# Patient Record
Sex: Female | Born: 1995 | Race: Black or African American | Hispanic: No | Smoking: Never smoker
Health system: Southern US, Community
[De-identification: ages and names within clinical notes are randomized; demographics above are authoritative.]

## PROBLEM LIST (undated history)

## (undated) DIAGNOSIS — D649 Anemia, unspecified: Secondary | ICD-10-CM

## (undated) DIAGNOSIS — Z113 Encounter for screening for infections with a predominantly sexual mode of transmission: Secondary | ICD-10-CM

## (undated) DIAGNOSIS — R102 Pelvic and perineal pain: Secondary | ICD-10-CM

## (undated) DIAGNOSIS — A749 Chlamydial infection, unspecified: Secondary | ICD-10-CM

## (undated) DIAGNOSIS — J020 Streptococcal pharyngitis: Secondary | ICD-10-CM

## (undated) DIAGNOSIS — R030 Elevated blood-pressure reading, without diagnosis of hypertension: Secondary | ICD-10-CM

## (undated) DIAGNOSIS — M67439 Ganglion, unspecified wrist: Secondary | ICD-10-CM

## (undated) DIAGNOSIS — O98819 Other maternal infectious and parasitic diseases complicating pregnancy, unspecified trimester: Secondary | ICD-10-CM

## (undated) DIAGNOSIS — Z3490 Encounter for supervision of normal pregnancy, unspecified, unspecified trimester: Secondary | ICD-10-CM

## (undated) DIAGNOSIS — N898 Other specified noninflammatory disorders of vagina: Secondary | ICD-10-CM

## (undated) DIAGNOSIS — B379 Candidiasis, unspecified: Secondary | ICD-10-CM

## (undated) DIAGNOSIS — J329 Chronic sinusitis, unspecified: Secondary | ICD-10-CM

## (undated) HISTORY — DX: Chronic sinusitis, unspecified: J32.9

## (undated) HISTORY — PX: NO PAST SURGERIES: SHX2092

## (undated) HISTORY — DX: Pelvic and perineal pain: R10.2

## (undated) HISTORY — DX: Elevated blood-pressure reading, without diagnosis of hypertension: R03.0

## (undated) HISTORY — DX: Ganglion, unspecified wrist: M67.439

## (undated) HISTORY — DX: Other specified noninflammatory disorders of vagina: N89.8

## (undated) HISTORY — DX: Other maternal infectious and parasitic diseases complicating pregnancy, unspecified trimester: O98.819

## (undated) HISTORY — DX: Chlamydial infection, unspecified: A74.9

## (undated) HISTORY — DX: Encounter for supervision of normal pregnancy, unspecified, unspecified trimester: Z34.90

## (undated) HISTORY — DX: Candidiasis, unspecified: B37.9

## (undated) HISTORY — DX: Encounter for screening for infections with a predominantly sexual mode of transmission: Z11.3

---
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
## 2013-01-13 ENCOUNTER — Other Ambulatory Visit: Payer: Self-pay | Admitting: Obstetrics & Gynecology

## 2013-01-13 DIAGNOSIS — O3680X0 Pregnancy with inconclusive fetal viability, not applicable or unspecified: Secondary | ICD-10-CM

## 2013-01-16 ENCOUNTER — Other Ambulatory Visit: Payer: Self-pay | Admitting: Obstetrics & Gynecology

## 2013-01-16 ENCOUNTER — Ambulatory Visit (INDEPENDENT_AMBULATORY_CARE_PROVIDER_SITE_OTHER): Payer: Medicaid Other

## 2013-01-16 DIAGNOSIS — Z1389 Encounter for screening for other disorder: Secondary | ICD-10-CM

## 2013-01-16 DIAGNOSIS — O0932 Supervision of pregnancy with insufficient antenatal care, second trimester: Secondary | ICD-10-CM

## 2013-01-16 DIAGNOSIS — O26849 Uterine size-date discrepancy, unspecified trimester: Secondary | ICD-10-CM

## 2013-01-16 DIAGNOSIS — O3680X0 Pregnancy with inconclusive fetal viability, not applicable or unspecified: Secondary | ICD-10-CM

## 2013-01-16 DIAGNOSIS — O0930 Supervision of pregnancy with insufficient antenatal care, unspecified trimester: Secondary | ICD-10-CM

## 2013-01-16 NOTE — Progress Notes (Signed)
U/S-active fetus, meas c/w 15+6wks EDD 07/04/2013, cx long and closed 4.2cm, bilateral adnexa WNL, post gr 0 plac, fluid WNL, complete anatomy at next visit

## 2013-01-20 ENCOUNTER — Emergency Department (HOSPITAL_COMMUNITY)
Admission: EM | Admit: 2013-01-20 | Discharge: 2013-01-20 | Disposition: A | Discharge status: Home / Self Care | Payer: Medicaid Other | Attending: Emergency Medicine | Admitting: Emergency Medicine

## 2013-01-20 ENCOUNTER — Encounter (HOSPITAL_COMMUNITY): Payer: Self-pay | Admitting: *Deleted

## 2013-01-20 ENCOUNTER — Telehealth: Payer: Self-pay | Admitting: Nurse Practitioner

## 2013-01-20 DIAGNOSIS — Z8739 Personal history of other diseases of the musculoskeletal system and connective tissue: Secondary | ICD-10-CM | POA: Insufficient documentation

## 2013-01-20 DIAGNOSIS — O9989 Other specified diseases and conditions complicating pregnancy, childbirth and the puerperium: Secondary | ICD-10-CM | POA: Insufficient documentation

## 2013-01-20 DIAGNOSIS — R1032 Left lower quadrant pain: Secondary | ICD-10-CM | POA: Insufficient documentation

## 2013-01-20 DIAGNOSIS — R109 Unspecified abdominal pain: Secondary | ICD-10-CM

## 2013-01-20 DIAGNOSIS — Z79899 Other long term (current) drug therapy: Secondary | ICD-10-CM | POA: Insufficient documentation

## 2013-01-20 DIAGNOSIS — Z87440 Personal history of urinary (tract) infections: Secondary | ICD-10-CM | POA: Insufficient documentation

## 2013-01-20 DIAGNOSIS — Z8669 Personal history of other diseases of the nervous system and sense organs: Secondary | ICD-10-CM | POA: Insufficient documentation

## 2013-01-20 LAB — URINALYSIS, ROUTINE W REFLEX MICROSCOPIC
Bilirubin Urine: NEGATIVE
Glucose, UA: NEGATIVE mg/dL
Hgb urine dipstick: NEGATIVE
Specific Gravity, Urine: 1.025 (ref 1.005–1.030)
pH: 6 (ref 5.0–8.0)

## 2013-01-20 LAB — URINE MICROSCOPIC-ADD ON

## 2013-01-20 NOTE — ED Notes (Signed)
Pt with lower abd pain since 0500 this morning, denies N/V/D, pt also [redacted] weeks pregnant, denies burning on urination or vaginal bleeding/ discharge

## 2013-01-20 NOTE — ED Provider Notes (Signed)
CSN: [id number]     Arrival date & time 01/20/13  2024 History  This chart was scribed for Benny Lennert, MD by Ronal Fear, ED Scribe. This patient was seen in room APA12/APA12 and the patient's care was started at 9:52 PM.      Chief Complaint  Patient presents with  . Abdominal Pain    Patient is a 17 y.o. female presenting with abdominal pain. The history is provided by the patient. No language interpreter was used.  Abdominal Pain Pain location:  LLQ Pain radiates to:  Does not radiate Pain severity:  Mild Onset quality:  Sudden Duration:  16 hours Timing:  Constant Progression:  Unchanged Chronicity:  New Context: awakening from sleep   Relieved by:  None tried Worsened by:  Nothing tried Ineffective treatments:  None tried Associated symptoms: no chest pain, no cough, no diarrhea, no dysuria, no fatigue, no hematuria, no nausea, no vaginal bleeding, no vaginal discharge and no vomiting   Risk factors: pregnancy    HPI Comments: Gayna Braddy is a 17 y.o. female who presents to the Emergency Department complaining of gradually worsening, constant, moderate LLQ abdominal pain onset at 5 am, about 17 hours ago. Pt reports that she is [redacted] weeks pregnant, and she states that she came to the ED to ensure that he pregnancy is normal. She expresses that she has a history of UTIs. She denies nausea, vomiting, diarrhea, dysuria, vaginal bleeding and vaginal discharge.  PCP: Rudi Heap   Past Medical History  Diagnosis Date  . Neuromuscular disorder   . Ganglion cyst of wrist     left   History reviewed. No pertinent past surgical history. History reviewed. No pertinent family history. History  Substance Use Topics  . Smoking status: Never Smoker   . Smokeless tobacco: Not on file  . Alcohol Use: No   OB History   Grav Para Term Preterm Abortions TAB SAB Ect Mult Living   1              Review of Systems  Constitutional: Negative for appetite change and fatigue.   HENT: Negative for congestion, sinus pressure and ear discharge.   Eyes: Negative for discharge.  Respiratory: Negative for cough.   Cardiovascular: Negative for chest pain.  Gastrointestinal: Positive for abdominal pain. Negative for nausea, vomiting and diarrhea.  Genitourinary: Negative for dysuria, frequency, hematuria, vaginal bleeding and vaginal discharge.  Musculoskeletal: Negative for back pain.  Skin: Negative for rash.  Neurological: Negative for seizures and headaches.  Psychiatric/Behavioral: Negative for hallucinations.    Allergies  Review of patient's allergies indicates no known allergies.  Home Medications   Current Outpatient Rx  Name  Route  Sig  Dispense  Refill  . Prenatal Vit-Fe Fumarate-FA (PRENATAL VITAMINS PLUS) 27-1 MG TABS   Oral   Take 1 tablet by mouth daily.   30 tablet   11    Triage Vitals: BP 115/58  Pulse 92  Temp(Src) 98.6 F (37 C) (Oral)  Resp 16  Ht 5\' 3"  (1.6 m)  Wt 145 lb (65.772 kg)  BMI 25.69 kg/m2  SpO2 100%  LMP 10/08/2012  Physical Exam  Nursing note and vitals reviewed. Constitutional: She is oriented to person, place, and time. She appears well-developed and well-nourished. No distress.  HENT:  Head: Normocephalic and atraumatic.  Eyes: EOM are normal.  Neck: Neck supple. No tracheal deviation present.  Cardiovascular: Normal rate.   Pulmonary/Chest: Effort normal. No respiratory distress.  Abdominal: There is  tenderness (minimal LLQ tenderness).  Musculoskeletal: Normal range of motion.  Neurological: She is alert and oriented to person, place, and time.  Skin: Skin is warm and dry.  Psychiatric: She has a normal mood and affect. Her behavior is normal.    ED Course  Procedures (including critical care time)  DIAGNOSTIC STUDIES: Oxygen Saturation is 100% on RA, normal by my interpretation.    COORDINATION OF CARE: 9:56 PM- Pt advised of plan for treatment including UA and pt agrees.   Labs Review Labs  Reviewed  URINALYSIS, ROUTINE W REFLEX MICROSCOPIC - Abnormal; Notable for the following:    Leukocytes, UA TRACE (*)    All other components within normal limits  URINE MICROSCOPIC-ADD ON - Abnormal; Notable for the following:    Squamous Epithelial / LPF FEW (*)    Bacteria, UA FEW (*)    All other components within normal limits   Imaging Review No results found. Pt with nl [place] by md in office.   MDM  No diagnosis found. abd pain with preg.  Nl [place] in office.  Pt to follow up with ob this week.   Pain improved by discharge  The chart was scribed for me under my direct supervision.  I personally performed the history, physical, and medical decision making and all procedures in the evaluation of this patient.Benny Lennert, MD 01/20/13 [phone]

## 2013-01-20 NOTE — ED Notes (Signed)
Pt states history of UTI. Complaining of lower abdominal pain. FHR 140

## 2013-01-20 NOTE — ED Notes (Signed)
Pt alert & oriented x4, stable gait. Patient given discharge instructions, paperwork & prescription(s). Patient  instructed to stop at the registration desk to finish any additional paperwork. Patient verbalized understanding. Pt left department w/ no further questions. 

## 2013-01-22 LAB — URINE CULTURE

## 2013-01-28 ENCOUNTER — Encounter: Payer: Medicaid Other | Admitting: Advanced Practice Midwife

## 2013-01-28 ENCOUNTER — Other Ambulatory Visit: Payer: Medicaid Other

## 2013-01-28 NOTE — Telephone Encounter (Signed)
No call back from 8-26. Patient will contact office if appt still needed

## 2013-03-10 ENCOUNTER — Encounter: Payer: Self-pay | Admitting: Women's Health

## 2013-03-10 ENCOUNTER — Ambulatory Visit (INDEPENDENT_AMBULATORY_CARE_PROVIDER_SITE_OTHER): Payer: Medicaid Other | Admitting: Women's Health

## 2013-03-10 VITALS — BP 124/50 | Wt 160.0 lb

## 2013-03-10 DIAGNOSIS — Z1389 Encounter for screening for other disorder: Secondary | ICD-10-CM

## 2013-03-10 DIAGNOSIS — O99891 Other specified diseases and conditions complicating pregnancy: Secondary | ICD-10-CM

## 2013-03-10 DIAGNOSIS — Z3400 Encounter for supervision of normal first pregnancy, unspecified trimester: Secondary | ICD-10-CM | POA: Insufficient documentation

## 2013-03-10 DIAGNOSIS — O0930 Supervision of pregnancy with insufficient antenatal care, unspecified trimester: Secondary | ICD-10-CM

## 2013-03-10 DIAGNOSIS — Z3402 Encounter for supervision of normal first pregnancy, second trimester: Secondary | ICD-10-CM

## 2013-03-10 DIAGNOSIS — O0932 Supervision of pregnancy with insufficient antenatal care, second trimester: Secondary | ICD-10-CM

## 2013-03-10 DIAGNOSIS — R350 Frequency of micturition: Secondary | ICD-10-CM

## 2013-03-10 DIAGNOSIS — Z331 Pregnant state, incidental: Secondary | ICD-10-CM

## 2013-03-10 DIAGNOSIS — O2390 Unspecified genitourinary tract infection in pregnancy, unspecified trimester: Secondary | ICD-10-CM

## 2013-03-10 NOTE — Patient Instructions (Addendum)
You will have your sugar test next visit.  Please do not eat or drink anything after midnight the night before you come, not even water.  You will be here for at least two hours.    Pregnancy - Second Trimester The second trimester of pregnancy (3 to 6 months) is a period of rapid growth for you and your baby. At the end of the sixth month, your baby is about 9 inches long and weighs 1 1/2 pounds. You will begin to feel the baby move between 18 and 20 weeks of the pregnancy. This is called quickening. Weight gain is faster. A clear fluid (colostrum) may leak out of your breasts. You may feel small contractions of the womb (uterus). This is known as false labor or Braxton-Hicks contractions. This is like a practice for labor when the baby is ready to be born. Usually, the problems with morning sickness have usually passed by the end of your first trimester. Some women develop small dark blotches (called cholasma, mask of pregnancy) on their face that usually goes away after the baby is born. Exposure to the sun makes the blotches worse. Acne may also develop in some pregnant women and pregnant women who have acne, may find that it goes away. PRENATAL EXAMS  Blood work may continue to be done during prenatal exams. These tests are done to check on your health and the probable health of your baby. Blood work is used to follow your blood levels (hemoglobin). Anemia (low hemoglobin) is common during pregnancy. Iron and vitamins are given to help prevent this. You will also be checked for diabetes between 24 and 28 weeks of the pregnancy. Some of the previous blood tests may be repeated.  The size of the uterus is measured during each visit. This is to make sure that the baby is continuing to grow properly according to the dates of the pregnancy.  Your blood pressure is checked every prenatal visit. This is to make sure you are not getting toxemia.  Your urine is checked to make sure you do not have an  infection, diabetes or protein in the urine.  Your weight is checked often to make sure gains are happening at the suggested rate. This is to ensure that both you and your baby are growing normally.  Sometimes, an ultrasound is performed to confirm the proper growth and development of the baby. This is a test which bounces harmless sound waves off the baby so your caregiver can more accurately determine due dates. Sometimes, a test is done on the amniotic fluid surrounding the baby. This test is called an amniocentesis. The amniotic fluid is obtained by sticking a needle into the belly (abdomen). This is done to check the chromosomes in instances where there is a concern about possible genetic problems with the baby. It is also sometimes done near the end of pregnancy if an early delivery is required. In this case, it is done to help make sure the baby's lungs are mature enough for the baby to live outside of the womb. CHANGES OCCURING IN THE SECOND TRIMESTER OF PREGNANCY Your body goes through many changes during pregnancy. They vary from person to person. Talk to your caregiver about changes you notice that you are concerned about.  During the second trimester, you will likely have an increase in your appetite. It is normal to have cravings for certain foods. This varies from person to person and pregnancy to pregnancy.  Your lower abdomen will begin to   bulge.  You may have to urinate more often because the uterus and baby are pressing on your bladder. It is also common to get more bladder infections during pregnancy. You can help this by drinking lots of fluids and emptying your bladder before and after intercourse.  You may begin to get stretch marks on your hips, abdomen, and breasts. These are normal changes in the body during pregnancy. There are no exercises or medicines to take that prevent this change.  You may begin to develop swollen and bulging veins (varicose veins) in your legs.  Wearing support hose, elevating your feet for 15 minutes, 3 to 4 times a day and limiting salt in your diet helps lessen the problem.  Heartburn may develop as the uterus grows and pushes up against the stomach. Antacids recommended by your caregiver helps with this problem. Also, eating smaller meals 4 to 5 times a day helps.  Constipation can be treated with a stool softener or adding bulk to your diet. Drinking lots of fluids, and eating vegetables, fruits, and whole grains are helpful.  Exercising is also helpful. If you have been very active up until your pregnancy, most of these activities can be continued during your pregnancy. If you have been less active, it is helpful to start an exercise program such as walking.  Hemorrhoids may develop at the end of the second trimester. Warm sitz baths and hemorrhoid cream recommended by your caregiver helps hemorrhoid problems.  Backaches may develop during this time of your pregnancy. Avoid heavy lifting, wear low heal shoes, and practice good posture to help with backache problems.  Some pregnant women develop tingling and numbness of their hand and fingers because of swelling and tightening of ligaments in the wrist (carpel tunnel syndrome). This goes away after the baby is born.  As your breasts enlarge, you may have to get a bigger bra. Get a comfortable, cotton, support bra. Do not get a nursing bra until the last month of the pregnancy if you will be nursing the baby.  You may get a dark line from your belly button to the pubic area called the linea nigra.  You may develop rosy cheeks because of increase blood flow to the face.  You may develop spider looking lines of the face, neck, arms, and chest. These go away after the baby is born. HOME CARE INSTRUCTIONS   It is extremely important to avoid all smoking, herbs, alcohol, and unprescribed drugs during your pregnancy. These chemicals affect the formation and growth of the baby. Avoid  these chemicals throughout the pregnancy to ensure the delivery of a healthy infant.  Most of your home care instructions are the same as suggested for the first trimester of your pregnancy. Keep your caregiver's appointments. Follow your caregiver's instructions regarding medicine use, exercise, and diet.  During pregnancy, you are providing food for you and your baby. Continue to eat regular, well-balanced meals. Choose foods such as meat, fish, milk and other low fat dairy products, vegetables, fruits, and whole-grain breads and cereals. Your caregiver will tell you of the ideal weight gain.  A physical sexual relationship may be continued up until near the end of pregnancy if there are no other problems. Problems could include early (premature) leaking of amniotic fluid from the membranes, vaginal bleeding, abdominal pain, or other medical or pregnancy problems.  Exercise regularly if there are no restrictions. Check with your caregiver if you are unsure of the safety of some of your exercises. The   greatest weight gain will occur in the last 2 trimesters of pregnancy. Exercise will help you:  Control your weight.  Get you in shape for labor and delivery.  Lose weight after you have the baby.  Wear a good support or jogging bra for breast tenderness during pregnancy. This may help if worn during sleep. Pads or tissues may be used in the bra if you are leaking colostrum.  Do not use hot tubs, steam rooms or saunas throughout the pregnancy.  Wear your seat belt at all times when driving. This protects you and your baby if you are in an accident.  Avoid raw meat, uncooked cheese, cat litter boxes, and soil used by cats. These carry germs that can cause birth defects in the baby.  The second trimester is also a good time to visit your dentist for your dental health if this has not been done yet. Getting your teeth cleaned is okay. Use a soft toothbrush. Brush gently during pregnancy.  It is  easier to leak urine during pregnancy. Tightening up and strengthening the pelvic muscles will help with this problem. Practice stopping your urination while you are going to the bathroom. These are the same muscles you need to strengthen. It is also the muscles you would use as if you were trying to stop from passing gas. You can practice tightening these muscles up 10 times a set and repeating this about 3 times per day. Once you know what muscles to tighten up, do not perform these exercises during urination. It is more likely to contribute to an infection by backing up the urine.  Ask for help if you have financial, counseling, or nutritional needs during pregnancy. Your caregiver will be able to offer counseling for these needs as well as refer you for other special needs.  Your skin may become oily. If so, wash your face with mild soap, use non-greasy moisturizer and oil or cream based makeup. MEDICINES AND DRUG USE IN PREGNANCY  Take prenatal vitamins as directed. The vitamin should contain 1 milligram of folic acid. Keep all vitamins out of reach of children. Only a couple vitamins or tablets containing iron may be fatal to a baby or young child when ingested.  Avoid use of all medicines, including herbs, over-the-counter medicines, not prescribed or suggested by your caregiver. Only take over-the-counter or prescription medicines for pain, discomfort, or fever as directed by your caregiver. Do not use aspirin.  Let your caregiver also know about herbs you may be using.  Alcohol is related to a number of birth defects. This includes fetal alcohol syndrome. All alcohol, in any form, should be avoided completely. Smoking will cause low birth rate and premature babies.  Street or illegal drugs are very harmful to the baby. They are absolutely forbidden. A baby born to an addicted mother will be addicted at birth. The baby will go through the same withdrawal an adult does. SEEK MEDICAL CARE IF:    You have any concerns or worries during your pregnancy. It is better to call with your questions if you feel they cannot wait, rather than worry about them. SEEK IMMEDIATE MEDICAL CARE IF:   An unexplained oral temperature above 102 F (38.9 C) develops, or as your caregiver suggests.  You have leaking of fluid from the vagina (birth canal). If leaking membranes are suspected, take your temperature and tell your caregiver of this when you call.  There is vaginal spotting, bleeding, or passing clots. Tell your caregiver of   the amount and how many pads are used. Light spotting in pregnancy is common, especially following intercourse.  You develop a bad smelling vaginal discharge with a change in the color from clear to white.  You continue to feel sick to your stomach (nauseated) and have no relief from remedies suggested. You vomit blood or coffee ground-like materials.  You lose more than 2 pounds of weight or gain more than 2 pounds of weight over 1 week, or as suggested by your caregiver.  You notice swelling of your face, hands, feet, or legs.  You get exposed to German measles and have never had them.  You are exposed to fifth disease or chickenpox.  You develop belly (abdominal) pain. Round ligament discomfort is a common non-cancerous (benign) cause of abdominal pain in pregnancy. Your caregiver still must evaluate you.  You develop a bad headache that does not go away.  You develop fever, diarrhea, pain with urination, or shortness of breath.  You develop visual problems, blurry, or double vision.  You fall or are in a car accident or any kind of trauma.  There is mental or physical violence at home. Document Released: 05/08/2001 Document Revised: 02/06/2012 Document Reviewed: 11/10/2008 Exit[employer] Patient Information 2014 [employer].  

## 2013-03-10 NOTE — Progress Notes (Signed)
Pt states thinks she has UTI, urgency.

## 2013-03-10 NOTE — Addendum Note (Signed)
Addended by: Cheral Marker on: 03/10/2013 05:35 PM   Modules accepted: Orders

## 2013-03-10 NOTE — Progress Notes (Signed)
  Subjective:    Erica [name] is a 17 y.o. G1P0 African American female at [redacted]w[redacted]d by 15.6wk u/s, being seen today for her first obstetrical visit.  She initiated pnc in [place] @ 8wks, then came here at 15.6wks for u/s, and has not had care since. Her obstetrical history is significant for primigravida.  Pregnancy history fully reviewed. Has not had anatomy u/s.   Patient reports feeling like she may have a UTI, some urgency, frequency. Denies dysuria, fever/chills, n/v.   Filed Vitals:   03/10/13 1548  BP: 124/50  Weight: 160 lb (72.576 kg)    HISTORY: OB History  Gravida Para Term Preterm AB SAB TAB Ectopic Multiple Living  1             # Outcome Date GA Lbr Len/2nd Weight Sex Delivery Anes PTL Lv  1 CUR              Past Medical History  Diagnosis Date  . Neuromuscular disorder   . Ganglion cyst of wrist     left   Past Surgical History  Procedure Laterality Date  . No past surgeries     Family History  Problem Relation Age of Onset  . Diabetes Maternal Grandmother      Exam   System:     Skin: normal coloration and turgor, no rashes    Neurologic: oriented, normal mood   Extremities: normal strength, tone, and muscle mass   HEENT PERRLA   Mouth/Teeth mucous membranes moist   Cardiovascular: regular rate and rhythm   Respiratory:  appears well, vitals normal, no respiratory distress, acyanotic, normal RR   Abdomen: soft, non-tender    Thin prep pap smear not obtained d/t age    Assessment:    Pregnancy: G1P0 Patient Active Problem List   Diagnosis Date Noted  . Supervision of normal first pregnancy 03/10/2013    Priority: High      [redacted]w[redacted]d G1P0 New OB visit GBS+urine early pregnancy Insufficient pnc    Plan:     Initial labs drawn Continue prenatal vitamins Problem list reviewed and updated Reviewed n/v relief measures and warning s/s to report Reviewed recommended weight gain based on pre-gravid BMI Encouraged well-balanced  diet Genetic Screening discussed Quad Screen: too late Cystic fibrosis screening discussed will look through [facility] prenatals when they arrive to see if she has had Ultrasound discussed; fetal survey: ordered Follow up asap for anatomy u/s, then 4wks for pn2 and visit Will send urine for culture  Marge Duncans 03/10/2013 5:20 PM

## 2013-03-11 LAB — POCT URINALYSIS DIPSTICK
Ketones, UA: NEGATIVE
Leukocytes, UA: NEGATIVE
Protein, UA: NEGATIVE

## 2013-03-12 ENCOUNTER — Ambulatory Visit (INDEPENDENT_AMBULATORY_CARE_PROVIDER_SITE_OTHER): Payer: Medicaid Other

## 2013-03-12 DIAGNOSIS — O0932 Supervision of pregnancy with insufficient antenatal care, second trimester: Secondary | ICD-10-CM

## 2013-03-12 DIAGNOSIS — Z3402 Encounter for supervision of normal first pregnancy, second trimester: Secondary | ICD-10-CM

## 2013-03-12 DIAGNOSIS — O0930 Supervision of pregnancy with insufficient antenatal care, unspecified trimester: Secondary | ICD-10-CM

## 2013-03-12 DIAGNOSIS — Z1389 Encounter for screening for other disorder: Secondary | ICD-10-CM

## 2013-03-12 LAB — URINE CULTURE: Colony Count: NO GROWTH

## 2013-03-12 NOTE — Progress Notes (Signed)
U/S(23+5wks)-active fetus, meas c/w dates, fluid wnl, cx long and closed, bilateral adnexa wnl, no major abnl noted, female fetus, posterior Gr 0 placenta, [id number]

## 2013-03-16 ENCOUNTER — Telehealth: Payer: Self-pay | Admitting: Obstetrics and Gynecology

## 2013-03-16 NOTE — Telephone Encounter (Signed)
Spoke with pt. Has a white discharge, urinary frequency, urinary urgency, and vaginal dryness. No burning with urination. Appt scheduled tomorrow for eval. JSY

## 2013-03-17 ENCOUNTER — Encounter: Payer: Self-pay | Admitting: Women's Health

## 2013-03-17 ENCOUNTER — Ambulatory Visit (INDEPENDENT_AMBULATORY_CARE_PROVIDER_SITE_OTHER): Payer: Medicaid Other | Admitting: Women's Health

## 2013-03-17 VITALS — BP 110/52 | Wt 162.0 lb

## 2013-03-17 DIAGNOSIS — N898 Other specified noninflammatory disorders of vagina: Secondary | ICD-10-CM

## 2013-03-17 DIAGNOSIS — D573 Sickle-cell trait: Secondary | ICD-10-CM | POA: Insufficient documentation

## 2013-03-17 DIAGNOSIS — O9989 Other specified diseases and conditions complicating pregnancy, childbirth and the puerperium: Secondary | ICD-10-CM

## 2013-03-17 DIAGNOSIS — B373 Candidiasis of vulva and vagina: Secondary | ICD-10-CM

## 2013-03-17 DIAGNOSIS — O99891 Other specified diseases and conditions complicating pregnancy: Secondary | ICD-10-CM

## 2013-03-17 DIAGNOSIS — O0930 Supervision of pregnancy with insufficient antenatal care, unspecified trimester: Secondary | ICD-10-CM

## 2013-03-17 DIAGNOSIS — Z331 Pregnant state, incidental: Secondary | ICD-10-CM

## 2013-03-17 DIAGNOSIS — Z3402 Encounter for supervision of normal first pregnancy, second trimester: Secondary | ICD-10-CM

## 2013-03-17 DIAGNOSIS — O2390 Unspecified genitourinary tract infection in pregnancy, unspecified trimester: Secondary | ICD-10-CM

## 2013-03-17 DIAGNOSIS — O26892 Other specified pregnancy related conditions, second trimester: Secondary | ICD-10-CM

## 2013-03-17 DIAGNOSIS — B3731 Acute candidiasis of vulva and vagina: Secondary | ICD-10-CM

## 2013-03-17 DIAGNOSIS — Z1389 Encounter for screening for other disorder: Secondary | ICD-10-CM

## 2013-03-17 LAB — POCT URINALYSIS DIPSTICK
Blood, UA: NEGATIVE
Glucose, UA: NEGATIVE
Ketones, UA: NEGATIVE
Protein, UA: NEGATIVE

## 2013-03-17 LAB — POCT WET PREP (WET MOUNT): Clue Cells Wet Prep Whiff POC: NEGATIVE

## 2013-03-17 MED ORDER — FLUCONAZOLE 150 MG PO TABS: 150.0000 mg | ORAL_TABLET | Freq: Once | ORAL | Status: DC

## 2013-03-17 NOTE — Patient Instructions (Signed)
Monilial Vaginitis  Vaginitis in a soreness, swelling and redness (inflammation) of the vagina and vulva. Monilial vaginitis is not a sexually transmitted infection.  CAUSES   Yeast vaginitis is caused by yeast (candida) that is normally found in your vagina. With a yeast infection, the candida has overgrown in number to a point that upsets the chemical balance.  SYMPTOMS   · White, thick vaginal discharge.  · Swelling, itching, redness and irritation of the vagina and possibly the lips of the vagina (vulva).  · Burning or painful urination.  · Painful intercourse.  DIAGNOSIS   Things that may contribute to monilial vaginitis are:  · Postmenopausal and virginal states.  · Pregnancy.  · Infections.  · Being tired, sick or stressed, especially if you had monilial vaginitis in the past.  · Diabetes. Good control will help lower the chance.  · Birth control pills.  · Tight fitting garments.  · Using bubble bath, feminine sprays, douches or deodorant tampons.  · Taking certain medications that kill germs (antibiotics).  · Sporadic recurrence can occur if you become ill.  TREATMENT   Your caregiver will give you medication.  · There are several kinds of anti monilial vaginal creams and suppositories specific for monilial vaginitis. For recurrent yeast infections, use a suppository or cream in the vagina 2 times a week, or as directed.  · Anti-monilial or steroid cream for the itching or irritation of the vulva may also be used. Get your caregiver's permission.  · Painting the vagina with methylene blue solution may help if the monilial cream does not work.  · Eating yogurt may help prevent monilial vaginitis.  HOME CARE INSTRUCTIONS   · Finish all medication as prescribed.  · Do not have sex until treatment is completed or after your caregiver tells you it is okay.  · Take warm sitz baths.  · Do not douche.  · Do not use tampons, especially scented ones.  · Wear cotton underwear.  · Avoid tight pants and panty  hose.  · Tell your sexual partner that you have a yeast infection. They should go to their caregiver if they have symptoms such as mild rash or itching.  · Your sexual partner should be treated as well if your infection is difficult to eliminate.  · Practice safer sex. Use condoms.  · Some vaginal medications cause latex condoms to fail. Vaginal medications that harm condoms are:  · Cleocin cream.  · Butoconazole (Femstat®).  · Terconazole (Terazol®) vaginal suppository.  · Miconazole (Monistat®) (may be purchased over the counter).  SEEK MEDICAL CARE IF:   · You have a temperature by mouth above 102° F (38.9° C).  · The infection is getting worse after 2 days of treatment.  · The infection is not getting better after 3 days of treatment.  · You develop blisters in or around your vagina.  · You develop vaginal bleeding, and it is not your menstrual period.  · You have pain when you urinate.  · You develop intestinal problems.  · You have pain with sexual intercourse.  Document Released: 02/21/2005 Document Revised: 08/06/2011 Document Reviewed: 11/05/2008  Exit[employer]® Patient Information ©2014 [employer].

## 2013-03-17 NOTE — Progress Notes (Signed)
Patient ID: Erica [name], female   DOB: May 01, 1996, 17 y.o.   MRN: [id number] [name] wanted me to call [facility] to get some labs on pt they had sent some records but no labs/ LMOM with Medical Records to send any if possible/AMP

## 2013-03-17 NOTE — Progress Notes (Signed)
Work-in: Reports good fm. Denies uc's, lof, vb, urinary frequency, urgency, hesitancy, or dysuria.  Vaginal dryness and itching, as well as malodorous white clumpy d/c. Spec exam: large amount nonodorous white clumpy d/c. Wet prep+yeast. Rx diflucan. Reviewed u/s results from the other day, ptl s/s, fm.  [facility] and [facility] referral completed. All questions answered. F/U as scheduled.

## 2013-03-26 ENCOUNTER — Encounter: Payer: Self-pay | Admitting: *Deleted

## 2013-04-07 ENCOUNTER — Ambulatory Visit (INDEPENDENT_AMBULATORY_CARE_PROVIDER_SITE_OTHER): Payer: Medicaid Other | Admitting: Adult Health

## 2013-04-07 ENCOUNTER — Ambulatory Visit: Payer: Medicaid Other | Admitting: Adult Health

## 2013-04-07 ENCOUNTER — Encounter: Payer: Self-pay | Admitting: Adult Health

## 2013-04-07 VITALS — BP 122/72 | Wt 162.2 lb

## 2013-04-07 DIAGNOSIS — B373 Candidiasis of vulva and vagina: Secondary | ICD-10-CM

## 2013-04-07 DIAGNOSIS — Z1389 Encounter for screening for other disorder: Secondary | ICD-10-CM

## 2013-04-07 DIAGNOSIS — Z331 Pregnant state, incidental: Secondary | ICD-10-CM

## 2013-04-07 DIAGNOSIS — O2390 Unspecified genitourinary tract infection in pregnancy, unspecified trimester: Secondary | ICD-10-CM

## 2013-04-07 DIAGNOSIS — O99891 Other specified diseases and conditions complicating pregnancy: Secondary | ICD-10-CM

## 2013-04-07 DIAGNOSIS — Z3400 Encounter for supervision of normal first pregnancy, unspecified trimester: Secondary | ICD-10-CM

## 2013-04-07 DIAGNOSIS — B3731 Acute candidiasis of vulva and vagina: Secondary | ICD-10-CM

## 2013-04-07 DIAGNOSIS — Z3490 Encounter for supervision of normal pregnancy, unspecified, unspecified trimester: Secondary | ICD-10-CM

## 2013-04-07 DIAGNOSIS — O99019 Anemia complicating pregnancy, unspecified trimester: Secondary | ICD-10-CM

## 2013-04-07 DIAGNOSIS — B379 Candidiasis, unspecified: Secondary | ICD-10-CM

## 2013-04-07 HISTORY — DX: Candidiasis, unspecified: B37.9

## 2013-04-07 LAB — POCT WET PREP (WET MOUNT)

## 2013-04-07 LAB — CBC
Platelets: 270 10*3/uL (ref 150–400)
RBC: 3.88 MIL/uL (ref 3.87–5.11)
WBC: 6.4 10*3/uL (ref 4.0–10.5)

## 2013-04-07 LAB — POCT URINALYSIS DIPSTICK
Blood, UA: NEGATIVE
Ketones, UA: NEGATIVE

## 2013-04-07 MED ORDER — FLUCONAZOLE 150 MG PO TABS: 150.0000 mg | ORAL_TABLET | Freq: Once | ORAL | Status: DC

## 2013-04-07 NOTE — Patient Instructions (Signed)
Monilial Vaginitis Vaginitis in a soreness, swelling and redness (inflammation) of the vagina and vulva. Monilial vaginitis is not a sexually transmitted infection. CAUSES  Yeast vaginitis is caused by yeast (candida) that is normally found in your vagina. With a yeast infection, the candida has overgrown in number to a point that upsets the chemical balance. SYMPTOMS   White, thick vaginal discharge.  Swelling, itching, redness and irritation of the vagina and possibly the lips of the vagina (vulva).  Burning or painful urination.  Painful intercourse. DIAGNOSIS  Things that may contribute to monilial vaginitis are:  Postmenopausal and virginal states.  Pregnancy.  Infections.  Being tired, sick or stressed, especially if you had monilial vaginitis in the past.  Diabetes. Good control will help lower the chance.  Birth control pills.  Tight fitting garments.  Using bubble bath, feminine sprays, douches or deodorant tampons.  Taking certain medications that kill germs (antibiotics).  Sporadic recurrence can occur if you become ill. TREATMENT  Your caregiver will give you medication.  There are several kinds of anti monilial vaginal creams and suppositories specific for monilial vaginitis. For recurrent yeast infections, use a suppository or cream in the vagina 2 times a week, or as directed.  Anti-monilial or steroid cream for the itching or irritation of the vulva may also be used. Get your caregiver's permission.  Painting the vagina with methylene blue solution may help if the monilial cream does not work.  Eating yogurt may help prevent monilial vaginitis. HOME CARE INSTRUCTIONS   Finish all medication as prescribed.  Do not have sex until treatment is completed or after your caregiver tells you it is okay.  Take warm sitz baths.  Do not douche.  Do not use tampons, especially scented ones.  Wear cotton underwear.  Avoid tight pants and panty  hose.  Tell your sexual partner that you have a yeast infection. They should go to their caregiver if they have symptoms such as mild rash or itching.  Your sexual partner should be treated as well if your infection is difficult to eliminate.  Practice safer sex. Use condoms.  Some vaginal medications cause latex condoms to fail. Vaginal medications that harm condoms are:  Cleocin cream.  Butoconazole (Femstat).  Terconazole (Terazol) vaginal suppository.  Miconazole (Monistat) (may be purchased over the counter). SEEK MEDICAL CARE IF:   You have a temperature by mouth above 102 F (38.9 C).  The infection is getting worse after 2 days of treatment.  The infection is not getting better after 3 days of treatment.  You develop blisters in or around your vagina.  You develop vaginal bleeding, and it is not your menstrual period.  You have pain when you urinate.  You develop intestinal problems.  You have pain with sexual intercourse. Document Released: 02/21/2005 Document Revised: 08/06/2011 Document Reviewed: 11/05/2008 [facility] Patient Information 2014 [place], [place]. Third Trimester of Pregnancy The third trimester is from week 29 through week 42, months 7 through 9. The third trimester is a time when the fetus is growing rapidly. At the end of the ninth month, the fetus is about 20 inches in length and weighs 6 10 pounds.  BODY CHANGES Your body goes through many changes during pregnancy. The changes vary from woman to woman.   Your weight will continue to increase. You can expect to gain 25 35 pounds (11 16 kg) by the end of the pregnancy.  You may begin to get stretch marks on your hips, abdomen, and breasts.  You may urinate more often because the fetus is moving lower into your pelvis and pressing on your bladder.  You may develop or continue to have heartburn as a result of your pregnancy.  You may develop constipation because certain hormones are causing  the muscles that push waste through your intestines to slow down.  You may develop hemorrhoids or swollen, bulging veins (varicose veins).  You may have pelvic pain because of the weight gain and pregnancy hormones relaxing your joints between the bones in your pelvis. Back aches may result from over exertion of the muscles supporting your posture.  Your breasts will continue to grow and be tender. A yellow discharge may leak from your breasts called colostrum.  Your belly button may stick out.  You may feel short of breath because of your expanding uterus.  You may notice the fetus "dropping," or moving lower in your abdomen.  You may have a bloody mucus discharge. This usually occurs a few days to a week before labor begins.  Your cervix becomes thin and soft (effaced) near your due date. WHAT TO EXPECT AT YOUR PRENATAL EXAMS  You will have prenatal exams every 2 weeks until week 36. Then, you will have weekly prenatal exams. During a routine prenatal visit:  You will be weighed to make sure you and the fetus are growing normally.  Your blood pressure is taken.  Your abdomen will be measured to track your baby's growth.  The fetal heartbeat will be listened to.  Any test results from the previous visit will be discussed.  You may have a cervical check near your due date to see if you have effaced. At around 36 weeks, your caregiver will check your cervix. At the same time, your caregiver will also perform a test on the secretions of the vaginal tissue. This test is to determine if a type of bacteria, Group B streptococcus, is present. Your caregiver will explain this further. Your caregiver may ask you:  What your birth plan is.  How you are feeling.  If you are feeling the baby move.  If you have had any abnormal symptoms, such as leaking fluid, bleeding, severe headaches, or abdominal cramping.  If you have any questions. Other tests or screenings that may be performed  during your third trimester include:  Blood tests that check for low iron levels (anemia).  Fetal testing to check the health, activity level, and growth of the fetus. Testing is done if you have certain medical conditions or if there are problems during the pregnancy. FALSE LABOR You may feel small, irregular contractions that eventually go away. These are called Braxton Hicks contractions, or false labor. Contractions may last for hours, days, or even weeks before true labor sets in. If contractions come at regular intervals, intensify, or become painful, it is best to be seen by your caregiver.  SIGNS OF LABOR   Menstrual-like cramps.  Contractions that are 5 minutes apart or less.  Contractions that start on the top of the uterus and spread down to the lower abdomen and back.  A sense of increased pelvic pressure or back pain.  A watery or bloody mucus discharge that comes from the vagina. If you have any of these signs before the 37th week of pregnancy, call your caregiver right away. You need to go to the hospital to get checked immediately. HOME CARE INSTRUCTIONS   Avoid all smoking, herbs, alcohol, and unprescribed drugs. These chemicals affect the formation and growth of  the baby.  Follow your caregiver's instructions regarding medicine use. There are medicines that are either safe or unsafe to take during pregnancy.  Exercise only as directed by your caregiver. Experiencing uterine cramps is a good sign to stop exercising.  Continue to eat regular, healthy meals.  Wear a good support bra for breast tenderness.  Do not use hot tubs, steam rooms, or saunas.  Wear your seat belt at all times when driving.  Avoid raw meat, uncooked cheese, cat litter boxes, and soil used by cats. These carry germs that can cause birth defects in the baby.  Take your prenatal vitamins.  Try taking a stool softener (if your caregiver approves) if you develop constipation. Eat more  high-fiber foods, such as fresh vegetables or fruit and whole grains. Drink plenty of fluids to keep your urine clear or pale yellow.  Take warm sitz baths to soothe any pain or discomfort caused by hemorrhoids. Use hemorrhoid cream if your caregiver approves.  If you develop varicose veins, wear support hose. Elevate your feet for 15 minutes, 3 4 times a day. Limit salt in your diet.  Avoid heavy lifting, wear low heal shoes, and practice good posture.  Rest a lot with your legs elevated if you have leg cramps or low back pain.  Visit your dentist if you have not gone during your pregnancy. Use a soft toothbrush to brush your teeth and be gentle when you floss.  A sexual relationship may be continued unless your caregiver directs you otherwise.  Do not travel far distances unless it is absolutely necessary and only with the approval of your caregiver.  Take prenatal classes to understand, practice, and ask questions about the labor and delivery.  Make a trial run to the hospital.  Pack your hospital bag.  Prepare the baby's nursery.  Continue to go to all your prenatal visits as directed by your caregiver. SEEK MEDICAL CARE IF:  You are unsure if you are in labor or if your water has broken.  You have dizziness.  You have mild pelvic cramps, pelvic pressure, or nagging pain in your abdominal area.  You have persistent nausea, vomiting, or diarrhea.  You have a bad smelling vaginal discharge.  You have pain with urination. SEEK IMMEDIATE MEDICAL CARE IF:   You have a fever.  You are leaking fluid from your vagina.  You have spotting or bleeding from your vagina.  You have severe abdominal cramping or pain.  You have rapid weight loss or gain.  You have shortness of breath with chest pain.  You notice sudden or extreme swelling of your face, hands, ankles, feet, or legs.  You have not felt your baby move in over an hour.  You have severe headaches that do not go  away with medicine.  You have vision changes. Document Released: 05/08/2001 Document Revised: 01/14/2013 Document Reviewed: 07/15/2012 [facility] Patient Information 2014 [place], [place]. Follow up in 4 weeks Fetal Movement Counts Patient Name: __________________________________________________ Patient Due Date: ____________________ Performing a fetal movement count is highly recommended in high-risk pregnancies, but it is good for every pregnant woman to do. Your caregiver may ask you to start counting fetal movements at 28 weeks of the pregnancy. Fetal movements often increase:  After eating a full meal.  After physical activity.  After eating or drinking something sweet or cold.  At rest. Pay attention to when you feel the baby is most active. This will help you notice a pattern of your baby's sleep  and wake cycles and what factors contribute to an increase in fetal movement. It is important to perform a fetal movement count at the same time each day when your baby is normally most active.  HOW TO COUNT FETAL MOVEMENTS 1. Find a quiet and comfortable area to sit or lie down on your left side. Lying on your left side provides the best blood and oxygen circulation to your baby. 2. Write down the day and time on a sheet of paper or in a journal. 3. Start counting kicks, flutters, swishes, rolls, or jabs in a 2 hour period. You should feel at least 10 movements within 2 hours. 4. If you do not feel 10 movements in 2 hours, wait 2 3 hours and count again. Look for a change in the pattern or not enough counts in 2 hours. SEEK MEDICAL CARE IF:  You feel less than 10 counts in 2 hours, tried twice.  There is no movement in over an hour.  The pattern is changing or taking longer each day to reach 10 counts in 2 hours.  You feel the baby is not moving as he or she usually does. Date: ____________ Movements: ____________ Start time: ____________ Erica [name] time: ____________  Date: ____________  Movements: ____________ Start time: ____________ Erica [name] time: ____________ Date: ____________ Movements: ____________ Start time: ____________ Erica [name] time: ____________ Date: ____________ Movements: ____________ Start time: ____________ Erica [name] time: ____________ Date: ____________ Movements: ____________ Start time: ____________ Erica [name] time: ____________ Date: ____________ Movements: ____________ Start time: ____________ Erica [name] time: ____________ Date: ____________ Movements: ____________ Start time: ____________ Erica [name] time: ____________ Date: ____________ Movements: ____________ Start time: ____________ Erica [name] time: ____________  Date: ____________ Movements: ____________ Start time: ____________ Erica [name] time: ____________ Date: ____________ Movements: ____________ Start time: ____________ Erica [name] time: ____________ Date: ____________ Movements: ____________ Start time: ____________ Erica [name] time: ____________ Date: ____________ Movements: ____________ Start time: ____________ Erica [name] time: ____________ Date: ____________ Movements: ____________ Start time: ____________ Erica [name] time: ____________ Date: ____________ Movements: ____________ Start time: ____________ Erica [name] time: ____________ Date: ____________ Movements: ____________ Start time: ____________ Erica [name] time: ____________  Date: ____________ Movements: ____________ Start time: ____________ Erica [name] time: ____________ Date: ____________ Movements: ____________ Start time: ____________ Erica [name] time: ____________ Date: ____________ Movements: ____________ Start time: ____________ Erica [name] time: ____________ Date: ____________ Movements: ____________ Start time: ____________ Erica [name] time: ____________ Date: ____________ Movements: ____________ Start time: ____________ Erica [name] time: ____________ Date: ____________ Movements: ____________ Start time: ____________ Erica [name] time: ____________ Date: ____________ Movements: ____________ Start time:  ____________ Erica [name] time: ____________  Date: ____________ Movements: ____________ Start time: ____________ Erica [name] time: ____________ Date: ____________ Movements: ____________ Start time: ____________ Erica [name] time: ____________ Date: ____________ Movements: ____________ Start time: ____________ Erica [name] time: ____________ Date: ____________ Movements: ____________ Start time: ____________ Erica [name] time: ____________ Date: ____________ Movements: ____________ Start time: ____________ Erica [name] time: ____________ Date: ____________ Movements: ____________ Start time: ____________ Erica [name] time: ____________ Date: ____________ Movements: ____________ Start time: ____________ Erica [name] time: ____________  Date: ____________ Movements: ____________ Start time: ____________ Erica [name] time: ____________ Date: ____________ Movements: ____________ Start time: ____________ Erica [name] time: ____________ Date: ____________ Movements: ____________ Start time: ____________ Erica [name] time: ____________ Date: ____________ Movements: ____________ Start time: ____________ Erica [name] time: ____________ Date: ____________ Movements: ____________ Start time: ____________ Erica [name] time: ____________ Date: ____________ Movements: ____________ Start time: ____________ Erica [name] time: ____________ Date: ____________ Movements: ____________ Start time: ____________ Erica [name] time: ____________  Date: ____________ Movements: ____________ Start time: ____________ Erica [name] time: ____________ Date: ____________ Movements: ____________ Start time: ____________ Erica [name] time: ____________ Date: ____________ Movements: ____________ Start time: ____________ Erica [name] time: ____________ Date: ____________  Movements: ____________ Start time: ____________ Erica [name] time: ____________ Date: ____________ Movements: ____________ Start time: ____________ Erica [name] time: ____________ Date: ____________ Movements: ____________ Start time: ____________ Erica [name] time: ____________ Date:  ____________ Movements: ____________ Start time: ____________ Erica [name] time: ____________  Date: ____________ Movements: ____________ Start time: ____________ Erica [name] time: ____________ Date: ____________ Movements: ____________ Start time: ____________ Erica [name] time: ____________ Date: ____________ Movements: ____________ Start time: ____________ Erica [name] time: ____________ Date: ____________ Movements: ____________ Start time: ____________ Erica [name] time: ____________ Date: ____________ Movements: ____________ Start time: ____________ Erica [name] time: ____________ Date: ____________ Movements: ____________ Start time: ____________ Erica [name] time: ____________ Date: ____________ Movements: ____________ Start time: ____________ Erica [name] time: ____________  Date: ____________ Movements: ____________ Start time: ____________ Erica [name] time: ____________ Date: ____________ Movements: ____________ Start time: ____________ Erica [name] time: ____________ Date: ____________ Movements: ____________ Start time: ____________ Erica [name] time: ____________ Date: ____________ Movements: ____________ Start time: ____________ Erica [name] time: ____________ Date: ____________ Movements: ____________ Start time: ____________ Erica [name] time: ____________ Date: ____________ Movements: ____________ Start time: ____________ Erica [name] time: ____________ Document Released: 06/13/2006 Document Revised: 04/30/2012 Document Reviewed: 03/10/2012 [employer] Patient Information 2014 [employer].

## 2013-04-07 NOTE — Progress Notes (Signed)
No bleeding, good fetal movement, complains of white discharge and dry and back hurts at work when standing,on vaginal exam +white discharge without odor, cervix high and closed, wet prep +yeast will rx diflucan, increase fluids and sit some at work, will follow up in 4 weeks for OB visit

## 2013-04-08 LAB — HIV ANTIBODY (ROUTINE TESTING W REFLEX): HIV: NONREACTIVE

## 2013-04-08 LAB — POCT URINALYSIS DIPSTICK
Glucose, UA: NEGATIVE
Protein, UA: NEGATIVE

## 2013-04-08 LAB — RPR

## 2013-04-08 LAB — GLUCOSE TOLERANCE, 2 HOURS W/ 1HR
Glucose, 1 hour: 161 mg/dL (ref 70–170)
Glucose, Fasting: 82 mg/dL (ref 70–99)

## 2013-04-08 NOTE — Addendum Note (Signed)
Addended by: Richardson Chiquito on: 04/08/2013 10:58 AM   Modules accepted: Orders

## 2013-05-04 ENCOUNTER — Telehealth: Payer: Self-pay | Admitting: *Deleted

## 2013-05-04 DIAGNOSIS — B3731 Acute candidiasis of vulva and vagina: Secondary | ICD-10-CM

## 2013-05-04 DIAGNOSIS — O0932 Supervision of pregnancy with insufficient antenatal care, second trimester: Secondary | ICD-10-CM

## 2013-05-04 DIAGNOSIS — Z3403 Encounter for supervision of normal first pregnancy, third trimester: Secondary | ICD-10-CM

## 2013-05-04 DIAGNOSIS — B373 Candidiasis of vulva and vagina: Secondary | ICD-10-CM

## 2013-05-04 MED ORDER — FLUCONAZOLE 150 MG PO TABS: 150.0000 mg | ORAL_TABLET | Freq: Once | ORAL | Status: DC

## 2013-05-04 NOTE — Telephone Encounter (Signed)
Complains of yeast, wants refill on diflucan, will do

## 2013-05-04 NOTE — Telephone Encounter (Signed)
Pt states that she has a very bad yeast infection. Pt states that she can't come in because of a problem with her insurance. Can you please give her a refill on diflucan?

## 2013-05-05 ENCOUNTER — Encounter: Payer: Self-pay | Admitting: Women's Health

## 2013-06-09 ENCOUNTER — Ambulatory Visit (INDEPENDENT_AMBULATORY_CARE_PROVIDER_SITE_OTHER): Payer: Medicaid Other | Admitting: Advanced Practice Midwife

## 2013-06-09 VITALS — BP 120/78 | Wt 172.5 lb

## 2013-06-09 DIAGNOSIS — O99891 Other specified diseases and conditions complicating pregnancy: Secondary | ICD-10-CM

## 2013-06-09 DIAGNOSIS — Z331 Pregnant state, incidental: Secondary | ICD-10-CM

## 2013-06-09 DIAGNOSIS — O2390 Unspecified genitourinary tract infection in pregnancy, unspecified trimester: Secondary | ICD-10-CM

## 2013-06-09 DIAGNOSIS — O99019 Anemia complicating pregnancy, unspecified trimester: Secondary | ICD-10-CM

## 2013-06-09 DIAGNOSIS — Z1389 Encounter for screening for other disorder: Secondary | ICD-10-CM

## 2013-06-09 DIAGNOSIS — Z3400 Encounter for supervision of normal first pregnancy, unspecified trimester: Secondary | ICD-10-CM

## 2013-06-09 DIAGNOSIS — O9989 Other specified diseases and conditions complicating pregnancy, childbirth and the puerperium: Secondary | ICD-10-CM

## 2013-06-09 LAB — POCT URINALYSIS DIPSTICK
GLUCOSE UA: NEGATIVE
Ketones, UA: NEGATIVE
Leukocytes, UA: NEGATIVE
NITRITE UA: NEGATIVE
RBC UA: NEGATIVE

## 2013-06-09 MED ORDER — AZITHROMYCIN 250 MG PO TABS: ORAL_TABLET | ORAL | Status: DC

## 2013-06-09 MED ORDER — FLUCONAZOLE 150 MG PO TABS: ORAL_TABLET | ORAL | Status: DC

## 2013-06-09 NOTE — Progress Notes (Signed)
Has not beenseen in 2 months.  Medicaid issues per pt.    C/O thick white discharge.  SSE:  Discharge c/w yeast. Wet prep +; no clue or WBC  Rx diflucan, repeat in 3 days. Has had URI sx for 3-4 weeks.  Feels hot, has not taken temperature. zpack given.   [facility].  F/U 1 weeks LROB

## 2013-06-10 LAB — DRUG SCREEN, URINE, NO CONFIRMATION
Amphetamine Screen, Ur: NEGATIVE
BARBITURATE QUANT UR: NEGATIVE
Benzodiazepines.: NEGATIVE
CREATININE, U: 109.1 mg/dL
Cocaine Metabolites: NEGATIVE
MARIJUANA METABOLITE: NEGATIVE
METHADONE: NEGATIVE
OPIATE SCREEN, URINE: NEGATIVE
PROPOXYPHENE: NEGATIVE
Phencyclidine (PCP): NEGATIVE

## 2013-06-10 LAB — OXYCODONE SCREEN, UA, RFLX CONFIRM: Oxycodone Screen, Ur: NEGATIVE ng/mL

## 2013-06-10 LAB — GC/CHLAMYDIA PROBE AMP
CT Probe RNA: NEGATIVE
GC PROBE AMP APTIMA: NEGATIVE

## 2013-06-16 ENCOUNTER — Encounter: Payer: Medicaid Other | Admitting: Obstetrics & Gynecology

## 2013-06-17 ENCOUNTER — Encounter: Payer: Medicaid Other | Admitting: Women's Health

## 2013-06-19 ENCOUNTER — Ambulatory Visit (INDEPENDENT_AMBULATORY_CARE_PROVIDER_SITE_OTHER): Payer: Medicaid Other | Admitting: Obstetrics & Gynecology

## 2013-06-19 ENCOUNTER — Encounter: Payer: Self-pay | Admitting: Obstetrics & Gynecology

## 2013-06-19 VITALS — BP 120/80 | Wt 174.0 lb

## 2013-06-19 DIAGNOSIS — Z331 Pregnant state, incidental: Secondary | ICD-10-CM

## 2013-06-19 DIAGNOSIS — Z1389 Encounter for screening for other disorder: Secondary | ICD-10-CM

## 2013-06-19 DIAGNOSIS — O99019 Anemia complicating pregnancy, unspecified trimester: Secondary | ICD-10-CM

## 2013-06-19 DIAGNOSIS — O99891 Other specified diseases and conditions complicating pregnancy: Secondary | ICD-10-CM

## 2013-06-19 DIAGNOSIS — O9989 Other specified diseases and conditions complicating pregnancy, childbirth and the puerperium: Secondary | ICD-10-CM

## 2013-06-19 LAB — POCT URINALYSIS DIPSTICK
GLUCOSE UA: NEGATIVE
Leukocytes, UA: NEGATIVE
NITRITE UA: NEGATIVE
Protein, UA: NEGATIVE
RBC UA: NEGATIVE

## 2013-06-19 NOTE — Progress Notes (Signed)
BP weight and urine results all reviewed and noted. Patient reports good fetal movement, denies any bleeding and no rupture of membranes symptoms or regular contractions. Patient is without complaints. All questions were answered.  No need to do GBS, + in urine

## 2013-06-25 ENCOUNTER — Ambulatory Visit (INDEPENDENT_AMBULATORY_CARE_PROVIDER_SITE_OTHER): Payer: Medicaid Other | Admitting: Advanced Practice Midwife

## 2013-06-25 ENCOUNTER — Encounter (INDEPENDENT_AMBULATORY_CARE_PROVIDER_SITE_OTHER): Payer: Self-pay

## 2013-06-25 VITALS — BP 120/70 | Wt 182.0 lb

## 2013-06-25 DIAGNOSIS — O99891 Other specified diseases and conditions complicating pregnancy: Secondary | ICD-10-CM

## 2013-06-25 DIAGNOSIS — Z331 Pregnant state, incidental: Secondary | ICD-10-CM

## 2013-06-25 DIAGNOSIS — Z1389 Encounter for screening for other disorder: Secondary | ICD-10-CM

## 2013-06-25 DIAGNOSIS — O9989 Other specified diseases and conditions complicating pregnancy, childbirth and the puerperium: Secondary | ICD-10-CM

## 2013-06-25 DIAGNOSIS — O99019 Anemia complicating pregnancy, unspecified trimester: Secondary | ICD-10-CM

## 2013-06-25 LAB — POCT URINALYSIS DIPSTICK
Blood, UA: NEGATIVE
GLUCOSE UA: NEGATIVE
Ketones, UA: NEGATIVE
Leukocytes, UA: NEGATIVE
Nitrite, UA: NEGATIVE
Protein, UA: NEGATIVE

## 2013-06-25 NOTE — Progress Notes (Signed)
Started having irregular ctx about 12 hours ago, has had several 8 minutes apart.  Labor precautions discussed.  F/U 1 week if still pregnant.

## 2013-06-27 ENCOUNTER — Encounter (HOSPITAL_COMMUNITY): Payer: Self-pay | Admitting: *Deleted

## 2013-06-27 ENCOUNTER — Encounter: Payer: Self-pay | Admitting: Women's Health

## 2013-06-27 ENCOUNTER — Emergency Department (HOSPITAL_COMMUNITY)
Admission: EM | Admit: 2013-06-27 | Discharge: 2013-06-27 | Disposition: A | Discharge status: Still In Hospital | Payer: Medicaid Other | Source: Home / Self Care | Attending: Emergency Medicine | Admitting: Emergency Medicine

## 2013-06-27 ENCOUNTER — Inpatient Hospital Stay (HOSPITAL_COMMUNITY)
Admission: AD | Admit: 2013-06-27 | Discharge: 2013-06-28 | DRG: 775 | Disposition: A | Discharge status: Home / Self Care | Payer: Medicaid Other | Source: Ambulatory Visit | Attending: Obstetrics and Gynecology | Admitting: Obstetrics and Gynecology

## 2013-06-27 ENCOUNTER — Encounter (HOSPITAL_COMMUNITY): Payer: Self-pay | Admitting: Emergency Medicine

## 2013-06-27 DIAGNOSIS — O99892 Other specified diseases and conditions complicating childbirth: Secondary | ICD-10-CM | POA: Diagnosis present

## 2013-06-27 DIAGNOSIS — O9989 Other specified diseases and conditions complicating pregnancy, childbirth and the puerperium: Secondary | ICD-10-CM

## 2013-06-27 DIAGNOSIS — O9902 Anemia complicating childbirth: Secondary | ICD-10-CM

## 2013-06-27 DIAGNOSIS — Z22330 Carrier of Group B streptococcus: Secondary | ICD-10-CM

## 2013-06-27 DIAGNOSIS — D573 Sickle-cell trait: Secondary | ICD-10-CM | POA: Diagnosis present

## 2013-06-27 DIAGNOSIS — IMO0001 Reserved for inherently not codable concepts without codable children: Secondary | ICD-10-CM

## 2013-06-27 DIAGNOSIS — O0930 Supervision of pregnancy with insufficient antenatal care, unspecified trimester: Secondary | ICD-10-CM

## 2013-06-27 LAB — CBC
HEMATOCRIT: 30.1 % — AB (ref 36.0–46.0)
Hemoglobin: 10 g/dL — ABNORMAL LOW (ref 12.0–15.0)
MCH: 23.7 pg — ABNORMAL LOW (ref 26.0–34.0)
MCHC: 33.2 g/dL (ref 30.0–36.0)
MCV: 71.3 fL — ABNORMAL LOW (ref 78.0–100.0)
PLATELETS: 226 10*3/uL (ref 150–400)
RBC: 4.22 MIL/uL (ref 3.87–5.11)
RDW: 16 % — ABNORMAL HIGH (ref 11.5–15.5)
WBC: 17.1 10*3/uL — AB (ref 4.0–10.5)

## 2013-06-27 LAB — ABO/RH: ABO/RH(D): O POS

## 2013-06-27 LAB — RPR: RPR Ser Ql: NONREACTIVE

## 2013-06-27 MED ORDER — DIBUCAINE 1 % RE OINT
1.0000 | TOPICAL_OINTMENT | RECTAL | Status: DC | PRN
Start: 2013-06-27 — End: 2013-06-28

## 2013-06-27 MED ORDER — SODIUM CHLORIDE 0.9 % IV SOLN: 250.0000 mL | INTRAVENOUS | Status: DC | PRN

## 2013-06-27 MED ORDER — SODIUM CHLORIDE 0.9 % IV BOLUS (SEPSIS)
1000.0000 mL | Freq: Once | INTRAVENOUS | Status: DC
Start: 2013-06-27 — End: 2013-06-27

## 2013-06-27 MED ORDER — SODIUM CHLORIDE 0.9 % IJ SOLN: 3.0000 mL | Freq: Two times a day (BID) | INTRAMUSCULAR | Status: DC

## 2013-06-27 MED ORDER — LANOLIN HYDROUS EX OINT: TOPICAL_OINTMENT | CUTANEOUS | Status: DC | PRN

## 2013-06-27 MED ORDER — OXYTOCIN 40 UNITS IN LACTATED RINGERS INFUSION - SIMPLE MED
62.5000 mL/h | INTRAVENOUS | Status: DC | PRN
Start: 2013-06-27 — End: 2013-06-28

## 2013-06-27 MED ORDER — SENNOSIDES-DOCUSATE SODIUM 8.6-50 MG PO TABS
2.0000 | ORAL_TABLET | ORAL | Status: DC
  Administered 2013-06-27: 2 via ORAL
  Filled 2013-06-27: qty 2

## 2013-06-27 MED ORDER — DIPHENHYDRAMINE HCL 25 MG PO CAPS: 25.0000 mg | ORAL_CAPSULE | Freq: Four times a day (QID) | ORAL | Status: DC | PRN

## 2013-06-27 MED ORDER — MEASLES, MUMPS & RUBELLA VAC ~~LOC~~ INJ
0.5000 mL | INJECTION | Freq: Once | SUBCUTANEOUS | Status: DC
  Filled 2013-06-27: qty 0.5

## 2013-06-27 MED ORDER — IBUPROFEN 600 MG PO TABS
600.0000 mg | ORAL_TABLET | Freq: Four times a day (QID) | ORAL | Status: DC
  Administered 2013-06-27 – 2013-06-28 (×4): 600 mg via ORAL
  Filled 2013-06-27 (×4): qty 1

## 2013-06-27 MED ORDER — TETANUS-DIPHTH-ACELL PERTUSSIS 5-2.5-18.5 LF-MCG/0.5 IM SUSP: 0.5000 mL | Freq: Once | INTRAMUSCULAR | Status: DC

## 2013-06-27 MED ORDER — SIMETHICONE 80 MG PO CHEW: 80.0000 mg | CHEWABLE_TABLET | ORAL | Status: DC | PRN

## 2013-06-27 MED ORDER — ZOLPIDEM TARTRATE 5 MG PO TABS: 5.0000 mg | ORAL_TABLET | Freq: Every evening | ORAL | Status: DC | PRN

## 2013-06-27 MED ORDER — ONDANSETRON HCL 4 MG PO TABS: 4.0000 mg | ORAL_TABLET | ORAL | Status: DC | PRN

## 2013-06-27 MED ORDER — OXYCODONE-ACETAMINOPHEN 5-325 MG PO TABS: 1.0000 | ORAL_TABLET | ORAL | Status: DC | PRN

## 2013-06-27 MED ORDER — BISACODYL 10 MG RE SUPP: 10.0000 mg | Freq: Every day | RECTAL | Status: DC | PRN

## 2013-06-27 MED ORDER — SODIUM CHLORIDE 0.9 % IJ SOLN: 3.0000 mL | INTRAMUSCULAR | Status: DC | PRN

## 2013-06-27 MED ORDER — PRENATAL MULTIVITAMIN CH
1.0000 | ORAL_TABLET | Freq: Every day | ORAL | Status: DC
  Administered 2013-06-28: 1 via ORAL
  Filled 2013-06-27: qty 1

## 2013-06-27 MED ORDER — ONDANSETRON HCL 4 MG/2ML IJ SOLN: 4.0000 mg | INTRAMUSCULAR | Status: DC | PRN

## 2013-06-27 MED ORDER — BENZOCAINE-MENTHOL 20-0.5 % EX AERO
1.0000 "application " | INHALATION_SPRAY | CUTANEOUS | Status: DC | PRN
  Administered 2013-06-27: 1 via TOPICAL
  Filled 2013-06-27: qty 56

## 2013-06-27 MED ORDER — WITCH HAZEL-GLYCERIN EX PADS: 1.0000 "application " | MEDICATED_PAD | CUTANEOUS | Status: DC | PRN

## 2013-06-27 MED ORDER — FLEET ENEMA 7-19 GM/118ML RE ENEM: 1.0000 | ENEMA | Freq: Every day | RECTAL | Status: DC | PRN

## 2013-06-27 NOTE — ED Notes (Signed)
Pt left prior to starting NS bolus.

## 2013-06-27 NOTE — ED Notes (Signed)
Dr. Jeraldine LootsLockwood at bedside upon pt arrival to er,

## 2013-06-27 NOTE — ED Notes (Signed)
[facility] alerted by charge nurse that pt is on monitor. EDP at bedside assessing pt's condition.

## 2013-06-27 NOTE — MAU Note (Signed)
Patient arrived via EMS from [facility]. Artelia LarocheM. Williams, CNM at bedside. Patient 10 cm.  Theone MurdochM. Early, OBRR at bedside within minutes.

## 2013-06-27 NOTE — ED Provider Notes (Signed)
CSN: [id number]     Arrival date & time 06/27/13  [id number] History   This chart was scribed for Erica Munchobert Woods Gangemi, MD by Blanchard KelchNicole Curnes, ED Scribe. The patient was seen in room APA01/APA01. Patient's care was started at 8:37 AM.    Chief Complaint  Patient presents with  . Contractions    The history is provided by the patient. No language interpreter was used.    HPI Comments: Erica [name] is a 18 y.o. female that is nine months pregnant who presents to the Emergency Department due to intermittent episodes of abdominal pain she attributes to contractions that began six and a half hours ago (2:00 AM). She is unable to state how often the contractions are coming. She reports one episode of emesis with the contractions. She denies noticing any leakage of fluids but also states she may have urinated on herself. This is her first pregnancy with expected due date of 07/04/13. She [facility]. She states that she tried to call the after hours number when the contractions began but there was no answer.     Past Medical History  Diagnosis Date  . Ganglion cyst of wrist     left  . Yeast infection 04/07/2013   Past Surgical History  Procedure Laterality Date  . No past surgeries     Family History  Problem Relation Age of Onset  . Diabetes Maternal Grandmother   . Diabetes Paternal Grandmother   . Multiple sclerosis Cousin    History  Substance Use Topics  . Smoking status: Never Smoker   . Smokeless tobacco: Never Used  . Alcohol Use: No   OB History   Grav Para Term Preterm Abortions TAB SAB Ect Mult Living   1              Review of Systems  Constitutional:       Per HPI, otherwise negative  HENT:       Per HPI, otherwise negative  Respiratory:       Per HPI, otherwise negative  Cardiovascular:       Per HPI, otherwise negative  Gastrointestinal: Positive for vomiting and abdominal pain.  Endocrine:       Negative aside from HPI   Genitourinary:       Neg aside from HPI   Musculoskeletal:       Per HPI, otherwise negative  Skin: Negative.   Neurological: Negative for syncope.    Allergies  Review of patient's allergies indicates no known allergies.  Home Medications   Current Outpatient Rx  Name  Route  Sig  Dispense  Refill  . Prenatal Vit-Fe Fumarate-FA (PRENATAL VITAMINS PLUS) 27-1 MG TABS   Oral   Take 1 tablet by mouth daily.   30 tablet   11    Triage Vitals: BP 149/89  Pulse 78  Temp(Src) 98.1 F (36.7 C) (Oral)  Resp 36  SpO2 100%  LMP 10/08/2012  Physical Exam  Nursing note and vitals reviewed. Constitutional: She is oriented to person, place, and time. She appears well-developed and well-nourished. She appears distressed.  HENT:  Head: Normocephalic and atraumatic.  Eyes: Conjunctivae and EOM are normal.  Cardiovascular: Normal rate and regular rhythm.   Pulmonary/Chest: Effort normal and breath sounds normal. No stridor. No respiratory distress.  Abdominal: She exhibits no distension.  Genitourinary:  Cervix dilated to unknown length. Extremity appreciated upon internal exam. Chaperone present.  No appreciable bleeding or clear fluid.  Musculoskeletal: She exhibits no edema.  Neurological: She is alert and oriented to person, place, and time. No cranial nerve deficit.  Skin: Skin is warm and dry.  Psychiatric: She has a normal mood and affect.    ED Course  Pelvic exam Date/Time: 06/27/2013 8:45 AM Performed by: Erica Munch Authorized by: Erica Munch Consent: Verbal consent obtained. The procedure was performed in an emergent situation. Risks and benefits: risks, benefits and alternatives were discussed Consent given by: patient Required items: required blood products, implants, devices, and special equipment available Patient identity confirmed: verbally with patient Time out: Immediately prior to procedure a "time out" was called to verify the correct patient,  procedure, equipment, support staff and site/side marked as required. Preparation: Patient was prepped and draped in the usual sterile fashion. Local anesthesia used: no Patient sedated: no Patient tolerance: Patient tolerated the procedure well with no immediate complications.   (including critical care time)   COORDINATION OF CARE: 8:46 AM -Will transfer patient to [facility]. [facility] accepted patient. Contractions occurring every five to seven minutes with fetal heart rate in the 140s. Patient verbalizes understanding and agrees with treatment plan.    8:58 AM -EMS arrived and patient is ready for transfer. Patient verbalizes understanding and agrees with treatment plan.  BS [place] note: Indication: active labor w breech presentation. Consent: verbal Findings: IUP w FHT ~140-150 Procedure well tolerated.    Labs Review Labs Reviewed - No data to display Imaging Review No results found.   MDM   1. Active labor at term     I personally performed the services described in this documentation, which was scribed in my presence. The recorded information has been reviewed and is accurate.    This patient presents with contractions during her first pregnancy.  Notably, the patient's full-term.  Patient herself is uncomfortable appearing, though hemodynamically stable. Immediately after the patient's arrival she was placed on obstetrical monitoring devices.  Fetal heart tone was 140, with contractions occurring every 5-7 minutes with appropriate deceleration, with recovery to fetal heart tones 140/150. Immediately after the arrival we discussed the patient's case with our obstetrics team, including nursing, physician.  On exam the patient had a palpable extremity appreciated in the cervical opening. Bedside ultrasound was treated appropriate fetal heart tones, and with concern for breech presentation in his full-term female expeditious transfer to our obstetrics team was indicated. At  this facility, there is no obstetrics team in-house, no surgical team here. The patient is in active labor, the benefits of transfer outweigh risks. Patient and her partner both provide consent for transfer, understanding of risks.  Erica Munch, MD 06/27/13 1537

## 2013-06-27 NOTE — Progress Notes (Signed)
Delivery Note Arrived Via EMS with urge to push. Reported to be full term and breech per ER physician.  Upon exam, cervix noted to be 10/c/0/vertex presentation. At delivery, a nuchal hand was noted, however.  At  a viable and healthy unspecified sex was delivered via  (Presentation:OA  ).  APGAR:9/9 , ; weight .   Placenta status: delivered spontaneously and grossly intact with 3VC.  Cord:  with the following complications: none except nuchal hand.  No difficulty with shoulders.   Anesthesia:  none Episiotomy: none Lacerations: bilateral anterior labial lacerations, not bleeding, not repaired. Suture Repair: n/a Est. Blood Loss (mL): 200ml  Mom to postpartum.  Baby to Couplet care / Skin to Skin  Joellyn HaffKim Booker CNM notified. She will do H&P.  [facility] CenterWILLIAMS,Erica Lucy 06/27/2013, 9:47 AM

## 2013-06-27 NOTE — ED Notes (Signed)
Pt left the facility emergency traffic by EMS. [facility] by physician and charge nurse about pt's condition and status. FHR 140's upon departure.

## 2013-06-27 NOTE — H&P (Signed)
Attestation of Attending Supervision of Advanced Practitioner (CNM/NP): Evaluation and management procedures were performed by the Advanced Practitioner under my supervision and collaboration.  I have reviewed the Advanced Practitioner's note and chart, and I agree with the management and plan.  Terryl Molinelli 06/27/2013 6:59 PM

## 2013-06-27 NOTE — ED Notes (Signed)
Pt c/o "abd pain, contractions" that started around 2am, pt states that she may have "peed" on her self, she is not sure, does admit to throwing up X1 this am, pt reports that this is first pregnancy, due date 07/04/2013,

## 2013-06-27 NOTE — Progress Notes (Signed)
Spoke with [facility] RN and asked her to adjust the fhr monitor. The fhr looks like it is tracing in the 80's to 90's. She says that the fm is being dc'd. The EMS is there to transfer the pt I  Asked her to get a fhr to make sure that it is not in the 80's or 90's. She says that it is 140bpm. [facility]

## 2013-06-27 NOTE — Progress Notes (Signed)
Received call from [facility] RN saying that pt presents with c/o uc's since 2:00 this morning. Pt can be heard screaming in the back ground. I asked them to have someone to check the pt's cervix. The MD states that he does not know how far dilated the pt is and she is breech. When asked if the pt is ruptured, the ED RN says that the pt has been feeling wet. I told the ED RN to [facility] the [facility], Dr. Ladean Rayaonstance to determine if the pt is going to be a safe transfer.

## 2013-06-27 NOTE — H&P (Signed)
Erica PersonDanielle [name] is a 18 y.o. G1P0, now 491P1001, female at 2660w0d by 15.6wk u/s, who presented to [facility] this am w/ report of uc's that began at 0200. She was evaluated there by EDP and thought to be 2cm w/ ? fetal presentation, and after EDP consulted w/ Dr. Jolayne Pantheronstant, [facility], where she had a NSVD of viable female infant, vtx presentation w/ compound hand by Wynelle BourgeoisMarie Williams, CNM, 7mins after she arrived.  Pt reports SROM clear fluid [facility].   [place] care in [place] at 8 wks, had 15.6wk dating u/s at [facility], and [facility] 23.3[facility]. Had another lapse in care from 27.3wks-36.3wks, she reported was d/t problems w/ Medicaid. Pregnancy complicated by gbs uti early pregnancy, +sickle cell trait w/ FOB carrier status unknown, and insufficient pnc.   Prenatal History/Complications:  Past Medical History: Past Medical History  Diagnosis Date  . Ganglion cyst of wrist     left  . Yeast infection 04/07/2013    Past Surgical History: Past Surgical History  Procedure Laterality Date  . No past surgeries      Obstetrical History: OB History   Grav Para Term Preterm Abortions TAB SAB Ect Mult Living   1               Social History: History   Social History  . Marital Status: Single    Spouse Name: N/A    Number of Children: N/A  . Years of Education: N/A   Social History Main Topics  . Smoking status: Never Smoker   . Smokeless tobacco: Never Used  . Alcohol Use: No  . Drug Use: No  . Sexual Activity: Yes    Birth Control/ Protection: None   Other Topics Concern  . Not on file   Social History Narrative  . No narrative on file    Family History: Family History  Problem Relation Age of Onset  . Diabetes Maternal Grandmother   . Diabetes Paternal Grandmother   . Multiple sclerosis Cousin     Allergies: No Known Allergies  Prescriptions prior to admission  Medication Sig Dispense Refill  . Prenatal  Vit-Fe Fumarate-FA (PRENATAL VITAMINS PLUS) 27-1 MG TABS Take 1 tablet by mouth daily.  30 tablet  11     Review of Systems  Pertinent pos/neg as indicated in HPI  Last menstrual period 10/08/2012. General appearance: alert and mild distress, anxious from rapid unmedicated delivey Lungs: clear to auscultation bilaterally Heart: regular rate and rhythm Abdomen: gravid, soft, non-tender Extremities: No edema DTR's 2+  Prenatal labs: ABO, Rh: O/Positive/-- (07/15 0000) Antibody: NEG (11/11 1000) Rubella: Immune RPR: NON REAC (11/11 1000)  HBsAg: Negative (07/15 0000)  HIV: NON REACTIVE (11/11 1000)  GBS: Positive (07/15 0000)   2 hr GTT: normal 82/161/99 Genetic screening:  Too late Anatomy US: normal female  Assessment:  Primigravida w/ NSVD of viable female in [facility] at 7660w0d after being transferred from [facility] in labor, hemostatic bilateral labial lacs not requiring repair  GBS Positive (07/15 0000) and not treated d/t rapid labor  Insufficient pnc  +Sickle cell trait, FOB status unknown  Plan:  Admit to [facility]  Routine pp orders  Plans to breastfeed  Undecided about contraception  Nursery notified of +GBS, untreated  Marge DuncansBooker, Erica [name] CNM, WHNP-BC 06/27/2013, 9:58 AM

## 2013-06-27 NOTE — Progress Notes (Signed)
Spoke with Dr. Jolayne Pantheronstant. She says that she did speak with the [facility] MD. She asked him how many fingers he could get into the pt's cervix, he replied 2. She feels that the pt is Erica [name] and can be transferred here.

## 2013-06-27 NOTE — Progress Notes (Signed)
Called [facility] RN to ask if the ER MD has [facility]. She says that he has and that [facility].

## 2013-06-28 ENCOUNTER — Ambulatory Visit: Payer: Self-pay

## 2013-06-28 ENCOUNTER — Encounter (HOSPITAL_COMMUNITY): Payer: Self-pay | Admitting: *Deleted

## 2013-06-28 MED ORDER — INFLUENZA VAC SPLIT QUAD 0.5 ML IM SUSP
0.5000 mL | INTRAMUSCULAR | Status: AC
  Administered 2013-06-28: 0.5 mL via INTRAMUSCULAR

## 2013-06-28 MED ORDER — IBUPROFEN 600 MG PO TABS: 600.0000 mg | ORAL_TABLET | Freq: Four times a day (QID) | ORAL | Status: DC | PRN

## 2013-06-28 NOTE — Discharge Summary (Signed)
Attestation of Attending Supervision of Advanced Practitioner (CNM/NP): Evaluation and management procedures were performed by the Advanced Practitioner under my supervision and collaboration.  I have reviewed the Advanced Practitioner's note and chart, and I agree with the management and plan.  Analina Filla 06/28/2013 8:49 AM

## 2013-06-28 NOTE — Discharge Instructions (Signed)
Postpartum Care After Vaginal Delivery °After you deliver your newborn (postpartum period), the usual stay in the hospital is 24 72 hours. If there were problems with your labor or delivery, or if you have other medical problems, you might be in the hospital longer.  °While you are in the hospital, you will receive help and instructions on how to care for yourself and your newborn during the postpartum period.  °While you are in the hospital: °· Be sure to tell your nurses if you have pain or discomfort, as well as where you feel the pain and what makes the pain worse. °· If you had an incision made near your vagina (episiotomy) or if you had some tearing during delivery, the nurses may put ice packs on your episiotomy or tear. The ice packs may help to reduce the pain and swelling. °· If you are breastfeeding, you may feel uncomfortable contractions of your uterus for a couple of weeks. This is normal. The contractions help your uterus get back to normal size. °· It is normal to have some bleeding after delivery. °· For the first 1 3 days after delivery, the flow is red and the amount may be similar to a period. °· It is common for the flow to start and stop. °· In the first few days, you may pass some small clots. Let your nurses know if you begin to pass large clots or your flow increases. °· Do not  flush blood clots down the toilet before having the nurse look at them. °· During the next 3 10 days after delivery, your flow should become more watery and pink or brown-tinged in color. °· Ten to fourteen days after delivery, your flow should be a small amount of yellowish-white discharge. °· The amount of your flow will decrease over the first few weeks after delivery. Your flow may stop in 6 8 weeks. Most women have had their flow stop by 12 weeks after delivery. °· You should change your sanitary pads frequently. °· Wash your hands thoroughly with soap and water for at least 20 seconds after changing pads, using  the toilet, or before holding or feeding your newborn. °· You should feel like you need to empty your bladder within the first 6 8 hours after delivery. °· In case you become weak, lightheaded, or faint, call your nurse before you get out of bed for the first time and before you take a shower for the first time. °· Within the first few days after delivery, your breasts may begin to feel tender and full. This is called engorgement. Breast tenderness usually goes away within 48 72 hours after engorgement occurs. You may also notice milk leaking from your breasts. If you are not breastfeeding, do not stimulate your breasts. Breast stimulation can make your breasts produce more milk. °· Spending as much time as possible with your newborn is very important. During this time, you and your newborn can feel close and get to know each other. Having your newborn stay in your room (rooming in) will help to strengthen the bond with your newborn.  It will give you time to get to know your newborn and become comfortable caring for your newborn. °· Your hormones change after delivery. Sometimes the hormone changes can temporarily cause you to feel sad or tearful. These feelings should not last more than a few days. If these feelings last longer than that, you should talk to your caregiver. °· If desired, talk to your caregiver about   methods of family planning or contraception. °· Talk to your caregiver about immunizations. Your caregiver may want you to have the following immunizations before leaving the hospital: °· Tetanus, diphtheria, and pertussis (Tdap) or tetanus and diphtheria (Td) immunization. It is very important that you and your family (including grandparents) or others caring for your newborn are up-to-date with the Tdap or Td immunizations. The Tdap or Td immunization can help protect your newborn from getting ill. °· Rubella immunization. °· Varicella (chickenpox) immunization. °· Influenza immunization. You should  receive this annual immunization if you did not receive the immunization during your pregnancy. °Document Released: 03/11/2007 Document Revised: 02/06/2012 Document Reviewed: 01/09/2012 °ExitCare® Patient Information ©2014 [employer]. ° °Breastfeeding °Deciding to breastfeed is one of the best choices you can make for you and your baby. A change in hormones during pregnancy causes your breast tissue to grow and increases the number and size of your milk ducts. These hormones also allow proteins, sugars, and fats from your blood supply to make breast milk in your milk-producing glands. Hormones prevent breast milk from being released before your baby is born as well as prompt milk flow after birth. Once breastfeeding has begun, thoughts of your baby, as well as his or her sucking or crying, can stimulate the release of milk from your milk-producing glands.  °BENEFITS OF BREASTFEEDING °For Your Baby °· Your first milk (colostrum) helps your baby's digestive system function better.   °· There are antibodies in your milk that help your baby fight off infections.   °· Your baby has a lower incidence of asthma, allergies, and sudden infant death syndrome.   °· The nutrients in breast milk are better for your baby than infant formulas and are designed uniquely for your baby's needs.   °· Breast milk improves your baby's brain development.   °· Your baby is less likely to develop other conditions, such as childhood obesity, asthma, or type 2 diabetes mellitus.   °For You  °· Breastfeeding helps to create a very special bond between you and your baby.   °· Breastfeeding is convenient. Breast milk is always available at the correct temperature and costs nothing.   °· Breastfeeding helps to burn calories and helps you lose the weight gained during pregnancy.   °· Breastfeeding makes your uterus contract to its prepregnancy size faster and slows bleeding (lochia) after you give birth.   °· Breastfeeding helps to lower your  risk of developing type 2 diabetes mellitus, osteoporosis, and breast or ovarian cancer later in life. °SIGNS THAT YOUR BABY IS HUNGRY °Early Signs of Hunger  °· Increased alertness or activity. °· Stretching. °· Movement of the head from side to side. °· Movement of the head and opening of the mouth when the corner of the mouth or cheek is stroked (rooting). °· Increased sucking sounds, smacking lips, cooing, sighing, or squeaking. °· Hand-to-mouth movements. °· Increased sucking of fingers or hands. °Late Signs of Hunger °· Fussing. °· Intermittent crying. °Extreme Signs of Hunger °Signs of extreme hunger will require calming and consoling before your baby will be able to breastfeed successfully. Do not wait for the following signs of extreme hunger to occur before you initiate breastfeeding:   °· Restlessness. °· A loud, strong cry. °·  Screaming. °BREASTFEEDING BASICS °Breastfeeding Initiation °· Find a comfortable place to sit or lie down, with your neck and back well supported. °· Place a pillow or rolled up blanket under your baby to bring him or her to the level of your breast (if you are seated). Nursing pillows are   specially designed to help support your arms and your baby while you breastfeed. °· Make sure that your baby's abdomen is facing your abdomen.   °· Gently massage your breast. With your fingertips, massage from your chest wall toward your nipple in a circular motion. This encourages milk flow. You may need to continue this action during the feeding if your milk flows slowly. °· Support your breast with 4 fingers underneath and your thumb above your nipple. Make sure your fingers are well away from your nipple and your baby's mouth.   °· Stroke your baby's lips gently with your finger or nipple.   °· When your baby's mouth is open wide enough, quickly bring your baby to your breast, placing your entire nipple and as much of the colored area around your nipple (areola) as possible into your baby's  mouth.   °· More areola should be visible above your baby's upper lip than below the lower lip.   °· Your baby's tongue should be between his or her lower gum and your breast.   °· Ensure that your baby's mouth is correctly positioned around your nipple (latched). Your baby's lips should create a seal on your breast and be turned out (everted). °· It is common for your baby to suck about 2 3 minutes in order to start the flow of breast milk. °Latching °Teaching your baby how to latch on to your breast properly is very important. An improper latch can cause nipple pain and decreased milk supply for you and poor weight gain in your baby. Also, if your baby is not latched onto your nipple properly, he or she may swallow some air during feeding. This can make your baby fussy. Burping your baby when you switch breasts during the feeding can help to get rid of the air. However, teaching your baby to latch on properly is still the best way to prevent fussiness from swallowing air while breastfeeding. °Signs that your baby has successfully latched on to your nipple:    °· Silent tugging or silent sucking, without causing you pain.   °· Swallowing heard between every 3 4 sucks.   °·  Muscle movement above and in front of his or her ears while sucking.   °Signs that your baby has not successfully latched on to nipple:  °· Sucking sounds or smacking sounds from your baby while breastfeeding. °· Nipple pain. °If you think your baby has not latched on correctly, slip your finger into the corner of your baby's mouth to break the suction and place it between your baby's gums. Attempt breastfeeding initiation again. °Signs of Successful Breastfeeding °Signs from your baby:   °· A gradual decrease in the number of sucks or complete cessation of sucking.   °· Falling asleep.   °· Relaxation of his or her body.   °· Retention of a small amount of milk in his or her mouth.   °· Letting go of your breast by himself or herself. °Signs  from you: °· Breasts that have increased in firmness, weight, and size 1 3 hours after feeding.   °· Breasts that are softer immediately after breastfeeding. °· Increased milk volume, as well as a change in milk consistency and color by the 5th day of breastfeeding.   °· Nipples that are not sore, cracked, or bleeding. °Signs That Your Baby is Getting Enough Milk °· Wetting at least 3 diapers in a 24-hour period. The urine should be clear and pale yellow by age 5 days. °· At least 3 stools in a 24-hour period by age 5 days. The stool   should be soft and yellow. °· At least 3 stools in a 24-hour period by age 7 days. The stool should be seedy and yellow. °· No loss of weight greater than 10% of birth weight during the first 3 days of age. °· Average weight gain of 4 7 ounces (120 210 mL) per week after age 4 days. °· Consistent daily weight gain by age 5 days, without weight loss after the age of 2 weeks. °After a feeding, your baby may spit up a small amount. This is common. °BREASTFEEDING FREQUENCY AND DURATION °Frequent feeding will help you make more milk and can prevent sore nipples and breast engorgement. Breastfeed when you feel the need to reduce the fullness of your breasts or when your baby shows signs of hunger. This is called "breastfeeding on demand." Avoid introducing a pacifier to your baby while you are working to establish breastfeeding (the first 4 6 weeks after your baby is born). After this time you may choose to use a pacifier. Research has shown that pacifier use during the first year of a baby's life decreases the risk of sudden infant death syndrome (SIDS). °Allow your baby to feed on each breast as long as he or she wants. Breastfeed until your baby is finished feeding. When your baby unlatches or falls asleep while feeding from the first breast, offer the second breast. Because newborns are often sleepy in the first few weeks of life, you may need to awaken your baby to get him or her to  feed. °Breastfeeding times will vary from baby to baby. However, the following rules can serve as a guide to help you ensure that your baby is properly fed: °· Newborns (babies 4 weeks of age or younger) may breastfeed every 1 3 hours. °· Newborns should not go longer than 3 hours during the day or 5 hours during the night without breastfeeding. °· You should breastfeed your baby a minimum of 8 times in a 24-hour period until you begin to introduce solid foods to your baby at around 6 months of age. °BREAST MILK PUMPING °Pumping and storing breast milk allows you to ensure that your baby is exclusively fed your breast milk, even at times when you are unable to breastfeed. This is especially important if you are going back to work while you are still breastfeeding or when you are not able to be present during feedings. Your lactation consultant can give you guidelines on how long it is safe to store breast milk.  °A breast pump is a machine that allows you to pump milk from your breast into a sterile bottle. The pumped breast milk can then be stored in a refrigerator or freezer. Some breast pumps are operated by hand, while others use electricity. Ask your lactation consultant which type will work best for you. Breast pumps can be purchased, but some hospitals and breastfeeding support groups lease breast pumps on a monthly basis. A lactation consultant can teach you how to hand express breast milk, if you prefer not to use a pump.  °CARING FOR YOUR BREASTS WHILE YOU BREASTFEED °Nipples can become dry, cracked, and sore while breastfeeding. The following recommendations can help keep your breasts moisturized and healthy: °· Avoid using soap on your nipples.   °· Wear a supportive bra. Although not required, special nursing bras and tank tops are designed to allow access to your breasts for breastfeeding without taking off your entire bra or top. Avoid wearing underwire style bras or extremely tight bras. °·   Air dry  your nipples for 3 4 minutes after each feeding.   °· Use only cotton bra pads to absorb leaked breast milk. Leaking of breast milk between feedings is normal.   °· Use lanolin on your nipples after breastfeeding. Lanolin helps to maintain your skin's normal moisture barrier. If you use pure lanolin you do not need to wash it off before feeding your baby again. Pure lanolin is not toxic to your baby. You may also hand express a few drops of breast milk and gently massage that milk into your nipples and allow the milk to air dry. °In the first few weeks after giving birth, some women experience extremely full breasts (engorgement). Engorgement can make your breasts feel heavy, warm, and tender to the touch. Engorgement peaks within 3 5 days after you give birth. The following recommendations can help ease engorgement: °· Completely empty your breasts while breastfeeding or pumping. You may want to start by applying warm, moist heat (in the shower or with warm water-soaked hand towels) just before feeding or pumping. This increases circulation and helps the milk flow. If your baby does not completely empty your breasts while breastfeeding, pump any extra milk after he or she is finished. °· Wear a snug bra (nursing or regular) or tank top for 1 2 days to signal your body to slightly decrease milk production. °· Apply ice packs to your breasts, unless this is too uncomfortable for you. °· Make sure that your baby is latched on and positioned properly while breastfeeding. °If engorgement persists after 48 hours of following these recommendations, contact your health care provider or a lactation consultant. °OVERALL HEALTH CARE RECOMMENDATIONS WHILE BREASTFEEDING °· Eat healthy foods. Alternate between meals and snacks, eating 3 of each per day. Because what you eat affects your breast milk, some of the foods may make your baby more irritable than usual. Avoid eating these foods if you are sure that they are negatively  affecting your baby. °· Drink milk, fruit juice, and water to satisfy your thirst (about 10 glasses a day).   °· Rest often, relax, and continue to take your prenatal vitamins to prevent fatigue, stress, and anemia. °· Continue breast self-awareness checks. °· Avoid chewing and smoking tobacco. °· Avoid alcohol and drug use. °Some medicines that may be harmful to your baby can pass through breast milk. It is important to ask your health care provider before taking any medicine, including all over-the-counter and prescription medicine as well as vitamin and herbal supplements. °It is possible to become pregnant while breastfeeding. If birth control is desired, ask your health care provider about options that will be safe for your baby. °SEEK MEDICAL CARE IF:  °· You feel like you want to stop breastfeeding or have become frustrated with breastfeeding. °· You have painful breasts or nipples. °· Your nipples are cracked or bleeding. °· Your breasts are red, tender, or warm. °· You have a swollen area on either breast. °· You have a fever or chills. °· You have nausea or vomiting. °· You have drainage other than breast milk from your nipples. °· Your breasts do not become full before feedings by the 5th day after you give birth. °· You feel sad and depressed. °· Your baby is too sleepy to eat well. °· Your baby is having trouble sleeping.   °· Your baby is wetting less than 3 diapers in a 24-hour period. °· Your baby has less than 3 stools in a 24-hour period. °· Your baby's skin or the   white part of his or her eyes becomes yellow.   °· Your baby is not gaining weight by 5 days of age. °SEEK IMMEDIATE MEDICAL CARE IF:  °· Your baby is overly tired (lethargic) and does not want to wake up and feed. °· Your baby develops an unexplained fever. °Document Released: 05/14/2005 Document Revised: 01/14/2013 Document Reviewed: 11/05/2012 °ExitCare® Patient Information ©2014 [employer]. ° ° °

## 2013-06-28 NOTE — Discharge Summary (Signed)
Obstetric Discharge Summary Reason for Admission: onset of labor Prenatal Procedures: ultrasound Intrapartum Procedures: spontaneous vaginal delivery in MAU, GBS+ but no tx Postpartum Procedures: none Complications-Operative and Postpartum: bilateral labial 1st degree hemostatic lacs w/o repair Eating, drinking, voiding, ambulating well.  +flatus.  Lochia and pain wnl.  Denies dizziness, lightheadedness, or sob. No complaints.   Hemoglobin  Date Value Range Status  06/27/2013 10.0* 12.0 - 15.0 g/dL Final  4/69/62957/15/2014 [id number]   Final     HCT  Date Value Range Status  06/27/2013 30.1* 36.0 - 46.0 % Final  12/09/2012 35   Final   Hospital Course: [id number] by 15.6wk u/s, who presented to [facility] w/ report of uc's that began at 0200. She was evaluated there by EDP and thought to be 2cm w/ ? fetal presentation, and after EDP consulted w/ Dr. Jolayne Pantheronstant, [facility] a NSVD of viable female infant, vtx presentation w/ compound hand by Wynelle BourgeoisMarie Williams, CNM, 7mins after she arrived. Pt reports [facility] clear [facility]. Postpartum course has been uncomplicated. She was gbs pos and did not receive tx d/t rapid labor.  Physical Exam:  General: alert, cooperative and no distress Lochia: appropriate Uterine Fundus: firm Incision: n/a DVT Evaluation: No evidence of DVT seen on physical exam. Negative Homan's sign. No cords or calf tenderness. No significant calf/ankle edema.  Discharge Diagnoses: Term Pregnancy-delivered  Discharge Information: Date: 06/28/2013 Activity: pelvic rest Diet: routine Medications: PNV and Ibuprofen Condition: stable Instructions: refer to practice specific booklet Discharge to: home Follow-up Information   Schedule an appointment as soon as possible [facility] (in 4-6 weeks for your postpartum visit)    Specialty:  Obstetrics and Gynecology   Contact information:   [address]  [place] [id number] [phone]      Newborn Data: Live born female  Birth Weight: 6 lb 13.4 oz (3100 g) APGAR: 9, 9  Breastfeeding Undecided about contraception, states probably condoms, reviewed all of her options Pt to be d/c'd, will room w/ baby until baby able to be d/c'd 48hrs after birth   Marge DuncansBooker, Ilene Witcher Randall 06/28/2013, 6:41 AM

## 2013-06-28 NOTE — Lactation Note (Signed)
This note was copied from the chart of Erica Bernadene PersonDanielle Huron. Lactation Consultation Note  Patient Name: Erica Bernadene PersonDanielle Gagliardo WUJWJ'XToday's Date: 06/28/2013  Baby 35 hours old. Gave mom comfort gels with instruction. Enc mom to express colostrum and rub onto nipples and allow to air dry before placing comfort gels in bra.    Maternal Data    Feeding    [facility]                      Lactation Tools Discussed/Used     Consult Status      [name], Zaedyn Covin 06/28/2013, 9:16 PM

## 2013-06-29 ENCOUNTER — Ambulatory Visit: Payer: Self-pay

## 2013-06-29 NOTE — Lactation Note (Signed)
This note was copied from the chart of Erica Bernadene PersonDanielle Fenoglio. Lactation Consultation Note  Patient Name: Erica [name] NWGNF'AToday's Date: 06/29/2013 Reason for consult: Follow-up assessment;Breast/nipple pain Mom reports nipple tenderness on left breast. Baby suckling on pacifier when I arrived. Discussed with Mom how pacifiers can increase discomfort and interfere with BF. Assisted Mom with positioning and obtaining more depth with latching baby. Mom reports less discomfort. BF basics reviewed with Mom. Care for sore nipples reviewed. Mom has comfort gels. Engorgement care reviewed if needed. Mom has been supplementing. Encouraged to BF with each feeding to encourage milk production, prevent engorgement and protect milk supply. Mom has guidelines for supplementing handout. Advised of OP services and support group.   Maternal Data    Feeding Feeding Type: Breast Fed Length of feed: 15 min  LATCH Score/Interventions Latch: Grasps breast easily, tongue down, lips flanged, rhythmical sucking. Intervention(s): Adjust position;Assist with latch;Breast massage;Breast compression  Audible Swallowing: A few with stimulation  Type of Nipple: Everted at rest and after stimulation  Comfort (Breast/Nipple): Filling, red/small blisters or bruises, mild/mod discomfort  Problem noted: Mild/Moderate discomfort Interventions (Mild/moderate discomfort): Comfort gels (EBM to sore nipples)  Hold (Positioning): Assistance needed to correctly position infant at breast and maintain latch. Intervention(s): Breastfeeding basics reviewed;Support Pillows;Position options;Skin to skin  LATCH Score: 7  Lactation Tools Discussed/Used Tools: Pump;Comfort gels Breast pump type: Manual   Consult Status Consult Status: Complete Date: 06/29/13 Follow-up type: In-patient    Alfred LevinsGranger, Alan Drummer Ann 06/29/2013, 11:02 AM

## 2013-07-02 ENCOUNTER — Encounter: Payer: Medicaid Other | Admitting: Obstetrics & Gynecology

## 2013-07-21 ENCOUNTER — Ambulatory Visit: Payer: Medicaid Other | Admitting: Women's Health

## 2013-07-27 ENCOUNTER — Encounter: Payer: Self-pay | Admitting: Women's Health

## 2013-07-27 ENCOUNTER — Ambulatory Visit (INDEPENDENT_AMBULATORY_CARE_PROVIDER_SITE_OTHER): Payer: Medicaid Other | Admitting: Women's Health

## 2013-07-27 VITALS — BP 120/80 | Ht 63.0 in | Wt 154.0 lb

## 2013-07-27 DIAGNOSIS — Z3400 Encounter for supervision of normal first pregnancy, unspecified trimester: Secondary | ICD-10-CM

## 2013-07-27 DIAGNOSIS — D573 Sickle-cell trait: Secondary | ICD-10-CM

## 2013-07-27 DIAGNOSIS — Z3009 Encounter for other general counseling and advice on contraception: Secondary | ICD-10-CM

## 2013-07-27 DIAGNOSIS — Z3202 Encounter for pregnancy test, result negative: Secondary | ICD-10-CM

## 2013-07-27 LAB — POCT URINE PREGNANCY: PREG TEST UR: NEGATIVE

## 2013-07-27 NOTE — Progress Notes (Signed)
Patient ID: Erica [name], female   DOB: Mar 14, 1996, 18 y.o.   MRN: [id number] Subjective:    Erica [name] is a 18 y.o. 301P1001 African American female who presents for a postpartum visit. She is 4 weeks postpartum following a spontaneous vaginal delivery at 39.0 gestational weeks. Anesthesia: none. She presented to [facility] w/ report of uc's, she was evaluated there and thought to be 2cm w/ ? fetal presentation, [facility] where she had a NSVD of viable female infant, vtx presentation w/ compound hand 7mins after she arrived. She reports her water broke in ambulance. I have fully reviewed the prenatal and intrapartum course. Postpartum course has been uncomplicated. Baby's course has been uncomplicated. Baby is feeding by breast x 1 week, now bottle. Bleeding no bleeding. Bowel function is normal. Bladder function is normal. Patient is sexually active. Last sexual activity: 2/28. Contraception method is none and desires nexplanon. Has had unprotected sex multiple times since delivery. Postpartum depression screening: negative. Score 6.  Last pap n/a d/t age.  The following portions of the patient's history were reviewed and updated as appropriate: allergies, current medications, past medical history, past surgical history and problem list.  Review of Systems Pertinent items are noted in HPI.   Filed Vitals:   07/27/13 1225  BP: 120/80  Height: 5\' 3"  (1.6 m)  Weight: 154 lb (69.854 kg)    Objective:     General:  alert, cooperative and no distress   Breasts:  deferred, no complaints  Lungs: clear to auscultation bilaterally  Heart:  regular rate and rhythm  Abdomen: soft, nontender   Vulva: normal  Vagina: normal vagina  Cervix:  closed  Corpus: Well-involuted  Adnexa:  Non-palpable  Rectal Exam: No hemorrhoids        Results for orders placed in visit on 07/27/13 (from the past 24 hour(s))  POCT URINE PREGNANCY     Status: None   Collection Time   07/27/13 12:27 PM      Result Value Ref Range   Preg Test, Ur Negative      Assessment:   Postpartum exam 4 wks s/p SVD Depression screening Contraception counseling   Plan:   Contraception: none and and desires nexplanon.Understands NO SEX until after nexplanon placed.  Nexplanon pamphlet given and reviewed r/b Follow up in: 3/11 or after for bhcg and nexplanon pm, or earlier as needed.   Marge DuncansBooker, Jaterrius Ricketson Randall CNM, [facility]NP-BC 07/27/2013 1:00 PM

## 2013-07-27 NOTE — Patient Instructions (Signed)
NO SEX UNTIL AFTER NEXPLANON PLACED  Etonogestrel implant-Nexplanon What is this medicine? ETONOGESTREL (et oh noe JES trel) is a contraceptive (birth control) device. It is used to prevent pregnancy. It can be used for up to 3 years. This medicine may be used for other purposes; ask your health care provider or pharmacist if you have questions. COMMON BRAND NAME(S): Implanon, Nexplanon  What should I tell my health care provider before I take this medicine? They need to know if you have any of these conditions: -abnormal vaginal bleeding -blood vessel disease or blood clots -cancer of the breast, cervix, or liver -depression -diabetes -gallbladder disease -headaches -heart disease or recent heart attack -high blood pressure -high cholesterol -kidney disease -liver disease -renal disease -seizures -tobacco smoker -an unusual or allergic reaction to etonogestrel, other hormones, anesthetics or antiseptics, medicines, foods, dyes, or preservatives -pregnant or trying to get pregnant -breast-feeding How should I use this medicine? This device is inserted just under the skin on the inner side of your upper arm by a health care professional. Talk to your pediatrician regarding the use of this medicine in children. Special care may be needed. Overdosage: If you think you've taken too much of this medicine contact a poison control center or emergency room at once. Overdosage: If you think you have taken too much of this medicine contact a poison control center or emergency room at once. NOTE: This medicine is only for you. Do not share this medicine with others. What if I miss a dose? This does not apply. What may interact with this medicine? Do not take this medicine with any of the following medications: -amprenavir -bosentan -fosamprenavir This medicine may also interact with the following medications: -barbiturate medicines for inducing sleep or treating seizures -certain  medicines for fungal infections like ketoconazole and itraconazole -griseofulvin -medicines to treat seizures like carbamazepine, felbamate, oxcarbazepine, phenytoin, topiramate -modafinil -phenylbutazone -rifampin -some medicines to treat HIV infection like atazanavir, indinavir, lopinavir, nelfinavir, tipranavir, ritonavir -St. John's wort This list may not describe all possible interactions. Give your health care provider a list of all the medicines, herbs, non-prescription drugs, or dietary supplements you use. Also tell them if you smoke, drink alcohol, or use illegal drugs. Some items may interact with your medicine. What should I watch for while using this medicine? This product does not protect you against HIV infection (AIDS) or other sexually transmitted diseases. You should be able to feel the implant by pressing your fingertips over the skin where it was inserted. Tell your doctor if you cannot feel the implant. What side effects may I notice from receiving this medicine? Side effects that you should report to your doctor or health care professional as soon as possible: -allergic reactions like skin rash, itching or hives, swelling of the face, lips, or tongue -breast lumps -changes in vision -confusion, trouble speaking or understanding -dark urine -depressed mood -general ill feeling or flu-like symptoms -light-colored stools -loss of appetite, nausea -right upper belly pain -severe headaches -severe pain, swelling, or tenderness in the abdomen -shortness of breath, chest pain, swelling in a leg -signs of pregnancy -sudden numbness or weakness of the face, arm or leg -trouble walking, dizziness, loss of balance or coordination -unusual vaginal bleeding, discharge -unusually weak or tired -yellowing of the eyes or skin Side effects that usually do not require medical attention (Report these to your doctor or health care professional if they continue or are  bothersome.): -acne -breast pain -changes in weight -cough -fever or  chills -headache -irregular menstrual bleeding -itching, burning, and vaginal discharge -pain or difficulty passing urine -sore throat This list may not describe all possible side effects. Call your doctor for medical advice about side effects. You may report side effects to FDA at 1-800-FDA-1088. Where should I keep my medicine? This drug is given in a hospital or clinic and will not be stored at home. NOTE: This sheet is a summary. It may not cover all possible information. If you have questions about this medicine, talk to your doctor, pharmacist, or health care provider.  2014, Elsevier/Gold Standard. (2011-11-19 15:37:45)

## 2013-08-17 ENCOUNTER — Encounter: Payer: Self-pay | Admitting: *Deleted

## 2013-08-17 ENCOUNTER — Encounter: Payer: Medicaid Other | Admitting: Women's Health

## 2013-08-17 ENCOUNTER — Other Ambulatory Visit: Payer: Medicaid Other

## 2013-08-31 ENCOUNTER — Ambulatory Visit (INDEPENDENT_AMBULATORY_CARE_PROVIDER_SITE_OTHER): Payer: Medicaid Other | Admitting: Family Medicine

## 2013-08-31 VITALS — BP 147/78 | HR 90 | Temp 97.5°F | Ht 63.0 in | Wt 150.0 lb

## 2013-08-31 DIAGNOSIS — S42302A Unspecified fracture of shaft of humerus, left arm, initial encounter for closed fracture: Secondary | ICD-10-CM

## 2013-08-31 DIAGNOSIS — S42309A Unspecified fracture of shaft of humerus, unspecified arm, initial encounter for closed fracture: Secondary | ICD-10-CM

## 2013-08-31 NOTE — Progress Notes (Signed)
   Subjective:    Patient ID: Bernadene Personanielle Umble, female    DOB: 1995/11/02, 18 y.o.   MRN: [id number]  HPI This 18 y.o. female presents for evaluation of left displaced ulna fracture on 08/27/13 and she Had a splint placed and has not seen orthopedics.   Review of Systems No chest pain, SOB, HA, dizziness, vision change, N/V, diarrhea, constipation, dysuria, urinary urgency or frequency, myalgias, arthralgias or rash.     Objective:   Physical Exam  Vital signs noted  Well developed well nourished female.  HEENT - Head atraumatic Normocephalic                Eyes - PERRLA, Conjuctiva - clear Sclera- Clear EOMI                Ears - EAC's Wnl TM's Wnl Gross Hearing WNL Respiratory - Lungs CTA bilateral Cardiac - RRR S1 and S2 without murmur GI - Abdomen soft Nontender and bowel sounds active x 4 Extremities - No edema. Neuro - Grossly intact. MS - Left arm with cast     Assessment & Plan:  Arm fracture, left - Plan: Ambulatory referral to Orthopedic Surgery  Deatra CanterWilliam J Oxford FNP

## 2013-09-02 ENCOUNTER — Telehealth: Payer: Self-pay | Admitting: Family Medicine

## 2013-10-09 ENCOUNTER — Ambulatory Visit: Payer: Medicaid Other | Admitting: Family Medicine

## 2013-10-09 ENCOUNTER — Telehealth: Payer: Self-pay | Admitting: Nurse Practitioner

## 2013-10-09 NOTE — Telephone Encounter (Signed)
appt made

## 2013-10-14 ENCOUNTER — Encounter (HOSPITAL_COMMUNITY): Payer: Self-pay | Admitting: Emergency Medicine

## 2013-10-14 ENCOUNTER — Emergency Department (HOSPITAL_COMMUNITY)
Admission: EM | Admit: 2013-10-14 | Discharge: 2013-10-14 | Disposition: A | Discharge status: Home / Self Care | Payer: Medicaid Other | Attending: Emergency Medicine | Admitting: Emergency Medicine

## 2013-10-14 ENCOUNTER — Emergency Department (HOSPITAL_COMMUNITY): Payer: Medicaid Other

## 2013-10-14 ENCOUNTER — Ambulatory Visit: Payer: Medicaid Other | Admitting: Physician Assistant

## 2013-10-14 DIAGNOSIS — Z3202 Encounter for pregnancy test, result negative: Secondary | ICD-10-CM | POA: Insufficient documentation

## 2013-10-14 DIAGNOSIS — Z8739 Personal history of other diseases of the musculoskeletal system and connective tissue: Secondary | ICD-10-CM | POA: Insufficient documentation

## 2013-10-14 DIAGNOSIS — R011 Cardiac murmur, unspecified: Secondary | ICD-10-CM | POA: Insufficient documentation

## 2013-10-14 DIAGNOSIS — Z8619 Personal history of other infectious and parasitic diseases: Secondary | ICD-10-CM | POA: Insufficient documentation

## 2013-10-14 DIAGNOSIS — N12 Tubulo-interstitial nephritis, not specified as acute or chronic: Secondary | ICD-10-CM | POA: Insufficient documentation

## 2013-10-14 LAB — URINE MICROSCOPIC-ADD ON

## 2013-10-14 LAB — URINALYSIS, ROUTINE W REFLEX MICROSCOPIC
Bilirubin Urine: NEGATIVE
Glucose, UA: NEGATIVE mg/dL
Ketones, ur: NEGATIVE mg/dL
NITRITE: NEGATIVE
PROTEIN: 100 mg/dL — AB
SPECIFIC GRAVITY, URINE: 1.013 (ref 1.005–1.030)
UROBILINOGEN UA: 0.2 mg/dL (ref 0.0–1.0)
pH: 7 (ref 5.0–8.0)

## 2013-10-14 LAB — WET PREP, GENITAL
Clue Cells Wet Prep HPF POC: NONE SEEN
Trich, Wet Prep: NONE SEEN
WBC WET PREP: NONE SEEN
Yeast Wet Prep HPF POC: NONE SEEN

## 2013-10-14 LAB — POC URINE PREG, ED: Preg Test, Ur: NEGATIVE

## 2013-10-14 MED ORDER — CIPROFLOXACIN HCL 750 MG PO TABS: 750.0000 mg | ORAL_TABLET | Freq: Every day | ORAL | Status: DC

## 2013-10-14 MED ORDER — ONDANSETRON HCL 4 MG/2ML IJ SOLN
4.0000 mg | Freq: Once | INTRAMUSCULAR | Status: DC
  Filled 2013-10-14: qty 2

## 2013-10-14 MED ORDER — DEXTROSE 5 % IV SOLN
1.0000 g | Freq: Once | INTRAVENOUS | Status: AC
  Administered 2013-10-14: 1 g via INTRAVENOUS
  Filled 2013-10-14: qty 10

## 2013-10-14 MED ORDER — ACETAMINOPHEN 500 MG PO TABS
500.0000 mg | ORAL_TABLET | Freq: Once | ORAL | Status: AC
  Administered 2013-10-14: 500 mg via ORAL
  Filled 2013-10-14: qty 1

## 2013-10-14 MED ORDER — SODIUM CHLORIDE 0.9 % IV BOLUS (SEPSIS)
1000.0000 mL | Freq: Once | INTRAVENOUS | Status: AC
  Administered 2013-10-14: 1000 mL via INTRAVENOUS

## 2013-10-14 NOTE — ED Notes (Signed)
Pt denies nausea. Pt refused nausea medication.

## 2013-10-14 NOTE — ED Notes (Signed)
Erica [name] arrrived to begin procedure in room

## 2013-10-14 NOTE — ED Notes (Signed)
She states she feels much better; and capably ambulates to b.r. At this time.

## 2013-10-14 NOTE — ED Notes (Signed)
Pt reports LLQ pain starting last night with n/v/d. Pt denies blood in vomit/stool. Pt reports vaginal dryness and states usually has drying with yeast infection. Pt denies other symptoms.

## 2013-10-14 NOTE — ED Notes (Signed)
Pt in nearby restroom to attempt urine sample.

## 2013-10-14 NOTE — Discharge Instructions (Signed)
You have a urinary tract infection with fever. You will be treated with antibiotics for 5 days. Seek immediate care if you develop worsening pain, nausea/vomiting, or high fevers.  Take tylenol as needed every 4-6 hours as needed for pain and fever.   Pyelonephritis, Adult Pyelonephritis is a kidney infection. A kidney infection can happen quickly, or it can last for a long time. HOME CARE   Take your medicine (antibiotics) as told. Finish it even if you start to feel better.  Keep all doctor visits as told.  Drink enough fluids to keep your pee (urine) clear or pale yellow.  Only take medicine as told by your doctor. GET HELP RIGHT AWAY IF:   You have a fever or lasting symptoms for more than 2-3 days.  You have a fever and your symptoms suddenly get worse.  You cannot take your medicine or drink fluids as told.  You have chills and shaking.  You feel very weak or pass out (faint).  You do not feel better after 2 days. MAKE SURE YOU:  Understand these instructions.  Will watch your condition.  Will get help right away if you are not doing well or get worse. Document Released: 06/21/2004 Document Revised: 11/13/2011 Document Reviewed: 11/01/2010 [facility] Patient Information 2014 [place], [place].

## 2013-10-14 NOTE — ED Provider Notes (Signed)
CSN: [id number]     Arrival date & time 10/14/13  1013 History   First MD Initiated Contact with Patient 10/14/13 1018     Chief Complaint  Patient presents with  . Abdominal Pain    HPI  18 y.o. female with 7/10 crampy LLQ pain starting last night after eating at a restaurant, with nausea, 1 episode of emesis, and 2 episodes of diarrhea. Denies blood in vomit or stool. Has been febrile since yesterday, with yesterday's temp 103F. She attributes this to a cold she has been dealing with as she has cough, congestion, and rhinorrhea. She denies dysuria, vaginal discharge, dyspareunia, rash, or joint swelling. She denies relationship to food, though she has not been able to eat much today. She has been able to tolerate liquids. She denies chest pain, dyspnea, dizziness, or syncope. No one else who ate at the restaurant has felt sick. She is sexually active with husband who is her only partner over the last year, and she thinks she is is only partner. She denies h/o STD. Denies sick contacts. Some worsening of pain with valsalva.  Past Medical History  Diagnosis Date  . Ganglion cyst of wrist     left  . Yeast infection 04/07/2013   Past Surgical History  Procedure Laterality Date  . No past surgeries     Family History  Problem Relation Age of Onset  . Diabetes Maternal Grandmother   . Diabetes Paternal Grandmother   . Multiple sclerosis Cousin    History  Substance Use Topics  . Smoking status: Never Smoker   . Smokeless tobacco: Never Used  . Alcohol Use: No   OB History   Grav Para Term Preterm Abortions TAB SAB Ect Mult Living   1 1 1       1     LMP early May.  Review of Systems  All other systems reviewed and are negative.   Allergies  No known allergies  Home Medications   Prior to Admission medications   Medication Sig Start Date End Date Taking? Authorizing Provider  acetaminophen (TYLENOL) 500 MG tablet Take 1,000 mg by mouth daily as needed.    Historical  Provider, MD  ibuprofen (ADVIL,MOTRIN) 600 MG tablet Take 1 tablet (600 mg total) by mouth every 6 (six) hours as needed for mild pain, moderate pain or cramping. 06/28/13   Marge DuncansKimberly Randall Booker, CNM  oxyCODONE-acetaminophen (PERCOCET) 10-325 MG per tablet Take 1 tablet by mouth every 4 (four) hours as needed for pain.    Historical Provider, MD   BP 124/64  Pulse 104  Temp(Src) 99.7 F (37.6 C) (Oral)  Resp 20  SpO2 100%  LMP 09/25/2013 Physical Exam GEN: NAD, pleasant HEENT: Atraumatic, normocephalic, neck supple, EOMI, sclera clear, PERRL, o/p mildly erythematous CV: RRR, II/VI systolic murmur heard best LUSB, 2+ bilateral DP pulses PULM: CTAB, normal effort ABD: Soft, moderately tender LLQ, no guarding or rebound tenderness, nondistended, NABS, no organomegaly SKIN: No rash or cyanosis; warm and well-perfused EXTR: No lower extremity edema or calf tenderness PSYCH: Mood and affect euthymic, normal rate and volume of speech NEURO: Awake, alert, no focal deficits grossly, normal speech PELVIC: Normal appearing vaginal mucosa and cervix, mild white discharge, no cervical motion tenderness, no uterine or adnexal fullness  ED Course  Procedures (including critical care time) Labs Review Labs Reviewed  URINALYSIS, ROUTINE W REFLEX MICROSCOPIC - Abnormal; Notable for the following:    APPearance TURBID (*)    Hgb urine dipstick MODERATE (*)  Protein, ur 100 (*)    Leukocytes, UA LARGE (*)    All other components within normal limits  WET PREP, GENITAL  GC/CHLAMYDIA PROBE AMP  URINE CULTURE  URINE MICROSCOPIC-ADD ON  POC URINE PREG, ED    Imaging Review [facility]  10/14/2013   CLINICAL DATA:  Abdominal pain  EXAM: RENAL/URINARY TRACT ULTRASOUND COMPLETE  COMPARISON:  None.  FINDINGS: Right Kidney:  Length: 11.4 cm. Echogenicity within normal limits. No mass or hydronephrosis visualized.  Left Kidney:  Length: 11.3 cm. Echogenicity within normal limits. No mass or  hydronephrosis visualized.  Bladder:  Appears normal for degree of bladder distention.  IMPRESSION: Normal renal ultrasound.   Electronically Signed   By: Elige KoHetal  Patel   On: 10/14/2013 14:49     EKG Interpretation None     MDM   Final diagnoses:  Pyelonephritis   18 y.o. female with LLQ tenderness, nausea, emesis, and diarrhea in setting of likely URI infection x 1-2 days. DDx includes viral gastroenteritis, pyelonephritis, pregnancy/ectopic, appendicitis with vague abdominal pain, or PID. Pt is well-appearing with nonacute abdomen and tolerating PO but febrile and mildly tachycardic in the ED. Upreg neg. UA with moderate Hgb and large leuks but no nitrites. Will treat for pyelonephritis given UA findings, lower abdominal/suprapubic pain with fever. - rocephin 1g IV and continue ciprofloxacin x 5 days. - Urine culture - 1L NS - Zofran - GC/chlamydia - Wet prep negative - 102F in ED - tylenol 500mg  x 1 in ED with improved temperature to 23F. - Renal US normal  Systolic murmur - No chest pain or dyspnea. F/u with PCP. Return precautions for ovarian torsion or appendicitis discussed.  Leona SingletonMaria T Iyona Pehrson, MD PGY-2, [facility]   Leona SingletonMaria T Anetha Slagel, MD 10/14/13 [phone]

## 2013-10-14 NOTE — ED Notes (Signed)
Her U/S is begun at this time.

## 2013-10-14 NOTE — ED Provider Notes (Signed)
I saw and evaluated the patient, reviewed the resident's note and I agree with the findings and plan.   EKG Interpretation None      Will tx for pyelo. No hydro noted. Symptoms improved. Urine culture sent  [facility]  10/14/2013   CLINICAL DATA:  Abdominal pain  EXAM: RENAL/URINARY TRACT ULTRASOUND COMPLETE  COMPARISON:  None.  FINDINGS: Right Kidney:  Length: 11.4 cm. Echogenicity within normal limits. No mass or hydronephrosis visualized.  Left Kidney:  Length: 11.3 cm. Echogenicity within normal limits. No mass or hydronephrosis visualized.  Bladder:  Appears normal for degree of bladder distention.  IMPRESSION: Normal renal ultrasound.   Electronically Signed   By: Elige KoHetal  Patel   On: 10/14/2013 14:49  I personally reviewed the imaging tests through PACS system I reviewed available ER/hospitalization records through the EMR  Results for orders placed during the hospital encounter of 10/14/13  WET PREP, GENITAL      Result Value Ref Range   Yeast Wet Prep HPF POC NONE SEEN  NONE SEEN   Trich, Wet Prep NONE SEEN  NONE SEEN   Clue Cells Wet Prep HPF POC NONE SEEN  NONE SEEN   WBC, Wet Prep HPF POC NONE SEEN  NONE SEEN  URINALYSIS, ROUTINE W REFLEX MICROSCOPIC      Result Value Ref Range   Color, Urine YELLOW  YELLOW   APPearance TURBID (*) CLEAR   Specific Gravity, Urine 1.013  1.005 - 1.030   pH 7.0  5.0 - 8.0   Glucose, UA NEGATIVE  NEGATIVE mg/dL   Hgb urine dipstick MODERATE (*) NEGATIVE   Bilirubin Urine NEGATIVE  NEGATIVE   Ketones, ur NEGATIVE  NEGATIVE mg/dL   Protein, ur 782100 (*) NEGATIVE mg/dL   Urobilinogen, UA 0.2  0.0 - 1.0 mg/dL   Nitrite NEGATIVE  NEGATIVE   Leukocytes, UA LARGE (*) NEGATIVE  URINE MICROSCOPIC-ADD ON      Result Value Ref Range   WBC, UA 21-50  <3 WBC/hpf   RBC / HPF 3-6  <3 RBC/hpf   Bacteria, UA RARE  RARE  POC URINE PREG, ED      Result Value Ref Range   Preg Test, Ur NEGATIVE  NEGATIVE     Lyanne CoKevin M Jaycee Mckellips, MD 10/14/13 1547

## 2013-10-15 LAB — GC/CHLAMYDIA PROBE AMP
CT Probe RNA: NEGATIVE
GC Probe RNA: NEGATIVE

## 2013-10-16 LAB — URINE CULTURE: Colony Count: >=100000

## 2013-10-18 ENCOUNTER — Telehealth (HOSPITAL_BASED_OUTPATIENT_CLINIC_OR_DEPARTMENT_OTHER): Payer: Self-pay | Admitting: Emergency Medicine

## 2013-10-18 NOTE — Telephone Encounter (Signed)
+  Urine. Patient treated with Cipro. Treatment okay per pharmacist.

## 2013-12-14 ENCOUNTER — Other Ambulatory Visit: Payer: Self-pay | Admitting: Obstetrics and Gynecology

## 2013-12-14 ENCOUNTER — Ambulatory Visit (INDEPENDENT_AMBULATORY_CARE_PROVIDER_SITE_OTHER): Payer: Medicaid Other | Admitting: Adult Health

## 2013-12-14 ENCOUNTER — Encounter: Payer: Self-pay | Admitting: Adult Health

## 2013-12-14 DIAGNOSIS — O3680X0 Pregnancy with inconclusive fetal viability, not applicable or unspecified: Secondary | ICD-10-CM

## 2013-12-14 DIAGNOSIS — Z3201 Encounter for pregnancy test, result positive: Secondary | ICD-10-CM

## 2013-12-14 LAB — POCT URINE PREGNANCY: PREG TEST UR: POSITIVE

## 2013-12-14 NOTE — Progress Notes (Signed)
Pt here for pregnancy test. Positive result. Pt has had some cramping off and on. No spotting. Advised cramping and spotting can be common in early pregnancy, that's just everything getting settled into place. Advised when she notices this, to push fluids and take it easy, and call office to let us know. Pt mentioned a vaginal discharge with no odor. I offered to give pt an appt, but she wanted to wait until New OB appt. Pt voiced understanding. JSY

## 2013-12-15 ENCOUNTER — Other Ambulatory Visit: Payer: Self-pay | Admitting: Obstetrics and Gynecology

## 2013-12-15 ENCOUNTER — Ambulatory Visit (INDEPENDENT_AMBULATORY_CARE_PROVIDER_SITE_OTHER): Payer: Medicaid Other

## 2013-12-15 DIAGNOSIS — O26849 Uterine size-date discrepancy, unspecified trimester: Secondary | ICD-10-CM

## 2013-12-15 DIAGNOSIS — O3680X0 Pregnancy with inconclusive fetal viability, not applicable or unspecified: Secondary | ICD-10-CM

## 2013-12-15 NOTE — Progress Notes (Signed)
U/S-single IUP with +FCA noted, FHR-162 bpm, CRL c/w 8+0wks EDD 07/27/2014, cx appears closed, bilateral adnexa appears WNL

## 2013-12-23 ENCOUNTER — Ambulatory Visit (INDEPENDENT_AMBULATORY_CARE_PROVIDER_SITE_OTHER): Payer: Medicaid Other | Admitting: Advanced Practice Midwife

## 2013-12-23 ENCOUNTER — Encounter: Payer: Self-pay | Admitting: Advanced Practice Midwife

## 2013-12-23 VITALS — BP 108/60 | Wt 147.0 lb

## 2013-12-23 DIAGNOSIS — Z331 Pregnant state, incidental: Secondary | ICD-10-CM

## 2013-12-23 DIAGNOSIS — Z3490 Encounter for supervision of normal pregnancy, unspecified, unspecified trimester: Secondary | ICD-10-CM

## 2013-12-23 DIAGNOSIS — Z3481 Encounter for supervision of other normal pregnancy, first trimester: Secondary | ICD-10-CM

## 2013-12-23 DIAGNOSIS — Z862 Personal history of diseases of the blood and blood-forming organs and certain disorders involving the immune mechanism: Secondary | ICD-10-CM

## 2013-12-23 DIAGNOSIS — Z3480 Encounter for supervision of other normal pregnancy, unspecified trimester: Secondary | ICD-10-CM

## 2013-12-23 DIAGNOSIS — D573 Sickle-cell trait: Secondary | ICD-10-CM

## 2013-12-23 DIAGNOSIS — Z1389 Encounter for screening for other disorder: Secondary | ICD-10-CM

## 2013-12-23 LAB — CBC
HCT: 35.7 % — ABNORMAL LOW (ref 36.0–46.0)
Hemoglobin: 12.2 g/dL (ref 12.0–15.0)
MCH: 26.6 pg (ref 26.0–34.0)
MCHC: 34.2 g/dL (ref 30.0–36.0)
MCV: 77.9 fL — ABNORMAL LOW (ref 78.0–100.0)
PLATELETS: 318 10*3/uL (ref 150–400)
RBC: 4.58 MIL/uL (ref 3.87–5.11)
RDW: 15.1 % (ref 11.5–15.5)
WBC: 8.3 10*3/uL (ref 4.0–10.5)

## 2013-12-23 LAB — POCT URINALYSIS DIPSTICK
Blood, UA: NEGATIVE
Glucose, UA: NEGATIVE
Ketones, UA: NEGATIVE
Nitrite, UA: POSITIVE
PROTEIN UA: NEGATIVE

## 2013-12-23 MED ORDER — NITROFURANTOIN MONOHYD MACRO 100 MG PO CAPS: 100.0000 mg | ORAL_CAPSULE | Freq: Two times a day (BID) | ORAL | Status: AC

## 2013-12-23 NOTE — Progress Notes (Signed)
  Subjective:    Erica [name] is a G2P1001 4643w1d being seen today for her first obstetrical visit.  Her obstetrical history is significant for short interval b/t pregnancies (has 455 month old).  Pregnancy history fully reviewed.  Husband in jail, pt won't really say why. She has a black eye, but "Swears it's not from him-I fell on his knee when we were rough housing"  Patient reports no complaints. Used monistat for some vaginal itchiing a week ago, cleared up  Filed Vitals:   12/23/13 1452  BP: 108/60  Weight: 147 lb (66.679 kg)    HISTORY: OB History  Gravida Para Term Preterm AB SAB TAB Ectopic Multiple Living  2 1 1       1     # Outcome Date GA Lbr Len/2nd Weight Sex Delivery Anes PTL Lv  2 CUR           1 TRM 06/27/13 2571w0d 07:30 / 00:04 6 lb 13.4 oz (3.1 kg) F SVD None  Y     Past Medical History  Diagnosis Date  . Ganglion cyst of wrist     left  . Yeast infection 04/07/2013   Past Surgical History  Procedure Laterality Date  . No past surgeries     Family History  Problem Relation Age of Onset  . Diabetes Maternal Grandmother   . Diabetes Paternal Grandmother   . Multiple sclerosis Cousin      Exam    Face Left check swollen and eye black from "fall" on Saturday  Pelvic Exam:    Perineum: Normal Perineum   Vulva: normal   Vagina:  normal mucosa, normal discharge, no palpable nodules   Uterus Normal, Gravid, FH: 9 weeks     Cervix: Non friable   Adnexa: Not palpable   Urinary:  urethral meatus normal    System: Breast:  normal appearance, no masses or tenderness   Skin: normal coloration and turgor, no rashes    Neurologic: oriented, normal, normal mood   Extremities: normal strength, tone, and muscle mass   HEENT PERRLA   Mouth/Teeth mucous membranes moist, normal dentition   Neck supple and no masses   Cardiovascular: regular rate and rhythm   Respiratory:  appears well, vitals normal, no respiratory distress, acyanotic   Abdomen: soft,  non-tender;  FHR: 150          Assessment:    Pregnancy: G2P1001 Patient Active Problem List   Diagnosis Date Noted  . Sickle cell trait 03/17/2013        Plan:     Initial labs drawn. Continue prenatal vitamins  Macrobid 100mg  BID for ASB (nitrites) Problem list reviewed and updated  Reviewed recommended weight gain based on pre-gravid BMI  Encouraged well-balanced diet Genetic Screening discussed Integrated Screen: requested.  Ultrasound discussed; fetal survey: requested.  Follow up in 3 weeks for NT/IT.  CRESENZO-DISHMAN,Londan Coplen 12/23/2013

## 2013-12-24 LAB — URINALYSIS, ROUTINE W REFLEX MICROSCOPIC
BILIRUBIN URINE: NEGATIVE
GLUCOSE, UA: NEGATIVE mg/dL
Hgb urine dipstick: NEGATIVE
KETONES UR: NEGATIVE mg/dL
Nitrite: POSITIVE — AB
PH: 7 (ref 5.0–8.0)
Protein, ur: NEGATIVE mg/dL
SPECIFIC GRAVITY, URINE: 1.017 (ref 1.005–1.030)
Urobilinogen, UA: 0.2 mg/dL (ref 0.0–1.0)

## 2013-12-24 LAB — DRUG SCREEN, URINE, NO CONFIRMATION
AMPHETAMINE SCRN UR: NEGATIVE
Barbiturate Quant, Ur: NEGATIVE
Benzodiazepines.: NEGATIVE
Cocaine Metabolites: NEGATIVE
Creatinine,U: 158.5 mg/dL
MARIJUANA METABOLITE: NEGATIVE
METHADONE: NEGATIVE
OPIATE SCREEN, URINE: NEGATIVE
PROPOXYPHENE: NEGATIVE
Phencyclidine (PCP): NEGATIVE

## 2013-12-24 LAB — URINALYSIS, MICROSCOPIC ONLY
Bacteria, UA: NONE SEEN
CRYSTALS: NONE SEEN
Casts: NONE SEEN

## 2013-12-24 LAB — GC/CHLAMYDIA PROBE AMP
CT Probe RNA: NEGATIVE
GC Probe RNA: NEGATIVE

## 2013-12-24 LAB — HIV ANTIBODY (ROUTINE TESTING W REFLEX): HIV 1&2 Ab, 4th Generation: NONREACTIVE

## 2013-12-24 LAB — RPR

## 2013-12-24 LAB — OXYCODONE SCREEN, UA, RFLX CONFIRM: OXYCODONE SCRN UR: NEGATIVE ng/mL

## 2013-12-24 LAB — HEPATITIS B SURFACE ANTIGEN: Hepatitis B Surface Ag: NEGATIVE

## 2013-12-24 LAB — RUBELLA SCREEN: Rubella: 1.79 Index — ABNORMAL HIGH (ref <0.90)

## 2013-12-24 LAB — VARICELLA ZOSTER ANTIBODY, IGG: Varicella IgG: 87.61 Index (ref <135.00)

## 2013-12-24 LAB — ANTIBODY SCREEN: Antibody Screen: NEGATIVE

## 2013-12-25 LAB — HEMOGLOBINOPATHY EVALUATION
HEMOGLOBIN OTHER: 0 %
Hgb A2 Quant: 3.2 % (ref 2.2–3.2)
Hgb A: 57.3 % — ABNORMAL LOW (ref 96.8–97.8)
Hgb F Quant: 0 % (ref 0.0–2.0)
Hgb S Quant: 39.5 % — ABNORMAL HIGH

## 2013-12-25 LAB — URINE CULTURE

## 2013-12-25 LAB — CYSTIC FIBROSIS DIAGNOSTIC STUDY

## 2014-01-13 ENCOUNTER — Ambulatory Visit (INDEPENDENT_AMBULATORY_CARE_PROVIDER_SITE_OTHER): Payer: Medicaid Other

## 2014-01-13 ENCOUNTER — Other Ambulatory Visit: Payer: Self-pay | Admitting: Advanced Practice Midwife

## 2014-01-13 ENCOUNTER — Ambulatory Visit (INDEPENDENT_AMBULATORY_CARE_PROVIDER_SITE_OTHER): Payer: Self-pay | Admitting: Advanced Practice Midwife

## 2014-01-13 ENCOUNTER — Encounter: Payer: Self-pay | Admitting: Women's Health

## 2014-01-13 VITALS — BP 106/58 | Wt 146.0 lb

## 2014-01-13 DIAGNOSIS — Z3481 Encounter for supervision of other normal pregnancy, first trimester: Secondary | ICD-10-CM

## 2014-01-13 DIAGNOSIS — Z36 Encounter for antenatal screening of mother: Secondary | ICD-10-CM

## 2014-01-13 DIAGNOSIS — Z1389 Encounter for screening for other disorder: Secondary | ICD-10-CM

## 2014-01-13 DIAGNOSIS — Z3480 Encounter for supervision of other normal pregnancy, unspecified trimester: Secondary | ICD-10-CM

## 2014-01-13 DIAGNOSIS — Z331 Pregnant state, incidental: Secondary | ICD-10-CM

## 2014-01-13 LAB — POCT URINALYSIS DIPSTICK
Glucose, UA: NEGATIVE
KETONES UA: NEGATIVE
Nitrite, UA: NEGATIVE
PROTEIN UA: NEGATIVE
RBC UA: NEGATIVE

## 2014-01-13 NOTE — Progress Notes (Signed)
U/S(12+1wks)-single IUP with +FCA noted, FHR-152 bpm, cx appears closed(3.3cm), posterior Gr 0 placenta, bilateral adnexa appears WNL, NB present, NT-2.633mm, CRL c/w dates

## 2014-01-13 NOTE — Progress Notes (Signed)
G2P1001 3026w1d Estimated Date of Delivery: 07/27/14  Blood pressure 106/58, weight 146 lb (66.225 kg), last menstrual period 09/25/2013, not currently breastfeeding.   BP weight and urine results all reviewed and noted.  Please refer to the obstetrical flow sheet for the fundal height and fetal heart rate documentation:  Patient reports good fetal movement, denies any bleeding and no rupture of membranes symptoms or regular contractions. Patient is without complaints. All questions were answered.  Plan:  Continued routine obstetrical care, NT/IT today  Follow up in 4 weeks for OB appointment, 2nd IT

## 2014-01-20 LAB — MATERNAL SCREEN, INTEGRATED #1

## 2014-02-10 ENCOUNTER — Encounter: Payer: Medicaid Other | Admitting: Advanced Practice Midwife

## 2014-02-16 ENCOUNTER — Ambulatory Visit (INDEPENDENT_AMBULATORY_CARE_PROVIDER_SITE_OTHER): Payer: Medicaid Other | Admitting: Advanced Practice Midwife

## 2014-02-16 VITALS — BP 108/62 | Wt 150.0 lb

## 2014-02-16 DIAGNOSIS — Z3482 Encounter for supervision of other normal pregnancy, second trimester: Secondary | ICD-10-CM

## 2014-02-16 DIAGNOSIS — R8290 Unspecified abnormal findings in urine: Secondary | ICD-10-CM

## 2014-02-16 DIAGNOSIS — Z36 Encounter for antenatal screening of mother: Secondary | ICD-10-CM

## 2014-02-16 DIAGNOSIS — Z3490 Encounter for supervision of normal pregnancy, unspecified, unspecified trimester: Secondary | ICD-10-CM

## 2014-02-16 DIAGNOSIS — Z3480 Encounter for supervision of other normal pregnancy, unspecified trimester: Secondary | ICD-10-CM

## 2014-02-16 MED ORDER — AZITHROMYCIN 250 MG PO TABS: 250.0000 mg | ORAL_TABLET | Freq: Every day | ORAL | Status: DC

## 2014-02-16 NOTE — Progress Notes (Signed)
G2P1001 [redacted]w[redacted]d Estimated Date of Delivery: 07/27/14  Blood pressure 108/62, weight 150 lb (68.04 kg), last menstrual period 09/25/2013, not currently breastfeeding.   BP weight and urine results all reviewed and noted.  Please refer to the obstetrical flow sheet for the fundal height and fetal heart rate documentation:  Patient reports good fetal movement, denies any bleeding and no rupture of membranes symptoms or regular contractions. Patient C/O scratch throat, sinus pressure, productive cough for 10 days.  Throat erythemous, ears fine All questions were answered.  Plan:  Continued routine obstetrical care, 2nd IT. Zpack/zyrtec  Follow up in 2 weeks for OB appointment, anatomy scan

## 2014-02-16 NOTE — Patient Instructions (Signed)
[facility] Address: [address] [place], [place], [place] [address] [phone]

## 2014-02-18 LAB — URINE CULTURE

## 2014-02-23 LAB — MATERNAL SCREEN, INTEGRATED #2
AFP MoM: 0.78
AFP, Serum: 33.5 ng/mL
CALCULATED GESTATIONAL AGE MAT SCREEN: 17.1
Crown Rump Length: 59.8 mm
Estriol Mom: 0.98
Estriol, Free: 1.06 ng/mL
INHIBIN A MOM MAT SCREEN: 0.75
Inhibin A Dimeric: 121 pg/mL
MSS Trisomy 18 Risk: <1:5000 {titer}
NT MoM: 1.67
NUMBER OF FETUSES MAT SCREEN 2: 1
Nuchal Translucency: 2.3 mm
PAPP-A MAT SCREEN: 402 ng/mL
PAPP-A MoM: 0.46
Rish for ONTD: <1:5000 {titer}
hCG MoM: 1.22
hCG, Serum: 40.9 IU/mL

## 2014-02-25 ENCOUNTER — Other Ambulatory Visit: Payer: Self-pay | Admitting: Obstetrics & Gynecology

## 2014-02-25 DIAGNOSIS — Z1389 Encounter for screening for other disorder: Secondary | ICD-10-CM

## 2014-03-01 ENCOUNTER — Encounter: Payer: Self-pay | Admitting: Women's Health

## 2014-03-01 ENCOUNTER — Other Ambulatory Visit: Payer: Self-pay | Admitting: Obstetrics & Gynecology

## 2014-03-01 ENCOUNTER — Ambulatory Visit (INDEPENDENT_AMBULATORY_CARE_PROVIDER_SITE_OTHER): Payer: Medicaid Other | Admitting: Women's Health

## 2014-03-01 ENCOUNTER — Ambulatory Visit (INDEPENDENT_AMBULATORY_CARE_PROVIDER_SITE_OTHER): Payer: Medicaid Other

## 2014-03-01 ENCOUNTER — Encounter: Payer: Medicaid Other | Admitting: Women's Health

## 2014-03-01 VITALS — BP 112/64 | Wt 150.0 lb

## 2014-03-01 DIAGNOSIS — Z3482 Encounter for supervision of other normal pregnancy, second trimester: Secondary | ICD-10-CM

## 2014-03-01 DIAGNOSIS — O09899 Supervision of other high risk pregnancies, unspecified trimester: Secondary | ICD-10-CM

## 2014-03-01 DIAGNOSIS — O0932 Supervision of pregnancy with insufficient antenatal care, second trimester: Secondary | ICD-10-CM

## 2014-03-01 DIAGNOSIS — Z23 Encounter for immunization: Secondary | ICD-10-CM

## 2014-03-01 DIAGNOSIS — Z1389 Encounter for screening for other disorder: Secondary | ICD-10-CM

## 2014-03-01 DIAGNOSIS — Z36 Encounter for antenatal screening of mother: Secondary | ICD-10-CM

## 2014-03-01 DIAGNOSIS — Z331 Pregnant state, incidental: Secondary | ICD-10-CM

## 2014-03-01 DIAGNOSIS — Z363 Encounter for antenatal screening for malformations: Secondary | ICD-10-CM

## 2014-03-01 DIAGNOSIS — O09892 Supervision of other high risk pregnancies, second trimester: Secondary | ICD-10-CM

## 2014-03-01 LAB — POCT URINALYSIS DIPSTICK
GLUCOSE UA: NEGATIVE
Ketones, UA: NEGATIVE
Leukocytes, UA: NEGATIVE
NITRITE UA: NEGATIVE
Protein, UA: NEGATIVE
RBC UA: NEGATIVE

## 2014-03-01 MED ORDER — CONCEPT DHA 53.5-38-1 MG PO CAPS: 1.0000 | ORAL_CAPSULE | Freq: Every day | ORAL | Status: DC

## 2014-03-01 MED ORDER — CONCEPT DHA 53.5-38-1 MG PO CAPS
1.0000 | ORAL_CAPSULE | Freq: Every day | ORAL | Status: DC
Start: 2014-03-01 — End: 2014-07-18

## 2014-03-01 NOTE — Progress Notes (Signed)
Low-risk OB appointment G2P1001 6338w6d Estimated Date of Delivery: 07/27/14 BP 112/64  Wt 150 lb (68.04 kg)  LMP 09/25/2013  BP, weight, and urine reviewed.  Refer to obstetrical flow sheet for FH & FHR.  Coming back later this afternoon for anatomy u/s Reports good fm.  Denies regular uc's, lof, vb, or uti s/s. No complaints. States she hasn't had money to get flinstones, but does now, couldn't take regular pnv last pregnancy b/c they were too big, will rx concept dha for her to try.  Reviewed ptl s/s, fm. Plan:  Continue routine obstetrical care  F/U in 4wks for OB appointment  Flu shot today

## 2014-03-01 NOTE — Progress Notes (Signed)
U/S(18+6wks)-active fetus, meas c/w dates, fluid wnl, posterior gr 0 placenta, cx appears closed , bilateral adnexa appears wnl, FHR-140 bpm, no major abnl noted, female fetus

## 2014-03-01 NOTE — Patient Instructions (Signed)
Second Trimester of Pregnancy The second trimester is from week 13 through week 28, months 4 through 6. The second trimester is often a time when you feel your best. Your body has also adjusted to being pregnant, and you begin to feel better physically. Usually, morning sickness has lessened or quit completely, you may have more energy, and you may have an increase in appetite. The second trimester is also a time when the fetus is growing rapidly. At the end of the sixth month, the fetus is about 9 inches long and weighs about 1 pounds. You will likely begin to feel the baby move (quickening) between 18 and 20 weeks of the pregnancy. BODY CHANGES Your body goes through many changes during pregnancy. The changes vary from woman to woman.   Your weight will continue to increase. You will notice your lower abdomen bulging out.  You may begin to get stretch marks on your hips, abdomen, and breasts.  You may develop headaches that can be relieved by medicines approved by your health care provider.  You may urinate more often because the fetus is pressing on your bladder.  You may develop or continue to have heartburn as a result of your pregnancy.  You may develop constipation because certain hormones are causing the muscles that push waste through your intestines to slow down.  You may develop hemorrhoids or swollen, bulging veins (varicose veins).  You may have back pain because of the weight gain and pregnancy hormones relaxing your joints between the bones in your pelvis and as a result of a shift in weight and the muscles that support your balance.  Your breasts will continue to grow and be tender.  Your gums may bleed and may be sensitive to brushing and flossing.  Dark spots or blotches (chloasma, mask of pregnancy) may develop on your face. This will likely fade after the baby is born.  A dark line from your belly button to the pubic area (linea nigra) may appear. This will likely fade  after the baby is born.  You may have changes in your hair. These can include thickening of your hair, rapid growth, and changes in texture. Some women also have hair loss during or after pregnancy, or hair that feels dry or thin. Your hair will most likely return to normal after your baby is born. WHAT TO EXPECT AT YOUR PRENATAL VISITS During a routine prenatal visit:  You will be weighed to make sure you and the fetus are growing normally.  Your blood pressure will be taken.  Your abdomen will be measured to track your baby's growth.  The fetal heartbeat will be listened to.  Any test results from the previous visit will be discussed. Your health care provider may ask you:  How you are feeling.  If you are feeling the baby move.  If you have had any abnormal symptoms, such as leaking fluid, bleeding, severe headaches, or abdominal cramping.  If you have any questions. Other tests that may be performed during your second trimester include:  Blood tests that check for:  Low iron levels (anemia).  Gestational diabetes (between 24 and 28 weeks).  Rh antibodies.  Urine tests to check for infections, diabetes, or protein in the urine.  An ultrasound to confirm the proper growth and development of the baby.  An amniocentesis to check for possible genetic problems.  Fetal screens for spina bifida and Down syndrome. HOME CARE INSTRUCTIONS   Avoid all smoking, herbs, alcohol, and unprescribed   drugs. These chemicals affect the formation and growth of the baby.  Follow your health care provider's instructions regarding medicine use. There are medicines that are either safe or unsafe to take during pregnancy.  Exercise only as directed by your health care provider. Experiencing uterine cramps is a good sign to stop exercising.  Continue to eat regular, healthy meals.  Wear a good support bra for breast tenderness.  Do not use hot tubs, steam rooms, or saunas.  Wear your  seat belt at all times when driving.  Avoid raw meat, uncooked cheese, cat litter boxes, and soil used by cats. These carry germs that can cause birth defects in the baby.  Take your prenatal vitamins.  Try taking a stool softener (if your health care provider approves) if you develop constipation. Eat more high-fiber foods, such as fresh vegetables or fruit and whole grains. Drink plenty of fluids to keep your urine clear or pale yellow.  Take warm sitz baths to soothe any pain or discomfort caused by hemorrhoids. Use hemorrhoid cream if your health care provider approves.  If you develop varicose veins, wear support hose. Elevate your feet for 15 minutes, 3-4 times a day. Limit salt in your diet.  Avoid heavy lifting, wear low heel shoes, and practice good posture.  Rest with your legs elevated if you have leg cramps or low back pain.  Visit your dentist if you have not gone yet during your pregnancy. Use a soft toothbrush to brush your teeth and be gentle when you floss.  A sexual relationship may be continued unless your health care provider directs you otherwise.  Continue to go to all your prenatal visits as directed by your health care provider. SEEK MEDICAL CARE IF:   You have dizziness.  You have mild pelvic cramps, pelvic pressure, or nagging pain in the abdominal area.  You have persistent nausea, vomiting, or diarrhea.  You have a bad smelling vaginal discharge.  You have pain with urination. SEEK IMMEDIATE MEDICAL CARE IF:   You have a fever.  You are leaking fluid from your vagina.  You have spotting or bleeding from your vagina.  You have severe abdominal cramping or pain.  You have rapid weight gain or loss.  You have shortness of breath with chest pain.  You notice sudden or extreme swelling of your face, hands, ankles, feet, or legs.  You have not felt your baby move in over an hour.  You have severe headaches that do not go away with  medicine.  You have vision changes. Document Released: 05/08/2001 Document Revised: 05/19/2013 Document Reviewed: 07/15/2012 Exit[employer] Patient Information 2015 [employer]. This information is not intended to replace advice given to you by your health care provider. Make sure you discuss any questions you have with your health care provider.  

## 2014-03-02 ENCOUNTER — Encounter: Payer: Medicaid Other | Admitting: Women's Health

## 2014-03-29 ENCOUNTER — Encounter: Payer: Self-pay | Admitting: Women's Health

## 2014-03-29 ENCOUNTER — Encounter: Payer: Self-pay | Admitting: *Deleted

## 2014-03-29 ENCOUNTER — Encounter: Payer: Medicaid Other | Admitting: Women's Health

## 2014-04-13 ENCOUNTER — Encounter: Payer: Medicaid Other | Admitting: Women's Health

## 2014-04-20 ENCOUNTER — Encounter: Payer: Medicaid Other | Admitting: Women's Health

## 2014-05-04 ENCOUNTER — Telehealth: Payer: Self-pay | Admitting: *Deleted

## 2014-05-04 ENCOUNTER — Ambulatory Visit (INDEPENDENT_AMBULATORY_CARE_PROVIDER_SITE_OTHER): Payer: Medicaid Other | Admitting: Women's Health

## 2014-05-04 ENCOUNTER — Encounter: Payer: Self-pay | Admitting: Women's Health

## 2014-05-04 VITALS — BP 108/54 | Wt 163.0 lb

## 2014-05-04 DIAGNOSIS — Z3483 Encounter for supervision of other normal pregnancy, third trimester: Secondary | ICD-10-CM

## 2014-05-04 DIAGNOSIS — N898 Other specified noninflammatory disorders of vagina: Secondary | ICD-10-CM

## 2014-05-04 DIAGNOSIS — Z331 Pregnant state, incidental: Secondary | ICD-10-CM

## 2014-05-04 DIAGNOSIS — O26893 Other specified pregnancy related conditions, third trimester: Secondary | ICD-10-CM

## 2014-05-04 DIAGNOSIS — L292 Pruritus vulvae: Secondary | ICD-10-CM

## 2014-05-04 DIAGNOSIS — Z1389 Encounter for screening for other disorder: Secondary | ICD-10-CM

## 2014-05-04 DIAGNOSIS — B373 Candidiasis of vulva and vagina: Secondary | ICD-10-CM

## 2014-05-04 DIAGNOSIS — B3731 Acute candidiasis of vulva and vagina: Secondary | ICD-10-CM

## 2014-05-04 DIAGNOSIS — O0930 Supervision of pregnancy with insufficient antenatal care, unspecified trimester: Secondary | ICD-10-CM

## 2014-05-04 LAB — POCT URINALYSIS DIPSTICK
Blood, UA: NEGATIVE
Glucose, UA: NEGATIVE
KETONES UA: NEGATIVE
Nitrite, UA: NEGATIVE
Protein, UA: NEGATIVE

## 2014-05-04 LAB — POCT WET PREP (WET MOUNT): CLUE CELLS WET PREP WHIFF POC: NEGATIVE

## 2014-05-04 MED ORDER — FLUCONAZOLE 150 MG PO TABS: 150.0000 mg | ORAL_TABLET | Freq: Once | ORAL | Status: DC

## 2014-05-04 NOTE — Patient Instructions (Signed)
You will have your sugar test next visit.  Please do not eat or drink anything after midnight the night before you come, not even water.  You will be here for at least two hours.     Call the office [phone]) or [facility] if:  You begin to have strong, frequent contractions  Your water breaks.  Sometimes it is a big gush of fluid, sometimes it is just a trickle that keeps getting your panties wet or running down your legs  You have vaginal bleeding.  It is normal to have a small amount of spotting if your cervix was checked.   You don't feel your baby moving like normal.  If you don't, get you something to eat and drink and lay down and focus on feeling your baby move.  You should feel at least 10 movements in 2 hours.  If you don't, you should call the office or [facility].    Monilial Vaginitis Vaginitis in a soreness, swelling and redness (inflammation) of the vagina and vulva. Monilial vaginitis is not a sexually transmitted infection. CAUSES  Yeast vaginitis is caused by yeast (candida) that is normally found in your vagina. With a yeast infection, the candida has overgrown in number to a point that upsets the chemical balance. SYMPTOMS   White, thick vaginal discharge.  Swelling, itching, redness and irritation of the vagina and possibly the lips of the vagina (vulva).  Burning or painful urination.  Painful intercourse. DIAGNOSIS  Things that may contribute to monilial vaginitis are:  Postmenopausal and virginal states.  Pregnancy.  Infections.  Being tired, sick or stressed, especially if you had monilial vaginitis in the past.  Diabetes. Good control will help lower the chance.  Birth control pills.  Tight fitting garments.  Using bubble bath, feminine sprays, douches or deodorant tampons.  Taking certain medications that kill germs (antibiotics).  Sporadic recurrence can occur if you become ill. TREATMENT  Your caregiver will give  you medication.  There are several kinds of anti monilial vaginal creams and suppositories specific for monilial vaginitis. For recurrent yeast infections, use a suppository or cream in the vagina 2 times a week, or as directed.  Anti-monilial or steroid cream for the itching or irritation of the vulva may also be used. Get your caregiver's permission.  Painting the vagina with methylene blue solution may help if the monilial cream does not work.  Eating yogurt may help prevent monilial vaginitis. HOME CARE INSTRUCTIONS   Finish all medication as prescribed.  Do not have sex until treatment is completed or after your caregiver tells you it is okay.  Take warm sitz baths.  Do not douche.  Do not use tampons, especially scented ones.  Wear cotton underwear.  Avoid tight pants and panty hose.  Tell your sexual partner that you have a yeast infection. They should go to their caregiver if they have symptoms such as mild rash or itching.  Your sexual partner should be treated as well if your infection is difficult to eliminate.  Practice safer sex. Use condoms.  Some vaginal medications cause latex condoms to fail. Vaginal medications that harm condoms are:  Cleocin cream.  Butoconazole (Femstat).  Terconazole (Terazol) vaginal suppository.  Miconazole (Monistat) (may be purchased over the counter). SEEK MEDICAL CARE IF:   You have a temperature by mouth above 102 F (38.9 C).  The infection is getting worse after 2 days of treatment.  The infection is not getting better  after 3 days of treatment.  You develop blisters in or around your vagina.  You develop vaginal bleeding, and it is not your menstrual period.  You have pain when you urinate.  You develop intestinal problems.  You have pain with sexual intercourse. Document Released: 02/21/2005 Document Revised: 08/06/2011 Document Reviewed: 11/05/2008 [facility] Patient Information 2015 [place]. This  information is not intended to replace advice given to you by your health care provider. Make sure you discuss any questions you have with your health care provider.

## 2014-05-04 NOTE — Progress Notes (Signed)
Low-risk OB appointment [id number] Estimated Date of Delivery: 07/27/14 BP 108/54 mmHg  Wt 163 lb (73.936 kg)  LMP 09/25/2013  BP, weight, and urine reviewed.  Refer to obstetrical flow sheet for FH & FHR.  Reports good fm.  Denies regular uc's, lof, vb, or uti s/s. No care since 18wks d/t transportation problems. Vulvar itching and white d/c x ~1wk. 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 He is here today and wants to be tested.  Spec exam: cx visually closed, thick clumpy white nonodorous d/c, wet prep: +yeast, rx diflucan Reviewed ptl s/s, fkc. Plan:  Continue routine obstetrical care  F/U in 1 wk (is only off on tues) for pn2 (no visit), then 4wks for OB appointment

## 2014-05-05 LAB — GC/CHLAMYDIA PROBE AMP
CT Probe RNA: NEGATIVE
GC Probe RNA: NEGATIVE

## 2014-05-11 ENCOUNTER — Other Ambulatory Visit: Payer: Medicaid Other

## 2014-05-11 ENCOUNTER — Telehealth: Payer: Self-pay | Admitting: Women's Health

## 2014-05-11 MED ORDER — FLUCONAZOLE 150 MG PO TABS: 150.0000 mg | ORAL_TABLET | Freq: Once | ORAL | Status: DC

## 2014-05-11 NOTE — Telephone Encounter (Signed)
LMOVM that RX was sent in to pharmacy.

## 2014-05-11 NOTE — Telephone Encounter (Signed)
Pt states she was here last Tuesday and had a yeast infection, she took Diflucan on Tuesday and then repeated it on Friday and is still having a white chunky dc but the itching and irritation has resolved.  Pt is requesting another Diflucan be sent to her pharmacy.

## 2014-05-18 ENCOUNTER — Other Ambulatory Visit: Payer: Medicaid Other

## 2014-05-18 ENCOUNTER — Ambulatory Visit (INDEPENDENT_AMBULATORY_CARE_PROVIDER_SITE_OTHER): Payer: Medicaid Other | Admitting: Advanced Practice Midwife

## 2014-05-18 ENCOUNTER — Encounter: Payer: Self-pay | Admitting: Advanced Practice Midwife

## 2014-05-18 VITALS — BP 110/60 | Wt 164.0 lb

## 2014-05-18 DIAGNOSIS — Z114 Encounter for screening for human immunodeficiency virus [HIV]: Secondary | ICD-10-CM

## 2014-05-18 DIAGNOSIS — Z331 Pregnant state, incidental: Secondary | ICD-10-CM

## 2014-05-18 DIAGNOSIS — Z3483 Encounter for supervision of other normal pregnancy, third trimester: Secondary | ICD-10-CM

## 2014-05-18 DIAGNOSIS — B379 Candidiasis, unspecified: Secondary | ICD-10-CM

## 2014-05-18 DIAGNOSIS — Z113 Encounter for screening for infections with a predominantly sexual mode of transmission: Secondary | ICD-10-CM

## 2014-05-18 DIAGNOSIS — Z131 Encounter for screening for diabetes mellitus: Secondary | ICD-10-CM

## 2014-05-18 DIAGNOSIS — Z1389 Encounter for screening for other disorder: Secondary | ICD-10-CM

## 2014-05-18 DIAGNOSIS — Z0184 Encounter for antibody response examination: Secondary | ICD-10-CM

## 2014-05-18 LAB — CBC
HEMATOCRIT: 31.4 % — AB (ref 36.0–46.0)
Hemoglobin: 10.4 g/dL — ABNORMAL LOW (ref 12.0–15.0)
MCH: 24.9 pg — AB (ref 26.0–34.0)
MCHC: 33.1 g/dL (ref 30.0–36.0)
MCV: 75.1 fL — AB (ref 78.0–100.0)
MPV: 9.5 fL (ref 9.4–12.4)
PLATELETS: 226 10*3/uL (ref 150–400)
RBC: 4.18 MIL/uL (ref 3.87–5.11)
RDW: 14.7 % (ref 11.5–15.5)
WBC: 7.6 10*3/uL (ref 4.0–10.5)

## 2014-05-18 MED ORDER — FLUCONAZOLE 150 MG PO TABS: ORAL_TABLET | ORAL | Status: DC

## 2014-05-18 NOTE — Progress Notes (Signed)
[facility]  Patient name: Erica [name] MRN [id number]  Date of birth: 1995-09-30  CC & HPI:  Erica [name] is a 18 y.o. African American female presenting today for WORK IN d/t persistant yeast infection.  Dx last month, sx subsided and returned a week ago.  Took diflucan again, sx subsised some but not completely.  Taking gtt today.  Pertinent History Reviewed:  Medical & Surgical Hx:   Past Medical History  Diagnosis Date  . Ganglion cyst of wrist     left  . Yeast infection 04/07/2013   Past Surgical History  Procedure Laterality Date  . No past surgeries     Current outpatient prescriptions: acetaminophen (TYLENOL) 500 MG tablet, Take 1,000 mg by mouth daily as needed., Disp: , Rfl: ;  azithromycin (ZITHROMAX) 250 MG tablet, Take 1 tablet (250 mg total) by mouth daily. Take 2 today, the 1 a day for 4 days (Patient not taking: Reported on 05/04/2014), Disp: 6 tablet, Rfl: 0;  fluconazole (DIFLUCAN) 150 MG tablet, Take 1 tablet (150 mg total) by mouth once., Disp: 1 tablet, Rfl: 0 Pediatric Multiple Vit-C-FA (FLINSTONES GUMMIES OMEGA-3 DHA PO), Take by mouth. Take 2 daily., Disp: , Rfl: ;  Prenat-FeFum-FePo-FA-Omega 3 (CONCEPT DHA) 53.5-38-1 MG CAPS, Take 1 capsule by mouth daily., Disp: 30 capsule, Rfl: 11 Social History: Reviewed -  reports that she has never smoked. She has never used smokeless tobacco.  Objective Findings:  Vitals: LMP 09/25/2013  Physical Examination: General appearance - alert, well appearing, and in no distress Mental status - alert, oriented to person, place, and time Declined pelvic s/t sx c/w previously dx yeast (wet prep)  No results found for this or any previous visit (from the past 24 hour(s)).   Assessment & Plan:  A:    P:  Diflucan 150mg  qd x 5 days.  If sx still persistant, consider culture   F/U as scheduled for OBV   CRESENZO-DISHMAN,Dennis Killilea CNM 05/18/2014 10:22 AM

## 2014-05-18 NOTE — Progress Notes (Signed)
Pt here today for recurrent yeast infection. Pt states that she has been treated twice for it and it almost goes away but she still has the discharge. Pt states that she has really bad back pain as well.

## 2014-05-19 LAB — GLUCOSE TOLERANCE, 2 HOURS W/ 1HR
GLUCOSE: 129 mg/dL (ref 70–170)
Glucose, 2 hour: 78 mg/dL (ref 70–139)
Glucose, Fasting: 85 mg/dL (ref 70–99)

## 2014-05-19 LAB — RPR

## 2014-05-19 LAB — HIV ANTIBODY (ROUTINE TESTING W REFLEX): HIV 1&2 Ab, 4th Generation: NONREACTIVE

## 2014-05-19 LAB — ANTIBODY SCREEN: ANTIBODY SCREEN: NEGATIVE

## 2014-05-20 LAB — HSV 2 ANTIBODY, IGG: HSV 2 Glycoprotein G Ab, IgG: <0.1 IV

## 2014-05-26 NOTE — Telephone Encounter (Signed)
Opened in errer

## 2014-05-28 NOTE — L&D Delivery Note (Signed)
Delivery Note At 10:23 PM a viable female was delivered via Vaginal, Spontaneous Delivery (Presentation: Left Occiput Anterior).  APGAR: 9, ; weight pending.   Placenta status: delivered intact, .  Cord: 3 vessel, no nuchal with the following complications: None.  Cord pH: n/a  Anesthesia: Epidural  Episiotomy:  none Lacerations:  none Suture Repair: n/a Est. Blood Loss (mL): 400mL   Healthy female delivered over intact perineum. Minimal pushing required. Immediate cry, vigorous infant placed on mom's chest.   Mom to postpartum.  Baby to Couplet care / Skin to Skin.  Erica [name], Erica [name] 07/16/2014, 10:50 PM

## 2014-06-01 ENCOUNTER — Encounter: Payer: Self-pay | Admitting: Women's Health

## 2014-06-01 ENCOUNTER — Ambulatory Visit (INDEPENDENT_AMBULATORY_CARE_PROVIDER_SITE_OTHER): Payer: Medicaid Other | Admitting: Women's Health

## 2014-06-01 VITALS — BP 114/48 | Wt 165.0 lb

## 2014-06-01 DIAGNOSIS — Z3483 Encounter for supervision of other normal pregnancy, third trimester: Secondary | ICD-10-CM

## 2014-06-01 DIAGNOSIS — O26893 Other specified pregnancy related conditions, third trimester: Secondary | ICD-10-CM

## 2014-06-01 DIAGNOSIS — N898 Other specified noninflammatory disorders of vagina: Secondary | ICD-10-CM

## 2014-06-01 DIAGNOSIS — Z1389 Encounter for screening for other disorder: Secondary | ICD-10-CM

## 2014-06-01 DIAGNOSIS — Z331 Pregnant state, incidental: Secondary | ICD-10-CM

## 2014-06-01 LAB — POCT URINALYSIS DIPSTICK
Blood, UA: NEGATIVE
GLUCOSE UA: NEGATIVE
Glucose, UA: NEGATIVE
Ketones, UA: NEGATIVE
Leukocytes, UA: NEGATIVE
Nitrite, UA: NEGATIVE
PROTEIN UA: NEGATIVE
Protein, UA: NEGATIVE

## 2014-06-01 LAB — POCT WET PREP (WET MOUNT): Clue Cells Wet Prep Whiff POC: NEGATIVE

## 2014-06-01 MED ORDER — FUSION PLUS PO CAPS: 1.0000 | ORAL_CAPSULE | ORAL | Status: DC

## 2014-06-01 NOTE — Patient Instructions (Signed)
Call the office ([phone]) or go to [facility] if:  You begin to have strong, frequent contractions  Your water breaks.  Sometimes it is a big gush of fluid, sometimes it is just a trickle that keeps getting your panties wet or running down your legs  You have vaginal bleeding.  It is normal to have a small amount of spotting if your cervix was checked.   You don't feel your baby moving like normal.  If you don't, get you something to eat and drink and lay down and focus on feeling your baby move.  You should feel at least 10 movements in 2 hours.  If you don't, you should call the office or go to [facility].    Tdap Vaccine  It is recommended that you get the Tdap vaccine during the third trimester of EACH pregnancy to help protect your baby from getting pertussis (whooping cough)  27-36 weeks is the BEST time to do this so that you can pass the protection on to your baby. During pregnancy is better than after pregnancy, but if you are unable to get it during pregnancy it will be offered at the hospital.   You can get this vaccine at the health department or your family doctor  Everyone who will be around your baby should also be up-to-date on their vaccines. Adults (who are not pregnant) only need 1 dose of Tdap during adulthood.     Third Trimester of Pregnancy The third trimester is from week 29 through week 42, months 7 through 9. The third trimester is a time when the fetus is growing rapidly. At the end of the ninth month, the fetus is about 20 inches in length and weighs 6-10 pounds.  BODY CHANGES Your body goes through many changes during pregnancy. The changes vary from woman to woman.  8. Your weight will continue to increase. You can expect to gain 25-35 pounds (11-16 kg) by the end of the pregnancy. 9. You may begin to get stretch marks on your hips, abdomen, and breasts. 10. You may urinate more often because the fetus is moving lower into your pelvis and pressing  on your bladder. 11. You may develop or continue to have heartburn as a result of your pregnancy. 12. You may develop constipation because certain hormones are causing the muscles that push waste through your intestines to slow down. 13. You may develop hemorrhoids or swollen, bulging veins (varicose veins). 14. You may have pelvic pain because of the weight gain and pregnancy hormones relaxing your joints between the bones in your pelvis. Backaches may result from overexertion of the muscles supporting your posture. 15. You may have changes in your hair. These can include thickening of your hair, rapid growth, and changes in texture. Some women also have hair loss during or after pregnancy, or hair that feels dry or thin. Your hair will most likely return to normal after your baby is born. 16. Your breasts will continue to grow and be tender. A yellow discharge may leak from your breasts called colostrum. 17. Your belly button may stick out. 18. You may feel short of breath because of your expanding uterus. 19. You may notice the fetus "dropping," or moving lower in your abdomen. 20. You may have a bloody mucus discharge. This usually occurs a few days to a week before labor begins. 21. Your cervix becomes thin and soft (effaced) near your due date. WHAT TO EXPECT AT YOUR PRENATAL EXAMS  You will   have prenatal exams every 2 weeks until week 36. Then, you will have weekly prenatal exams. During a routine prenatal visit: 2. You will be weighed to make sure you and the fetus are growing normally. 3. Your blood pressure is taken. 4. Your abdomen will be measured to track your baby's growth. 5. The fetal heartbeat will be listened to. 6. Any test results from the previous visit will be discussed. 7. You may have a cervical check near your due date to see if you have effaced. At around 36 weeks, your caregiver will check your cervix. At the same time, your caregiver will also perform a test on the  secretions of the vaginal tissue. This test is to determine if a type of bacteria, Group B streptococcus, is present. Your caregiver will explain this further. Your caregiver may ask you:  What your birth plan is.  How you are feeling.  If you are feeling the baby move.  If you have had any abnormal symptoms, such as leaking fluid, bleeding, severe headaches, or abdominal cramping.  If you have any questions. Other tests or screenings that may be performed during your third trimester include:  Blood tests that check for low iron levels (anemia).  Fetal testing to check the health, activity level, and growth of the fetus. Testing is done if you have certain medical conditions or if there are problems during the pregnancy. FALSE LABOR You may feel small, irregular contractions that eventually go away. These are called Braxton Hicks contractions, or false labor. Contractions may last for hours, days, or even weeks before true labor sets in. If contractions come at regular intervals, intensify, or become painful, it is best to be seen by your caregiver.  SIGNS OF LABOR   Menstrual-like cramps.  Contractions that are 5 minutes apart or less.  Contractions that start on the top of the uterus and spread down to the lower abdomen and back.  A sense of increased pelvic pressure or back pain.  A watery or bloody mucus discharge that comes from the vagina. If you have any of these signs before the 37th week of pregnancy, call your caregiver right away. You need to go to the hospital to get checked immediately. HOME CARE INSTRUCTIONS   Avoid all smoking, herbs, alcohol, and unprescribed drugs. These chemicals affect the formation and growth of the baby.  Follow your caregiver's instructions regarding medicine use. There are medicines that are either safe or unsafe to take during pregnancy.  Exercise only as directed by your caregiver. Experiencing uterine cramps is a good sign to stop  exercising.  Continue to eat regular, healthy meals.  Wear a good support bra for breast tenderness.  Do not use hot tubs, steam rooms, or saunas.  Wear your seat belt at all times when driving.  Avoid raw meat, uncooked cheese, cat litter boxes, and soil used by cats. These carry germs that can cause birth defects in the baby.  Take your prenatal vitamins.  Try taking a stool softener (if your caregiver approves) if you develop constipation. Eat more high-fiber foods, such as fresh vegetables or fruit and whole grains. Drink plenty of fluids to keep your urine clear or pale yellow.  Take warm sitz baths to soothe any pain or discomfort caused by hemorrhoids. Use hemorrhoid cream if your caregiver approves.  If you develop varicose veins, wear support hose. Elevate your feet for 15 minutes, 3-4 times a day. Limit salt in your diet.  Avoid heavy lifting, wear   low heal shoes, and practice good posture.  Rest a lot with your legs elevated if you have leg cramps or low back pain.  Visit your dentist if you have not gone during your pregnancy. Use a soft toothbrush to brush your teeth and be gentle when you floss.  A sexual relationship may be continued unless your caregiver directs you otherwise.  Do not travel far distances unless it is absolutely necessary and only with the approval of your caregiver.  Take prenatal classes to understand, practice, and ask questions about the labor and delivery.  Make a trial run to the hospital.  Pack your hospital bag.  Prepare the baby's nursery.  Continue to go to all your prenatal visits as directed by your caregiver. SEEK MEDICAL CARE IF:  You are unsure if you are in labor or if your water has broken.  You have dizziness.  You have mild pelvic cramps, pelvic pressure, or nagging pain in your abdominal area.  You have persistent nausea, vomiting, or diarrhea.  You have a bad smelling vaginal discharge.  You have pain with  urination. SEEK IMMEDIATE MEDICAL CARE IF:   You have a fever.  You are leaking fluid from your vagina.  You have spotting or bleeding from your vagina.  You have severe abdominal cramping or pain.  You have rapid weight loss or gain.  You have shortness of breath with chest pain.  You notice sudden or extreme swelling of your face, hands, ankles, feet, or legs.  You have not felt your baby move in over an hour.  You have severe headaches that do not go away with medicine.  You have vision changes. Document Released: 05/08/2001 Document Revised: 05/19/2013 Document Reviewed: 07/15/2012 [employer] Patient Information 2015 [employer]. This information is not intended to replace advice given to you by your health care provider. Make sure you discuss any questions you have with your health care provider.  

## 2014-06-01 NOTE — Progress Notes (Signed)
Low-risk OB appointment [id number] [id number] Estimated Date of Delivery: 07/27/14 BP 114/48 mmHg  Wt 165 lb (74.844 kg)  LMP 09/25/2013  BP, weight, and urine reviewed.  Refer to obstetrical flow sheet for FH & FHR.  Reports good fm.  Denies regular uc's, lof, vb, or uti s/s. Still has d/c, not thick or clumpy anymore, no itching/irritation, has been treated multiple times for yeast.  Spec exam: cx visually closed, scant amount creamy white nonodorous d/c Wet prep: neg Reviewed pn2 results, rx fusion plus for mild anemia- increase fe-rich foods.  Discussed ptl s/s, fkc.  Plan:  Continue routine obstetrical care  F/U in 2wks for OB appointment

## 2014-06-03 ENCOUNTER — Telehealth: Payer: Self-pay | Admitting: *Deleted

## 2014-06-03 NOTE — Telephone Encounter (Signed)
Pt requesting letter for work stating her due date and recovery time. Also, requesting a note stating pt needs frequent restroom breaks. Pt informed letters done and at front for pick up.

## 2014-06-13 IMAGING — US US RENAL
1 series · 14 of 25 positions shown · non-contrast
Comparison: None.

CLINICAL DATA: Abdominal pain

EXAM:
RENAL/URINARY TRACT ULTRASOUND COMPLETE

[Series 1: us renal · 0.20mm/px · 14 of 26 slices shown]
[im 1/26]
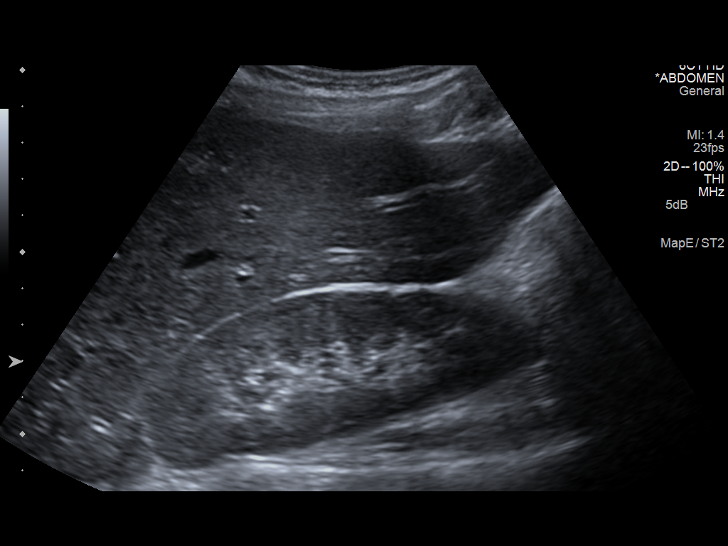
[im 3/26]
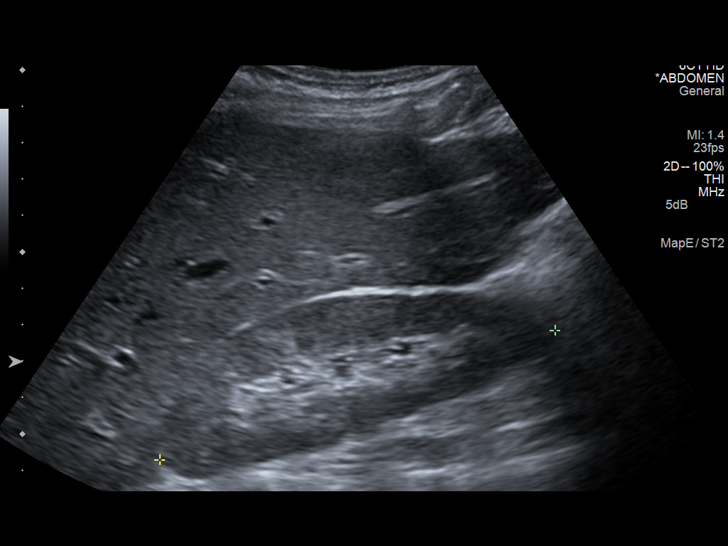
[im 5/26]
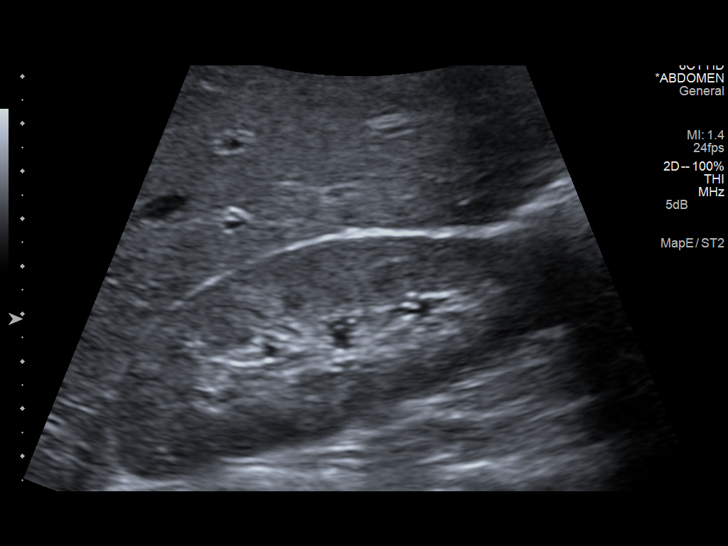
[im 7/26]
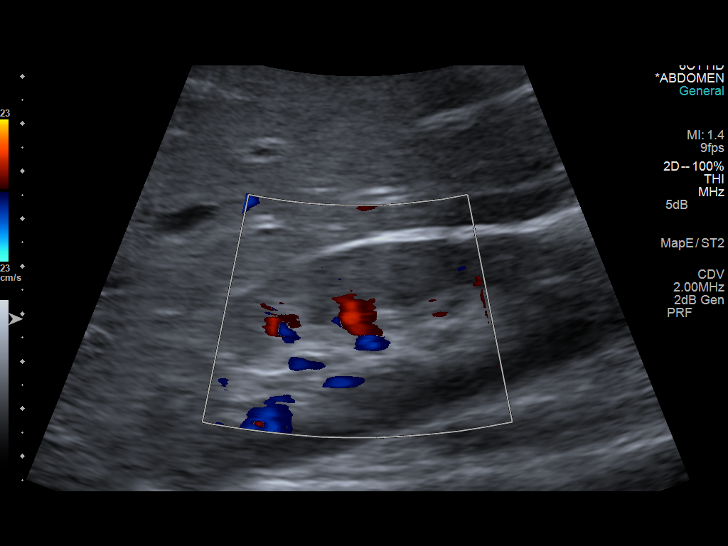
[im 9/26]
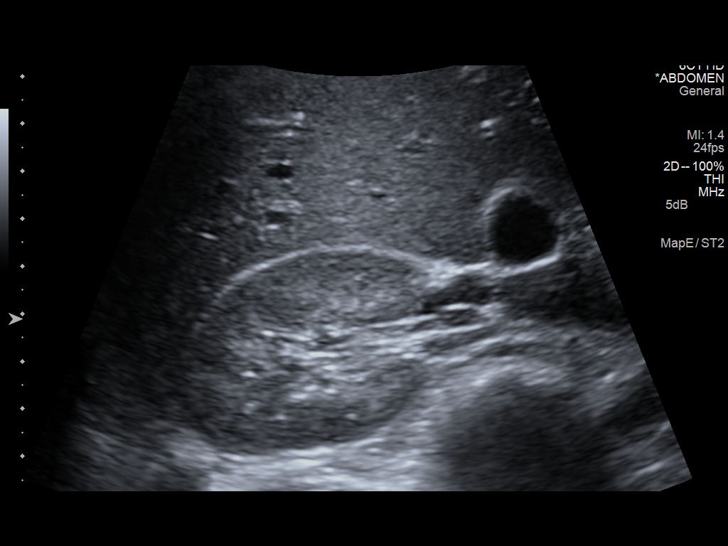
[im 10/26]
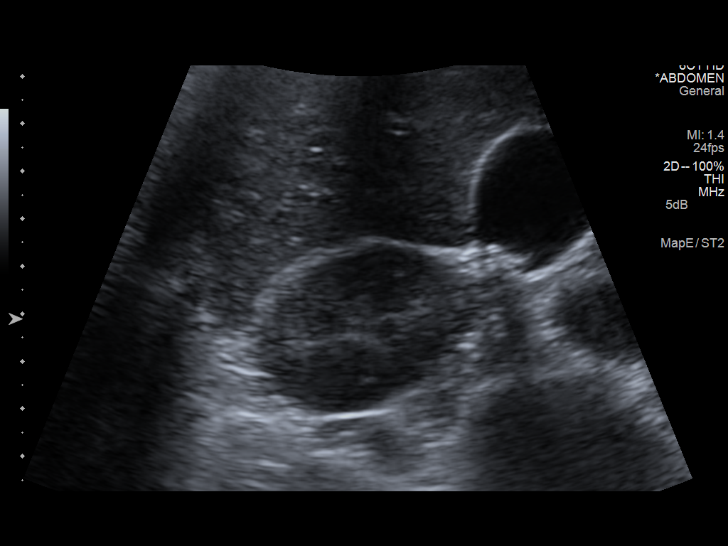
[im 12/26]
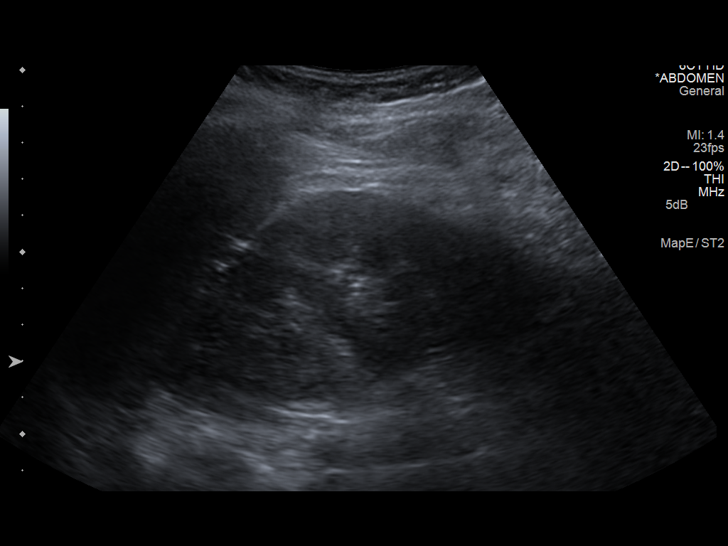
[im 14/26]
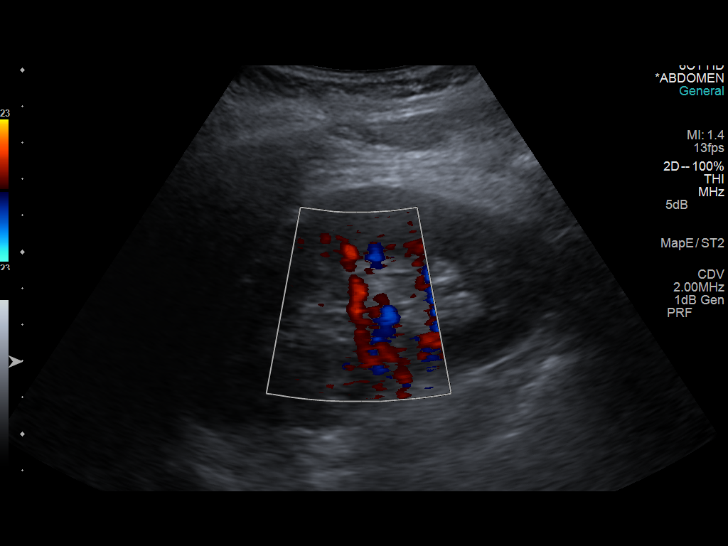
[im 16/26]
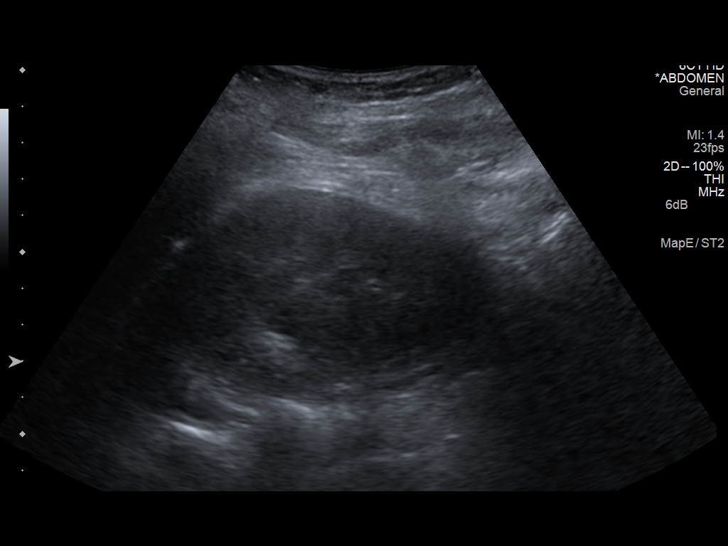
[im 17/26]
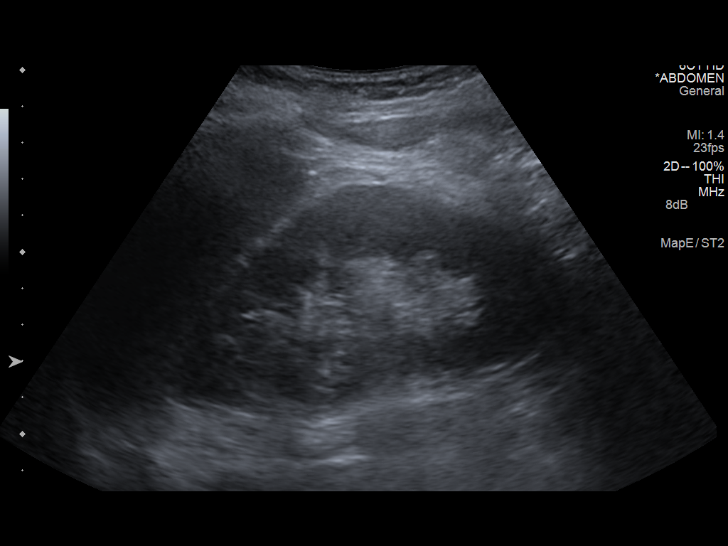
[im 19/26]
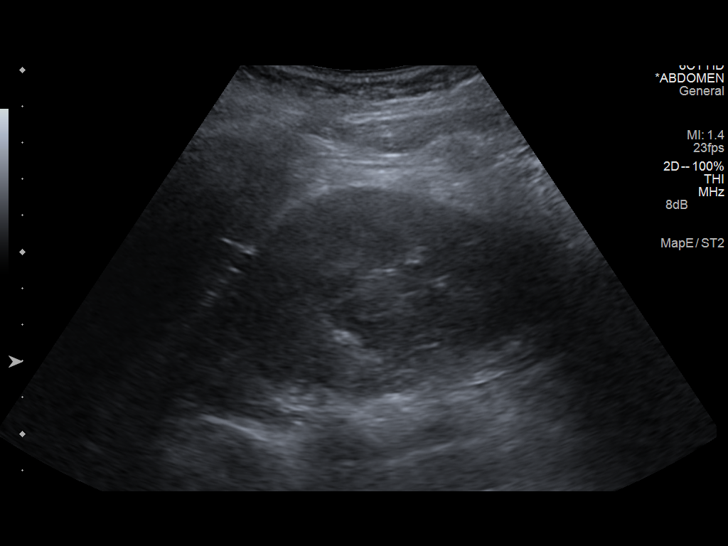
[im 21/26]
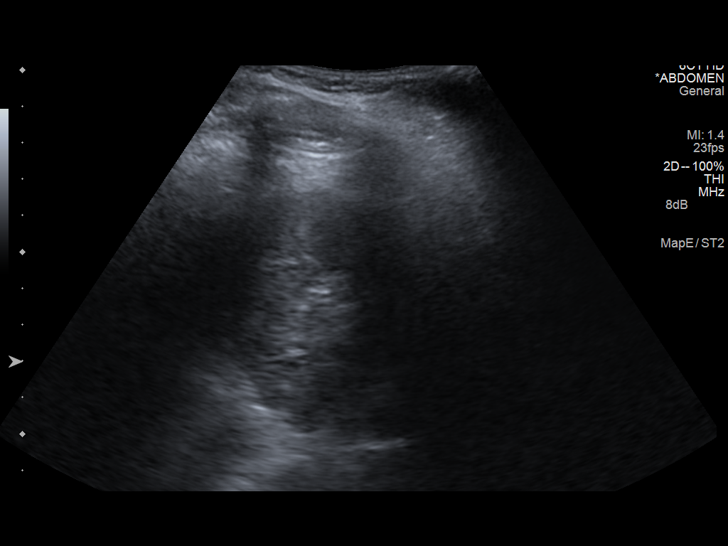
[im 23/26]
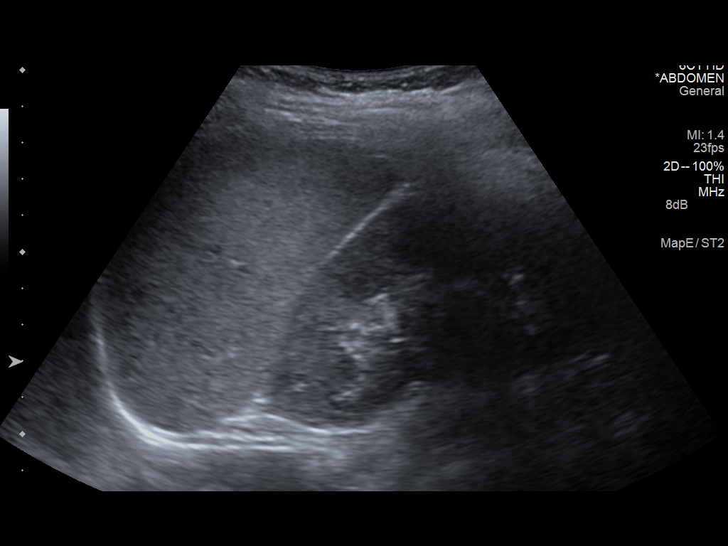
[im 26/26]
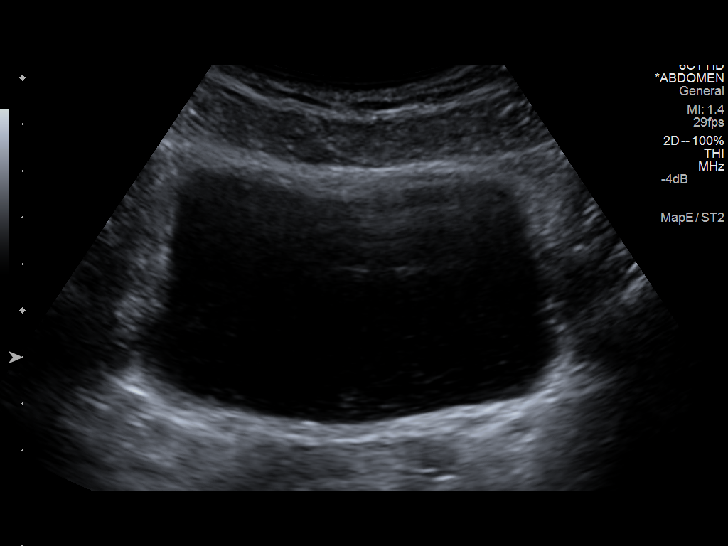

[14 of 25 positions shown; findings below may reference images not displayed]

FINDINGS: Right Kidney:

Length: 11.4 cm. Echogenicity within normal limits. No mass or
hydronephrosis visualized.

Left Kidney:

Length: 11.3 cm. Echogenicity within normal limits. No mass or
hydronephrosis visualized.

Bladder:

Appears normal for degree of bladder distention.
IMPRESSION: Normal renal ultrasound.

## 2014-06-15 ENCOUNTER — Encounter: Payer: Self-pay | Admitting: Women's Health

## 2014-06-15 ENCOUNTER — Ambulatory Visit (INDEPENDENT_AMBULATORY_CARE_PROVIDER_SITE_OTHER): Payer: Medicaid Other | Admitting: Women's Health

## 2014-06-15 VITALS — BP 120/80 | Wt 168.0 lb

## 2014-06-15 DIAGNOSIS — Z331 Pregnant state, incidental: Secondary | ICD-10-CM

## 2014-06-15 DIAGNOSIS — Z1389 Encounter for screening for other disorder: Secondary | ICD-10-CM

## 2014-06-15 DIAGNOSIS — Z3483 Encounter for supervision of other normal pregnancy, third trimester: Secondary | ICD-10-CM

## 2014-06-15 LAB — POCT URINALYSIS DIPSTICK
Blood, UA: NEGATIVE
Glucose, UA: NEGATIVE
Ketones, UA: NEGATIVE
Nitrite, UA: NEGATIVE

## 2014-06-15 MED ORDER — FLUCONAZOLE 150 MG PO TABS: ORAL_TABLET | ORAL | Status: DC

## 2014-06-15 NOTE — Patient Instructions (Addendum)
Call the office ([phone]) or go to [facility] if:  You begin to have strong, frequent contractions  Your water breaks.  Sometimes it is a big gush of fluid, sometimes it is just a trickle that keeps getting your panties wet or running down your legs  You have vaginal bleeding.  It is normal to have a small amount of spotting if your cervix was checked.   You don't feel your baby moving like normal.  If you don't, get you something to eat and drink and lay down and focus on feeling your baby move.  You should feel at least 10 movements in 2 hours.  If you don't, you should call the office or go to [facility].    [place] Pediatricians:  [facility] [phone]            [facility] [phone]                 [facility] [phone] (usually doesn't accept new patients unless you have family there already, you are always welcome to call and ask)             [facility] ([address] [place]) [phone]   [place] Pediatricians:   [facility]: [phone]  [facility]: [phone]   Preterm Labor Information Preterm labor is when labor starts at less than 37 weeks of pregnancy. The normal length of a pregnancy is 39 to 41 weeks. CAUSES Often, there is no identifiable underlying cause as to why a woman goes into preterm labor. One of the most common known causes of preterm labor is infection. Infections of the uterus, cervix, vagina, amniotic sac, bladder, kidney, or even the lungs (pneumonia) can cause labor to start. Other suspected causes of preterm labor include:  6. Urogenital infections, such as yeast infections and bacterial vaginosis.  7. Uterine abnormalities (uterine shape, uterine septum, fibroids, or bleeding from the placenta).  8. A cervix that has been operated on (it may fail to stay closed).  9. Malformations in the fetus.  10. Multiple  gestations (twins, triplets, and so on).  11. Breakage of the amniotic sac.  RISK FACTORS 2. Having a previous history of preterm labor.  3. Having premature rupture of membranes (PROM).  4. Having a placenta that covers the opening of the cervix (placenta previa).  5. Having a placenta that separates from the uterus (placental abruption).  6. Having a cervix that is too weak to hold the fetus in the uterus (incompetent cervix).  7. Having too much fluid in the amniotic sac (polyhydramnios).  8. Taking illegal drugs or smoking while pregnant.  9. Not gaining enough weight while pregnant.  10. Being younger than 18 and older than 21 years old.  11. Having a low socioeconomic status.  12. Being African American. SYMPTOMS Signs and symptoms of preterm labor include:  2. Menstrual-like cramps, abdominal pain, or back pain. 3. Uterine contractions that are regular, as frequent as six in an hour, regardless of their intensity (may be mild or painful). 4. Contractions that start on the top of the uterus and spread down to the lower abdomen and back.  5. A sense of increased pelvic pressure.  6. A watery or bloody mucus discharge that comes from the vagina.  TREATMENT Depending on the length of the pregnancy and other circumstances, your health care provider may suggest bed rest. If necessary, there are medicines that can be given to stop   contractions and to mature the fetal lungs. If labor happens before 34 weeks of pregnancy, a prolonged hospital stay may be recommended. Treatment depends on the condition of both you and the fetus.  WHAT SHOULD YOU DO IF YOU THINK YOU ARE IN PRETERM LABOR? Call your health care provider right away. You will need to go to the hospital to get checked immediately. HOW CAN YOU PREVENT PRETERM LABOR IN FUTURE PREGNANCIES? You should:  2. Stop smoking if you smoke. 3. Maintain healthy weight gain and avoid chemicals and drugs that are not  necessary. 4. Be watchful for any type of infection. 5. Inform your health care provider if you have a known history of preterm labor. Document Released: 08/04/2003 Document Revised: 01/14/2013 Document Reviewed: 06/16/2012 ExitCare Patient Information 2015 [employer]. This information is not intended to replace advice given to you by your health care provider. Make sure you discuss any questions you have with your health care provider.  

## 2014-06-15 NOTE — Progress Notes (Signed)
Low-risk OB appointment G2P1001 7547w0d Estimated Date of Delivery: 07/27/14 BP 120/80 mmHg  Wt 168 lb (76.204 kg)  LMP 09/25/2013  BP, weight, and urine reviewed.  Refer to obstetrical flow sheet for FH & FHR.  Reports good fm.  Denies regular uc's, lof, vb, or uti s/s. Yeast infection back, itching started last night. Rx diflucan.  Reviewed ptl s/s, fkc. Plan:  Continue routine obstetrical care  F/U in 2wks for OB appointment

## 2014-06-17 ENCOUNTER — Telehealth: Payer: Self-pay | Admitting: *Deleted

## 2014-06-17 NOTE — Telephone Encounter (Signed)
Dwana CurdVera, Triage Nurse with [facility], called stating pt had called with c/o irregular contractions but call got disconnected. Left message for pt to call our office back.

## 2014-06-17 NOTE — Telephone Encounter (Signed)
Pt states at 5:30 am sharp pain, + FM, no vaginal bleeding, irregular contractions, lower back pain. Pt informed to push fluids, tylenol, rest. [facility]. Pt also informed of labor precautions, braxton hicks. Pt verbalized understanding.

## 2014-06-29 ENCOUNTER — Encounter: Payer: Self-pay | Admitting: Women's Health

## 2014-06-29 ENCOUNTER — Ambulatory Visit (INDEPENDENT_AMBULATORY_CARE_PROVIDER_SITE_OTHER): Payer: Medicaid Other | Admitting: Women's Health

## 2014-06-29 VITALS — BP 108/56 | Wt 172.0 lb

## 2014-06-29 DIAGNOSIS — Z3483 Encounter for supervision of other normal pregnancy, third trimester: Secondary | ICD-10-CM

## 2014-06-29 DIAGNOSIS — Z1389 Encounter for screening for other disorder: Secondary | ICD-10-CM

## 2014-06-29 DIAGNOSIS — Z331 Pregnant state, incidental: Secondary | ICD-10-CM

## 2014-06-29 LAB — POCT URINALYSIS DIPSTICK
Blood, UA: NEGATIVE
GLUCOSE UA: NEGATIVE
Ketones, UA: NEGATIVE
LEUKOCYTES UA: NEGATIVE
Nitrite, UA: NEGATIVE
PROTEIN UA: NEGATIVE

## 2014-06-29 NOTE — Progress Notes (Signed)
Low-risk OB appointment G2P1001 6946w0d Estimated Date of Delivery: 07/27/14 BP 108/56 mmHg  Wt 172 lb (78.019 kg)  LMP 09/25/2013  BP, weight, and urine reviewed.  Refer to obstetrical flow sheet for FH & FHR.  Reports good fm.  Denies regular uc's, lof, vb, or uti s/s. Had uc's last wed, requests SVE SVE: 3/70/-2, vtx Reviewed ptl s/s, fkc. Plan:  Continue routine obstetrical care  F/U in 1wk for OB appointment

## 2014-06-29 NOTE — Patient Instructions (Signed)
Call the office ([phone]) or go to [facility] if:  You begin to have strong, frequent contractions  Your water breaks.  Sometimes it is a big gush of fluid, sometimes it is just a trickle that keeps getting your panties wet or running down your legs  You have vaginal bleeding.  It is normal to have a small amount of spotting if your cervix was checked.   You don't feel your baby moving like normal.  If you don't, get you something to eat and drink and lay down and focus on feeling your baby move.  You should feel at least 10 movements in 2 hours.  If you don't, you should call the office or go to [facility].    [place] Pediatricians:  [facility] [phone]            [facility] [phone]                 [facility] [phone] (usually doesn't accept new patients unless you have family there already, you are always welcome to call and ask)             [facility] ([address] [place]) [phone]   [place] Pediatricians:   [facility]: [phone]  [facility]: [phone]   Preterm Labor Information Preterm labor is when labor starts at less than 37 weeks of pregnancy. The normal length of a pregnancy is 39 to 41 weeks. CAUSES Often, there is no identifiable underlying cause as to why a woman goes into preterm labor. One of the most common known causes of preterm labor is infection. Infections of the uterus, cervix, vagina, amniotic sac, bladder, kidney, or even the lungs (pneumonia) can cause labor to start. Other suspected causes of preterm labor include:  6. Urogenital infections, such as yeast infections and bacterial vaginosis.  7. Uterine abnormalities (uterine shape, uterine septum, fibroids, or bleeding from the placenta).  8. A cervix that has been operated on (it may fail to stay closed).  9. Malformations in the fetus.  10. Multiple  gestations (twins, triplets, and so on).  11. Breakage of the amniotic sac.  RISK FACTORS 2. Having a previous history of preterm labor.  3. Having premature rupture of membranes (PROM).  4. Having a placenta that covers the opening of the cervix (placenta previa).  5. Having a placenta that separates from the uterus (placental abruption).  6. Having a cervix that is too weak to hold the fetus in the uterus (incompetent cervix).  7. Having too much fluid in the amniotic sac (polyhydramnios).  8. Taking illegal drugs or smoking while pregnant.  9. Not gaining enough weight while pregnant.  10. Being younger than 18 and older than 21 years old.  11. Having a low socioeconomic status.  12. Being African American. SYMPTOMS Signs and symptoms of preterm labor include:  2. Menstrual-like cramps, abdominal pain, or back pain. 3. Uterine contractions that are regular, as frequent as six in an hour, regardless of their intensity (may be mild or painful). 4. Contractions that start on the top of the uterus and spread down to the lower abdomen and back.  5. A sense of increased pelvic pressure.  6. A watery or bloody mucus discharge that comes from the vagina.  TREATMENT Depending on the length of the pregnancy and other circumstances, your health care provider may suggest bed rest. If necessary, there are medicines that can be given to stop   contractions and to mature the fetal lungs. If labor happens before 34 weeks of pregnancy, a prolonged hospital stay may be recommended. Treatment depends on the condition of both you and the fetus.  WHAT SHOULD YOU DO IF YOU THINK YOU ARE IN PRETERM LABOR? Call your health care provider right away. You will need to go to the hospital to get checked immediately. HOW CAN YOU PREVENT PRETERM LABOR IN FUTURE PREGNANCIES? You should:  2. Stop smoking if you smoke. 3. Maintain healthy weight gain and avoid chemicals and drugs that are not  necessary. 4. Be watchful for any type of infection. 5. Inform your health care provider if you have a known history of preterm labor. Document Released: 08/04/2003 Document Revised: 01/14/2013 Document Reviewed: 06/16/2012 ExitCare Patient Information 2015 [employer]. This information is not intended to replace advice given to you by your health care provider. Make sure you discuss any questions you have with your health care provider.  

## 2014-07-05 ENCOUNTER — Encounter (HOSPITAL_COMMUNITY): Payer: Self-pay | Admitting: *Deleted

## 2014-07-05 ENCOUNTER — Inpatient Hospital Stay (HOSPITAL_COMMUNITY)
Admission: AD | Admit: 2014-07-05 | Discharge: 2014-07-06 | Disposition: A | Discharge status: Home / Self Care | Payer: Medicaid Other | Source: Ambulatory Visit | Attending: Obstetrics & Gynecology | Admitting: Obstetrics & Gynecology

## 2014-07-05 DIAGNOSIS — O9989 Other specified diseases and conditions complicating pregnancy, childbirth and the puerperium: Secondary | ICD-10-CM | POA: Insufficient documentation

## 2014-07-05 DIAGNOSIS — Z3A37 37 weeks gestation of pregnancy: Secondary | ICD-10-CM | POA: Insufficient documentation

## 2014-07-05 NOTE — MAU Note (Signed)
Pt c/o watery vaginal leaking one time  last night approx 0030.   No further leaking this morning or last night.  Denies vag bleeding and reports good fetal monitoring.

## 2014-07-06 ENCOUNTER — Encounter: Payer: Self-pay | Admitting: Advanced Practice Midwife

## 2014-07-06 ENCOUNTER — Ambulatory Visit (INDEPENDENT_AMBULATORY_CARE_PROVIDER_SITE_OTHER): Payer: Medicaid Other | Admitting: Advanced Practice Midwife

## 2014-07-06 VITALS — BP 110/78 | Wt 175.5 lb

## 2014-07-06 DIAGNOSIS — Z1159 Encounter for screening for other viral diseases: Secondary | ICD-10-CM

## 2014-07-06 DIAGNOSIS — Z331 Pregnant state, incidental: Secondary | ICD-10-CM

## 2014-07-06 DIAGNOSIS — Z1389 Encounter for screening for other disorder: Secondary | ICD-10-CM

## 2014-07-06 DIAGNOSIS — O9989 Other specified diseases and conditions complicating pregnancy, childbirth and the puerperium: Secondary | ICD-10-CM | POA: Diagnosis not present

## 2014-07-06 DIAGNOSIS — Z118 Encounter for screening for other infectious and parasitic diseases: Secondary | ICD-10-CM

## 2014-07-06 DIAGNOSIS — Z3685 Encounter for antenatal screening for Streptococcus B: Secondary | ICD-10-CM

## 2014-07-06 DIAGNOSIS — A749 Chlamydial infection, unspecified: Secondary | ICD-10-CM

## 2014-07-06 DIAGNOSIS — Z3483 Encounter for supervision of other normal pregnancy, third trimester: Secondary | ICD-10-CM

## 2014-07-06 DIAGNOSIS — Z3A37 37 weeks gestation of pregnancy: Secondary | ICD-10-CM | POA: Diagnosis not present

## 2014-07-06 HISTORY — DX: Chlamydial infection, unspecified: A74.9

## 2014-07-06 LAB — POCT URINALYSIS DIPSTICK
Glucose, UA: NEGATIVE
Ketones, UA: NEGATIVE
LEUKOCYTES UA: NEGATIVE
Nitrite, UA: NEGATIVE
Protein, UA: NEGATIVE
RBC UA: NEGATIVE

## 2014-07-06 LAB — OB RESULTS CONSOLE GC/CHLAMYDIA
CHLAMYDIA, DNA PROBE: POSITIVE
Gonorrhea: NEGATIVE

## 2014-07-06 LAB — OB RESULTS CONSOLE GBS: GBS: NEGATIVE

## 2014-07-06 NOTE — Progress Notes (Signed)
G2P1001 8760w0d Estimated Date of Delivery: 07/27/14  Last menstrual period 09/25/2013, not currently breastfeeding.   BP weight and urine results all reviewed and noted.  Please refer to the obstetrical flow sheet for the fundal height and fetal heart rate documentation:  Patient reports good fetal movement, denies any bleeding and no rupture of membranes symptoms or regular contractions. Patient is without complaints. All questions were answered.  Plan:  Continued routine obstetrical care, GBS today  Follow up in 1 weeks for OB appointment,

## 2014-07-06 NOTE — Progress Notes (Signed)
Pt denies any problems or concerns at this time.  

## 2014-07-10 LAB — CULTURE, BETA STREP (GROUP B ONLY): Strep Gp B Culture: NEGATIVE

## 2014-07-12 LAB — GC/CHLAMYDIA PROBE AMP
CHLAMYDIA, DNA PROBE: POSITIVE — AB
NEISSERIA GONORRHOEAE BY PCR: NEGATIVE

## 2014-07-13 ENCOUNTER — Encounter: Payer: Medicaid Other | Admitting: Obstetrics and Gynecology

## 2014-07-16 ENCOUNTER — Inpatient Hospital Stay (HOSPITAL_COMMUNITY)
Admission: AD | Admit: 2014-07-16 | Discharge: 2014-07-18 | DRG: 774 | Disposition: A | Discharge status: Home / Self Care | Payer: Medicaid Other | Source: Ambulatory Visit | Attending: Obstetrics and Gynecology | Admitting: Obstetrics and Gynecology

## 2014-07-16 ENCOUNTER — Inpatient Hospital Stay (HOSPITAL_COMMUNITY): Payer: Medicaid Other | Admitting: Anesthesiology

## 2014-07-16 ENCOUNTER — Encounter (HOSPITAL_COMMUNITY): Payer: Self-pay | Admitting: *Deleted

## 2014-07-16 DIAGNOSIS — A64 Unspecified sexually transmitted disease: Secondary | ICD-10-CM | POA: Diagnosis present

## 2014-07-16 DIAGNOSIS — O4290 Premature rupture of membranes, unspecified as to length of time between rupture and onset of labor, unspecified weeks of gestation: Secondary | ICD-10-CM | POA: Diagnosis present

## 2014-07-16 DIAGNOSIS — A568 Sexually transmitted chlamydial infection of other sites: Secondary | ICD-10-CM | POA: Diagnosis present

## 2014-07-16 DIAGNOSIS — O4292 Full-term premature rupture of membranes, unspecified as to length of time between rupture and onset of labor: Principal | ICD-10-CM | POA: Diagnosis present

## 2014-07-16 DIAGNOSIS — O9832 Other infections with a predominantly sexual mode of transmission complicating childbirth: Secondary | ICD-10-CM

## 2014-07-16 DIAGNOSIS — O09893 Supervision of other high risk pregnancies, third trimester: Secondary | ICD-10-CM

## 2014-07-16 DIAGNOSIS — O0930 Supervision of pregnancy with insufficient antenatal care, unspecified trimester: Secondary | ICD-10-CM

## 2014-07-16 DIAGNOSIS — Z3A38 38 weeks gestation of pregnancy: Secondary | ICD-10-CM | POA: Diagnosis present

## 2014-07-16 DIAGNOSIS — Z3483 Encounter for supervision of other normal pregnancy, third trimester: Secondary | ICD-10-CM

## 2014-07-16 DIAGNOSIS — Z833 Family history of diabetes mellitus: Secondary | ICD-10-CM

## 2014-07-16 HISTORY — DX: Anemia, unspecified: D64.9

## 2014-07-16 HISTORY — DX: Chlamydial infection, unspecified: A74.9

## 2014-07-16 LAB — TYPE AND SCREEN
ABO/RH(D): O POS
Antibody Screen: NEGATIVE

## 2014-07-16 LAB — POCT FERN TEST

## 2014-07-16 LAB — CBC
HEMATOCRIT: 30.2 % — AB (ref 36.0–46.0)
Hemoglobin: 9.8 g/dL — ABNORMAL LOW (ref 12.0–15.0)
MCH: 22.6 pg — AB (ref 26.0–34.0)
MCHC: 32.5 g/dL (ref 30.0–36.0)
MCV: 69.7 fL — AB (ref 78.0–100.0)
Platelets: 221 10*3/uL (ref 150–400)
RBC: 4.33 MIL/uL (ref 3.87–5.11)
RDW: 15.7 % — AB (ref 11.5–15.5)
WBC: 8 10*3/uL (ref 4.0–10.5)

## 2014-07-16 MED ORDER — TERBUTALINE SULFATE 1 MG/ML IJ SOLN: 0.2500 mg | Freq: Once | INTRAMUSCULAR | Status: AC | PRN

## 2014-07-16 MED ORDER — PHENYLEPHRINE 40 MCG/ML (10ML) SYRINGE FOR IV PUSH (FOR BLOOD PRESSURE SUPPORT)
80.0000 ug | PREFILLED_SYRINGE | INTRAVENOUS | Status: DC | PRN
Start: 2014-07-16 — End: 2014-07-17
  Filled 2014-07-16: qty 2
  Filled 2014-07-16: qty 20

## 2014-07-16 MED ORDER — LACTATED RINGERS IV SOLN
INTRAVENOUS | Status: DC
Start: 2014-07-16 — End: 2014-07-17
  Administered 2014-07-16: 125 mL/h via INTRAVENOUS
  Administered 2014-07-16 (×2): via INTRAVENOUS

## 2014-07-16 MED ORDER — IBUPROFEN 600 MG PO TABS
600.0000 mg | ORAL_TABLET | Freq: Four times a day (QID) | ORAL | Status: DC
  Administered 2014-07-17 – 2014-07-18 (×7): 600 mg via ORAL
  Filled 2014-07-16 (×7): qty 1

## 2014-07-16 MED ORDER — PHENYLEPHRINE 40 MCG/ML (10ML) SYRINGE FOR IV PUSH (FOR BLOOD PRESSURE SUPPORT)
80.0000 ug | PREFILLED_SYRINGE | INTRAVENOUS | Status: DC | PRN
Start: 2014-07-16 — End: 2014-07-17
  Filled 2014-07-16: qty 2

## 2014-07-16 MED ORDER — OXYTOCIN BOLUS FROM INFUSION
500.0000 mL | INTRAVENOUS | Status: DC
  Administered 2014-07-16: 500 mL via INTRAVENOUS

## 2014-07-16 MED ORDER — LIDOCAINE HCL (PF) 1 % IJ SOLN
30.0000 mL | INTRAMUSCULAR | Status: DC | PRN
  Filled 2014-07-16: qty 30

## 2014-07-16 MED ORDER — FENTANYL 2.5 MCG/ML BUPIVACAINE 1/10 % EPIDURAL INFUSION (WH - ANES)
14.0000 mL/h | INTRAMUSCULAR | Status: DC | PRN
  Administered 2014-07-16 (×2): 14 mL/h via EPIDURAL
  Filled 2014-07-16 (×2): qty 125

## 2014-07-16 MED ORDER — EPHEDRINE 5 MG/ML INJ
10.0000 mg | INTRAVENOUS | Status: DC | PRN
  Filled 2014-07-16: qty 2

## 2014-07-16 MED ORDER — DIPHENHYDRAMINE HCL 50 MG/ML IJ SOLN: 12.5000 mg | INTRAMUSCULAR | Status: DC | PRN

## 2014-07-16 MED ORDER — OXYTOCIN 40 UNITS IN LACTATED RINGERS INFUSION - SIMPLE MED
1.0000 m[IU]/min | INTRAVENOUS | Status: DC
  Administered 2014-07-16: 2 m[IU]/min via INTRAVENOUS
  Administered 2014-07-16: 4 m[IU]/min via INTRAVENOUS
  Filled 2014-07-16: qty 1000

## 2014-07-16 MED ORDER — LACTATED RINGERS IV SOLN
500.0000 mL | INTRAVENOUS | Status: DC | PRN
  Administered 2014-07-16 (×2): 500 mL via INTRAVENOUS

## 2014-07-16 MED ORDER — OXYCODONE-ACETAMINOPHEN 5-325 MG PO TABS: 1.0000 | ORAL_TABLET | ORAL | Status: DC | PRN

## 2014-07-16 MED ORDER — LACTATED RINGERS IV SOLN
500.0000 mL | Freq: Once | INTRAVENOUS | Status: AC
  Administered 2014-07-16: 500 mL via INTRAVENOUS

## 2014-07-16 MED ORDER — ONDANSETRON HCL 4 MG/2ML IJ SOLN: 4.0000 mg | Freq: Four times a day (QID) | INTRAMUSCULAR | Status: DC | PRN

## 2014-07-16 MED ORDER — ACETAMINOPHEN 325 MG PO TABS: 650.0000 mg | ORAL_TABLET | ORAL | Status: DC | PRN

## 2014-07-16 MED ORDER — CITRIC ACID-SODIUM CITRATE 334-500 MG/5ML PO SOLN: 30.0000 mL | ORAL | Status: DC | PRN

## 2014-07-16 MED ORDER — FENTANYL 2.5 MCG/ML BUPIVACAINE 1/10 % EPIDURAL INFUSION (WH - ANES)
INTRAMUSCULAR | Status: DC | PRN
  Administered 2014-07-16: 14 mL/h via EPIDURAL

## 2014-07-16 MED ORDER — OXYTOCIN 40 UNITS IN LACTATED RINGERS INFUSION - SIMPLE MED
62.5000 mL/h | INTRAVENOUS | Status: DC
  Administered 2014-07-16: 62.5 mL/h via INTRAVENOUS

## 2014-07-16 MED ORDER — OXYCODONE-ACETAMINOPHEN 5-325 MG PO TABS: 2.0000 | ORAL_TABLET | ORAL | Status: DC | PRN

## 2014-07-16 MED ORDER — AZITHROMYCIN 250 MG PO TABS
1000.0000 mg | ORAL_TABLET | Freq: Once | ORAL | Status: AC
  Administered 2014-07-16: 1000 mg via ORAL
  Filled 2014-07-16: qty 4

## 2014-07-16 MED ORDER — LIDOCAINE HCL (PF) 1 % IJ SOLN
INTRAMUSCULAR | Status: DC | PRN
  Administered 2014-07-16 (×2): 8 mL

## 2014-07-16 NOTE — MAU Note (Signed)
C/o SROM [id number];

## 2014-07-16 NOTE — Anesthesia Preprocedure Evaluation (Signed)
Anesthesia Evaluation  Patient identified by MRN, date of birth, ID band Patient awake    Reviewed: Allergy & Precautions, H&P , Patient's Chart, lab work & pertinent test results  Airway Mallampati: I  TM Distance: >3 FB Neck ROM: full    Dental no notable dental hx.    Pulmonary neg pulmonary ROS,    Pulmonary exam normal       Cardiovascular negative cardio ROS      Neuro/Psych negative neurological ROS  negative psych ROS   GI/Hepatic negative GI ROS, Neg liver ROS,   Endo/Other  negative endocrine ROS  Renal/GU negative Renal ROS     Musculoskeletal   Abdominal Normal abdominal exam  (+)   Peds  Hematology   Anesthesia Other Findings   Reproductive/Obstetrics (+) Pregnancy                             Anesthesia Physical Anesthesia Plan  ASA: II  Anesthesia Plan: Epidural   Post-op Pain Management:    Induction:   Airway Management Planned:   Additional Equipment:   Intra-op Plan:   Post-operative Plan:   Informed Consent: I have reviewed the patients History and Physical, chart, labs and discussed the procedure including the risks, benefits and alternatives for the proposed anesthesia with the patient or authorized representative who has indicated his/her understanding and acceptance.     Plan Discussed with:   Anesthesia Plan Comments:         Anesthesia Quick Evaluation

## 2014-07-16 NOTE — Progress Notes (Signed)
Erica PersonDanielle [name] is a 19 y.o. G2P1001 at 1665w3d admitted for PROM  Subjective: Comfortable, not feeling any contractions.  Objective: BP 97/63 mmHg  Pulse 122  Temp(Src) 98.6 F (37 C) (Oral)  Resp 18  Ht 5\' 3"  (1.6 m)  Wt 80.287 kg (177 lb)  BMI 31.36 kg/m2  LMP 09/25/2013     FHT:  FHR: 145 bpm, variability: moderate,  accelerations:  Present,  decelerations:  Absent UC:   Occasional, mild  SVE:   Dilation: 4 Effacement (%): 60 Station: -3 Exam by:: Erica Oldee Carter RN   Labs: Lab Results  Component Value Date   WBC 8.0 07/16/2014   HGB 9.8* 07/16/2014   HCT 30.2* 07/16/2014   MCV 69.7* 07/16/2014   PLT 221 07/16/2014    Assessment / Plan: PROM, no contractions  Labor: Not in labor, will start Pitocin Fetal Wellbeing:  Category I Pain Control:  plans epidural when in labor Pre-eclampsia: no s/s I/D:  GBS negative Anticipated MOD:  NSVD  Erica [name] SNM 07/16/2014, 10:07 AM

## 2014-07-16 NOTE — Progress Notes (Signed)
Erica [name] is a 19 y.o. G2P1001 at 6551w3d admitted for PROM  Subjective: Denies pain with contractions.  Objective: BP 125/76 mmHg  Pulse 105  Temp(Src) 98.8 F (37.1 C) (Oral)  Resp 18  Ht 5\' 3"  (1.6 m)  Wt 80.287 kg (177 lb)  BMI 31.36 kg/m2  LMP 09/25/2013    FHT:  FHR: 140 bpm, variability: moderate,  accelerations:  Abscent,  decelerations:  Absent UC:   Regular, mild  SVE:   Dilation: 4 Effacement (%): 60 Station: -2 Exam by:: Renaldo HarrisonGoss, RNC  Pitocin @ 8 mu/min  Labs: Lab Results  Component Value Date   WBC 8.0 07/16/2014   HGB 9.8* 07/16/2014   HCT 30.2* 07/16/2014   MCV 69.7* 07/16/2014   PLT 221 07/16/2014    Assessment / Plan: Induction of labor due to PROM  Labor: Progressing normally Fetal Wellbeing:  Category I Pain Control:  planning epidural when in pain Pre-eclampsia: no s/s I/D:  GBS negative Anticipated MOD:  NSVD  Erica [name] SNM 07/16/2014, 12:32 PM

## 2014-07-16 NOTE — MAU Note (Signed)
Pt to restroom to change clothes.  Reports srom , pants are wet.

## 2014-07-16 NOTE — Progress Notes (Signed)
Patient tearful and states she does not want her husband/FOB to visit. Pt said she found out she has chlamydia today and that her and her husband have had a rough patch. Husband has been living with his cousin. Patient denied support from chaplain or social work at this time.

## 2014-07-16 NOTE — Progress Notes (Signed)
Erica PersonDanielle [name] is a 19 y.o. G2P1001 at 7319w3d admitted for induction of labor due to PROM.  Subjective: Comfortable, but feels mild pressure.  Objective: BP 128/86 mmHg  Pulse 93  Temp(Src) 98.3 F (36.8 C) (Oral)  Resp 16  Ht 5\' 3"  (1.6 m)  Wt 80.287 kg (177 lb)  BMI 31.36 kg/m2  SpO2 100%  LMP 09/25/2013    FHT:  FHR: 145 bpm, variability: moderate,  accelerations:  Present,  decelerations:  Absent UC:   regular, every 2 minutes  SVE:   Dilation: 9 Effacement (%): 90 Station: 0 Exam by:: C. BLair RN L. Sabryn Preslar CNM  Pitocin @ 10 mu/min  Labs: Lab Results  Component Value Date   WBC 8.0 07/16/2014   HGB 9.8* 07/16/2014   HCT 30.2* 07/16/2014   MCV 69.7* 07/16/2014   PLT 221 07/16/2014    Assessment / Plan: Induction of labor due to PROM,  progressing well on pitocin  Labor: Progressing normally Fetal Wellbeing:  Category I Pain Control:  Epidural Pre-eclampsia: no s/s I/D:  GBS negative, CT positive, treated Anticipated MOD:  NSVD  Erica [name] SNM 07/16/2014, 8:06 PM

## 2014-07-16 NOTE — Anesthesia Procedure Notes (Signed)
Epidural Patient location during procedure: OB Start time: 07/16/2014 1:50 PM End time: 07/16/2014 1:54 PM  Staffing Anesthesiologist: Leilani AbleHATCHETT, Makyle Eslick Performed by: anesthesiologist   Preanesthetic Checklist Completed: patient identified, surgical consent, pre-op evaluation, timeout performed, IV checked, risks and benefits discussed and monitors and equipment checked  Epidural Patient position: sitting Prep: site prepped and draped and DuraPrep Patient monitoring: continuous pulse ox and blood pressure Approach: midline Location: L3-L4 Injection technique: LOR air  Needle:  Needle type: Tuohy  Needle gauge: 17 G Needle length: 9 cm and 9 Catheter type: closed end flexible Catheter size: 19 Gauge Catheter at skin depth: 10 cm Test dose: negative and Other  Assessment Sensory level: T9 Events: blood not aspirated, injection not painful, no injection resistance, negative IV test and no paresthesia

## 2014-07-16 NOTE — H&P (Signed)
  Erica [name] is a 19 y.o. female presenting for PROM. History  Patient reports leaking clear fluid since 5am. No bleeding. +FM, no contractions. OB History    Gravida Para Term Preterm AB TAB SAB Ectopic Multiple Living   2 1 1       1      Past Medical History  Diagnosis Date  . Ganglion cyst of wrist     left  . Yeast infection 04/07/2013  . Anemia    Past Surgical History  Procedure Laterality Date  . No past surgeries     Family History: family history includes Diabetes in her maternal grandmother and paternal grandmother; Multiple sclerosis in her cousin. Social History:  reports that she has never smoked. She has never used smokeless tobacco. She reports that she does not drink alcohol or use illicit drugs.   Prenatal Transfer Tool  Maternal Diabetes: No Genetic Screening: Normal Maternal Ultrasounds/Referrals: Normal Fetal Ultrasounds or other Referrals:  None Maternal Substance Abuse:  No Significant Maternal Medications:  None Significant Maternal Lab Results:  Lab values include: Group B Strep negative, Other: CT pos 07/06/14 Other Comments:  None  ROS  Dilation: 4 Effacement (%): 60 Station: -3 Exam by:: Coca ColaDee Carter RN Blood pressure 118/80, pulse 97, temperature 97.9 F (36.6 C), temperature source Oral, resp. rate 16, height 5\' 3"  (1.6 m), weight 177 lb (80.287 kg), last menstrual period 09/25/2013, not currently breastfeeding. Maternal Exam:  Uterine Assessment: No contractions  Abdomen: Patient reports no abdominal tenderness. Introitus: Normal vulva. Normal vagina.  Ferning test: positive.  Amniotic fluid character: clear.  Pelvis: adequate for delivery.   Cervix: Cervix evaluated by digital exam.     Fetal Exam Fetal Monitor Review: Mode: ultrasound.   Baseline rate: 135.  Variability: moderate (6-25 bpm).   Pattern: accelerations present and no decelerations.    Fetal State Assessment: Category I - tracings are normal.     Physical  Exam  Prenatal labs: ABO, Rh:   Antibody: NEG (12/22 0945) Rubella: 1.79 (07/29 1539) RPR: NON REAC (12/22 0945)  HBsAg: NEGATIVE (07/29 1539)  HIV: NONREACTIVE (12/22 0945)  GBS:   negative  Assessment/Plan: Labor Plan #Labor: PROM, previous precipitous delivery, will give her a few hours then start pit if no labor #Pain: epidural on request #FWB: cat 1 #ID: GBS negative, ordered 1g azithro for chlamydia   Beverely Lowdamo, Elena 07/16/2014, 8:02 AM  OB fellow attestation:  I have seen and examined this patient; I agree with above documentation in the resident's note.   Erica PersonDanielle [name] is a 19 y.o. [phone] here for rupture of membranes  PE: BP 138/85 mmHg  Pulse 64  Temp(Src) 98.4 F (36.9 C) (Oral)  Resp 18  Ht 5\' 3"  (1.6 m)  Wt 177 lb (80.287 kg)  BMI 31.36 kg/m2  SpO2 100%  LMP 09/25/2013  Breastfeeding? Unknown Gen: calm comfortable, NAD Resp: normal effort, no distress Abd: gravid  ROS, labs, PMH reviewed  Plan: MOF: bottle +chlamydia: azithromycin now  Nataniel Gasper ROCIO 07/18/2014, 3:10 PM

## 2014-07-16 NOTE — MAU Provider Note (Signed)
Ms. Erica [name] is a 19 y.o. G2P1001 at 3519w3d  who presents to MAU today complaining contractions and LOF. Patient states frequent, but irregular contractions. She states LOF since 0530 today. She denies vaginal bleeding. She reports good fetal movement.   BP 118/80 mmHg  Pulse 97  Temp(Src) 97.9 F (36.6 C) (Oral)  Resp 16  Ht 5\' 3"  (1.6 m)  Wt 177 lb (80.287 kg)  BMI 31.36 kg/m2  LMP 09/25/2013 GENERAL: Well-developed, well-nourished female in no acute distress.  HEENT: Normocephalic, atraumatic.  LUNGS: Normal effort HEART: Regular rate  ABDOMEN: Soft, nontender, nondistended.  PELVIC: Normal external female genitalia. Vagina is pink and rugated.  Normal discharge.  Positive pooling. Gravid uterus.   EXTREMITIES: No cyanosis, clubbing, or edema Dilation: 4 Effacement (%): 60 Station: -3 Presentation: Vertex Exam by:: [name] RN  Fern - positive  Fetal Monitoring:  Baseline: 135 bpm, moderate variability, + accelerations, no decelerations Contractions: irregular with moderate UI  A: SROM  P: Report given to RN to contact resident on call for further instructions  Marny LowensteinJulie N Wenzel, PA-C 07/16/2014 7:49 AM

## 2014-07-17 LAB — CBC
HCT: 25.3 % — ABNORMAL LOW (ref 36.0–46.0)
Hemoglobin: 8.3 g/dL — ABNORMAL LOW (ref 12.0–15.0)
MCH: 22.7 pg — AB (ref 26.0–34.0)
MCHC: 32.8 g/dL (ref 30.0–36.0)
MCV: 69.1 fL — AB (ref 78.0–100.0)
Platelets: 195 10*3/uL (ref 150–400)
RBC: 3.66 MIL/uL — ABNORMAL LOW (ref 3.87–5.11)
RDW: 15.8 % — AB (ref 11.5–15.5)
WBC: 12.2 10*3/uL — ABNORMAL HIGH (ref 4.0–10.5)

## 2014-07-17 LAB — RPR: RPR: NONREACTIVE

## 2014-07-17 MED ORDER — TETANUS-DIPHTH-ACELL PERTUSSIS 5-2.5-18.5 LF-MCG/0.5 IM SUSP: 0.5000 mL | Freq: Once | INTRAMUSCULAR | Status: DC

## 2014-07-17 MED ORDER — FERROUS SULFATE 325 (65 FE) MG PO TABS
325.0000 mg | ORAL_TABLET | Freq: Two times a day (BID) | ORAL | Status: DC
  Administered 2014-07-17 – 2014-07-18 (×2): 325 mg via ORAL
  Filled 2014-07-17 (×2): qty 1

## 2014-07-17 MED ORDER — BENZOCAINE-MENTHOL 20-0.5 % EX AERO
1.0000 "application " | INHALATION_SPRAY | CUTANEOUS | Status: DC | PRN
  Administered 2014-07-17: 1 via TOPICAL
  Filled 2014-07-17: qty 56

## 2014-07-17 MED ORDER — OXYCODONE-ACETAMINOPHEN 5-325 MG PO TABS: 1.0000 | ORAL_TABLET | ORAL | Status: DC | PRN

## 2014-07-17 MED ORDER — DIPHENHYDRAMINE HCL 25 MG PO CAPS: 25.0000 mg | ORAL_CAPSULE | Freq: Four times a day (QID) | ORAL | Status: DC | PRN

## 2014-07-17 MED ORDER — ONDANSETRON HCL 4 MG/2ML IJ SOLN: 4.0000 mg | INTRAMUSCULAR | Status: DC | PRN

## 2014-07-17 MED ORDER — SENNOSIDES-DOCUSATE SODIUM 8.6-50 MG PO TABS
2.0000 | ORAL_TABLET | ORAL | Status: DC
  Administered 2014-07-17 – 2014-07-18 (×2): 2 via ORAL
  Filled 2014-07-17 (×2): qty 2

## 2014-07-17 MED ORDER — ZOLPIDEM TARTRATE 5 MG PO TABS: 5.0000 mg | ORAL_TABLET | Freq: Every evening | ORAL | Status: DC | PRN

## 2014-07-17 MED ORDER — PRENATAL MULTIVITAMIN CH
1.0000 | ORAL_TABLET | Freq: Every day | ORAL | Status: DC
  Administered 2014-07-17 – 2014-07-18 (×2): 1 via ORAL
  Filled 2014-07-17 (×2): qty 1

## 2014-07-17 MED ORDER — SIMETHICONE 80 MG PO CHEW: 80.0000 mg | CHEWABLE_TABLET | ORAL | Status: DC | PRN

## 2014-07-17 MED ORDER — DIBUCAINE 1 % RE OINT: 1.0000 "application " | TOPICAL_OINTMENT | RECTAL | Status: DC | PRN

## 2014-07-17 MED ORDER — ONDANSETRON HCL 4 MG PO TABS: 4.0000 mg | ORAL_TABLET | ORAL | Status: DC | PRN

## 2014-07-17 MED ORDER — LANOLIN HYDROUS EX OINT: TOPICAL_OINTMENT | CUTANEOUS | Status: DC | PRN

## 2014-07-17 MED ORDER — OXYCODONE-ACETAMINOPHEN 5-325 MG PO TABS: 2.0000 | ORAL_TABLET | ORAL | Status: DC | PRN

## 2014-07-17 MED ORDER — WITCH HAZEL-GLYCERIN EX PADS: 1.0000 "application " | MEDICATED_PAD | CUTANEOUS | Status: DC | PRN

## 2014-07-17 NOTE — Addendum Note (Signed)
Addendum  created 07/17/14 2215 by Leilani AbleFranklin Kyjuan Gause, MD   Modules edited: Notes Section   Notes Section:  File: [id number]

## 2014-07-17 NOTE — Progress Notes (Signed)
Post Partum Day #1 Subjective: no complaints, up ad lib and tolerating PO; pt very drowsy/tired but doing well  Objective: Blood pressure 124/80, pulse 65, temperature 98.3 F (36.8 C), temperature source Oral, resp. rate 18, height 5\' 3"  (1.6 m), weight 80.287 kg (177 lb), last menstrual period 09/25/2013, SpO2 100 %, unknown if currently breastfeeding.  Physical Exam:  General: alert, cooperative and no distress Lochia: appropriate Uterine Fundus: firm IDVT Evaluation: No evidence of DVT seen on physical exam.   Recent Labs  07/16/14 0850 07/17/14 0615  HGB 9.8* 8.3*  HCT 30.2* 25.3*    Assessment/Plan: Plan for discharge tomorrow  Begin FeSO4 BID   LOS: 1 day   Cam HaiSHAW, KIMBERLY CNM 07/17/2014, 9:47 AM

## 2014-07-17 NOTE — Anesthesia Postprocedure Evaluation (Signed)
Anesthesia Post Note  Patient: Erica [name]  Procedure(s) Performed: * No procedures listed *  Anesthesia type: Epidural  Patient location: Mother/Baby  Post pain: Pain level controlled  Post assessment: Post-op Vital signs reviewed  Last Vitals:  Filed Vitals:   07/17/14 1802  BP: 119/73  Pulse: 72  Temp: 36.7 C  Resp: 18    Post vital signs: Reviewed  Level of consciousness: awake  Complications: No apparent anesthesia complications

## 2014-07-18 MED ORDER — AZITHROMYCIN 500 MG PO TABS: 1000.0000 mg | ORAL_TABLET | Freq: Once | ORAL | Status: DC
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
## 2014-07-18 NOTE — Addendum Note (Signed)
Addendum  created 07/18/14 0931 by Elbert Ewingsolleen S Nandi Tonnesen, CRNA   Modules edited: Charges VN, Notes Section   Notes Section:  File: [id number]

## 2014-07-18 NOTE — Discharge Summary (Signed)
Obstetric Discharge Summary Reason for Admission: rupture of membranes Prenatal Procedures: none Intrapartum Procedures: spontaneous vaginal delivery Postpartum Procedures: none Complications-Operative and Postpartum: none HEMOGLOBIN  Date Value Ref Range Status  07/17/2014 8.3* 12.0 - 15.0 g/dL Final  45/40/981107/15/2014 91.411.1 g/dL Final   HCT  Date Value Ref Range Status  07/17/2014 25.3* 36.0 - 46.0 % Final  12/09/2012 35 % Final    Patient is 19 y.o. [id number] 2497w3d presented with SROM, notified of +chlamydia tx from 10d prior and subsequently treated.  She missed her f/u appt and at which time she would have likely been informed of +chlamydia.  Today doing well, no complaints, has several questions regarding +chlamydia.  Discussed chlamydia/STDs and need for partner to be treated, I gave rx for partner.  Also advised partner to be tested for all STDs in 1 month.   Delivery Note At 10:23 PM a viable female was delivered via Vaginal, Spontaneous Delivery (Presentation: Left Occiput Anterior). APGAR: 9, ; weight pending.  Placenta status: delivered intact, . Cord: 3 vessel, no nuchal with the following complications: None. Cord pH: n/a  Anesthesia: Epidural  Episiotomy: none Lacerations: none Suture Repair: n/a Est. Blood Loss (mL): 400mL   Healthy female delivered over intact perineum. Minimal pushing required. Immediate cry, vigorous infant placed on mom's chest.   Mom to postpartum. Baby to Couplet care / Skin to Skin.  Physical Exam:  General: alert and cooperative Lochia: appropriate Uterine Fundus: firm DVT Evaluation: No evidence of DVT seen on physical exam.  Discharge Diagnoses: Term Pregnancy-delivered  Discharge Information: Date: 07/18/2014 Activity: pelvic rest Diet: routine Medications: azithromycin 1000mg  for chlamydia tx of partner Condition: stable Instructions: refer to practice specific booklet Discharge to: home Follow-up Information    Follow up  with [facility] In 5 weeks.   Contact information:   [address] [place] [phone]      Newborn Data: Live born female  Birth Weight: 8 lb 7.4 oz (3839 g) APGAR: 9,   Home with mother.  Aarin Sparkman ROCIO 07/18/2014, 10:49 AM

## 2014-07-18 NOTE — Discharge Instructions (Signed)
Vaginal Delivery, Care After °Refer to this sheet in the next few weeks. These discharge instructions provide you with information on caring for yourself after delivery. Your caregiver may also give you specific instructions. Your treatment has been planned according to the most current medical practices available, but problems sometimes occur. Call your caregiver if you have any problems or questions after you go home. °HOME CARE INSTRUCTIONS °· Take over-the-counter or prescription medicines only as directed by your caregiver or pharmacist. °· Do not drink alcohol, especially if you are breastfeeding or taking medicine to relieve pain. °· Do not chew or smoke tobacco. °· Do not use illegal drugs. °· Continue to use good perineal care. Good perineal care includes: °· Wiping your perineum from front to back. °· Keeping your perineum clean. °· Do not use tampons or douche until your caregiver says it is okay. °· Shower, wash your hair, and take tub baths as directed by your caregiver. °· Wear a well-fitting bra that provides breast support. °· Eat healthy foods. °· Drink enough fluids to keep your urine clear or pale yellow. °· Eat high-fiber foods such as whole grain cereals and breads, brown rice, beans, and fresh fruits and vegetables every day. These foods may help prevent or relieve constipation. °· Follow your caregiver's recommendations regarding resumption of activities such as climbing stairs, driving, lifting, exercising, or traveling. °· Talk to your caregiver about resuming sexual activities. Resumption of sexual activities is dependent upon your risk of infection, your rate of healing, and your comfort and desire to resume sexual activity. °· Try to have someone help you with your household activities and your newborn for at least a few days after you leave the hospital. °· Rest as much as possible. Try to rest or take a nap when your newborn is sleeping. °· Increase your activities gradually. °· Keep  all of your scheduled postpartum appointments. It is very important to keep your scheduled follow-up appointments. At these appointments, your caregiver will be checking to make sure that you are healing physically and emotionally. °SEEK MEDICAL CARE IF:  °· You are passing large clots from your vagina. Save any clots to show your caregiver. °· You have a foul smelling discharge from your vagina. °· You have trouble urinating. °· You are urinating frequently. °· You have pain when you urinate. °· You have a change in your bowel movements. °· You have increasing redness, pain, or swelling near your vaginal incision (episiotomy) or vaginal tear. °· You have pus draining from your episiotomy or vaginal tear. °· Your episiotomy or vaginal tear is separating. °· You have painful, hard, or reddened breasts. °· You have a severe headache. °· You have blurred vision or see spots. °· You feel sad or depressed. °· You have thoughts of hurting yourself or your newborn. °· You have questions about your care, the care of your newborn, or medicines. °· You are dizzy or light-headed. °· You have a rash. °· You have nausea or vomiting. °· You were breastfeeding and have not had a menstrual period within 12 weeks after you stopped breastfeeding. °· You are not breastfeeding and have not had a menstrual period by the 12th week after delivery. °· You have a fever. °SEEK IMMEDIATE MEDICAL CARE IF:  °· You have persistent pain. °· You have chest pain. °· You have shortness of breath. °· You faint. °· You have leg pain. °· You have stomach pain. °· Your vaginal bleeding saturates two or more sanitary pads   in 1 hour. MAKE SURE YOU:   Understand these instructions.  Will watch your condition.  Will get help right away if you are not doing well or get worse. Document Released: 05/11/2000 Document Revised: 09/28/2013 Document Reviewed: 01/09/2012 [facility] Patient Information 2015 [place], [place]. This information is not intended to  replace advice given to you by your health care provider. Make sure you discuss any questions you have with your health care provider.   Chlamydia Chlamydia is an infection. It is spread from one person to another person during sexual contact. This infection can be in the cervix, urine tube (urethra), throat, or bottom (rectum). This infection needs treatment. HOME CARE   Take your medicines (antibiotics) as told. Finish them even if you start to feel better.  Only take medicine as told by your doctor.  Tell your sex partner(s) that you have chlamydia. They must also be treated.  Do not have sex until your doctor says it is okay.  Rest.  Eat healthy. Drink enough fluids to keep your pee (urine) clear or pale yellow.  Keep all doctor visits as told. GET HELP IF:  You have pain when you pee.  You have belly pain.  You have vaginal discharge.  You have pain during sex.  You have bleeding between periods and after sex.  You have a fever. GET HELP RIGHT AWAY IF:   You feel sick to your stomach (nauseous) or you throw up (vomit).  You sweat much more than normal (diaphoresis).  You have trouble swallowing. MAKE SURE YOU:   Understand these instructions.  Will watch your condition.  Will get help right away if you are not doing well or get worse. Document Released: 02/21/2008 Document Revised: 09/28/2013 Document Reviewed: 01/19/2013 [facility] Patient Information 2015 [place], [place]. This information is not intended to replace advice given to you by your health care provider. Make sure you discuss any questions you have with your health care provider.

## 2014-07-18 NOTE — Anesthesia Postprocedure Evaluation (Signed)
  Anesthesia Post-op Note  Patient: Erica [name]  Procedure(s) Performed: * No procedures listed *  Patient Location: PACU and Mother/Baby  Anesthesia Type:Epidural  Level of Consciousness: awake, alert  and oriented  Airway and Oxygen Therapy: Patient Spontanous Breathing  Post-op Pain: mild  Post-op Assessment: Post-op Vital signs reviewed, Patient's Cardiovascular Status Stable, Respiratory Function Stable, No signs of Nausea or vomiting, Adequate PO intake, Pain level controlled, No headache, No backache, No residual numbness and No residual motor weakness  Post-op Vital Signs: Reviewed and stable  Last Vitals:  Filed Vitals:   07/18/14 0645  BP: 138/85  Pulse: 64  Temp: 36.9 C  Resp: 18    Complications: No apparent anesthesia complications

## 2014-07-19 NOTE — Progress Notes (Signed)
Ur chart review completed.  

## 2014-07-20 ENCOUNTER — Encounter: Payer: Medicaid Other | Admitting: Obstetrics & Gynecology

## 2014-07-22 ENCOUNTER — Encounter (HOSPITAL_COMMUNITY): Payer: Self-pay | Admitting: *Deleted

## 2014-07-27 ENCOUNTER — Telehealth: Payer: Self-pay | Admitting: Adult Health

## 2014-07-27 ENCOUNTER — Ambulatory Visit (INDEPENDENT_AMBULATORY_CARE_PROVIDER_SITE_OTHER): Payer: Medicaid Other | Admitting: Adult Health

## 2014-07-27 ENCOUNTER — Encounter: Payer: Self-pay | Admitting: Adult Health

## 2014-07-27 VITALS — BP 132/94 | HR 72 | Ht 63.0 in | Wt 151.0 lb

## 2014-07-27 DIAGNOSIS — R102 Pelvic and perineal pain: Secondary | ICD-10-CM | POA: Diagnosis not present

## 2014-07-27 DIAGNOSIS — IMO0001 Reserved for inherently not codable concepts without codable children: Secondary | ICD-10-CM

## 2014-07-27 DIAGNOSIS — R030 Elevated blood-pressure reading, without diagnosis of hypertension: Secondary | ICD-10-CM | POA: Diagnosis not present

## 2014-07-27 DIAGNOSIS — Z113 Encounter for screening for infections with a predominantly sexual mode of transmission: Secondary | ICD-10-CM

## 2014-07-27 DIAGNOSIS — A749 Chlamydial infection, unspecified: Secondary | ICD-10-CM | POA: Insufficient documentation

## 2014-07-27 DIAGNOSIS — O98819 Other maternal infectious and parasitic diseases complicating pregnancy, unspecified trimester: Secondary | ICD-10-CM

## 2014-07-27 HISTORY — DX: Pelvic and perineal pain: R10.2

## 2014-07-27 HISTORY — DX: Reserved for inherently not codable concepts without codable children: IMO0001

## 2014-07-27 HISTORY — DX: Chlamydial infection, unspecified: A74.9

## 2014-07-27 HISTORY — DX: Encounter for screening for infections with a predominantly sexual mode of transmission: Z11.3

## 2014-07-27 NOTE — Telephone Encounter (Signed)
Pt informed note to return to work on 08/27/2014 left at front desk for pick up, ok per Cyril MourningJennifer Griffin, NP

## 2014-07-27 NOTE — Progress Notes (Addendum)
Subjective:     Patient ID: Erica [name], female   DOB: 07-24-1995, 19 y.o.   MRN: [id number]  HPI Erica [name] is a 19 year old black female in complaining of pelvic pain esp RLQ that comes and goes since delivery,is quick and sharp.She had a vaginal delivery 07/16/14, baby girl.She had epidural and no stitches.She was told she had Chlamydia and was treated.The +chlamyida was 07/06/14, she had negative one 05/04/14.She was treated in hospital and partner was treated, she was told in hospital, could have scarring from chlamydia.She has had unprotected sex since delivery.She denies any nausea or vomiting or headache, she had GI bug and had some diarrhea.She is bottle feeding and has postpartum visit 3/28.She wants Nexplanon.She says she is stressed at times is getting divorce.  Review of Systems See HPI for positives, all other systems negative  Reviewed past medical,surgical, social and family history. Reviewed medications and allergies.     Objective:   Physical Exam BP 132/94 mmHg  Pulse 72  Ht 5\' 3"  (1.6 m)  Wt 151 lb (68.493 kg)  BMI 26.76 kg/m2  Breastfeeding? No Skin warm and dry.Lungs: clear to ausculation bilaterally. Cardiovascular: regular rate and rhythm.   Pelvic: external genitalia is normal in appearance no lesions, vagina: bloody discharge without odor,urethra has no lesions or masses noted, cervix:smooth and bulbous, uterus: normal size, shape and contour, non tender, no masses felt, adnexa: no masses or tenderness noted. Bladder is non tender and no masses felt. Abdomen is soft, non tender, no HSM. GC/CHL obtained.  DTRs 1+ no clonus. She declines pain meds, can use motrin for pain if needed.  Assessment:     Pelvic pain  STD screening History of chlamydia during third trimester  Elevated BP     Plan:     Check GC/CHL HIV,RPR,HSV2 and CBC and CMP,will talk when labs back Return in 1 week for BP check Review handout on pelvic pain NO SEX,not even with condom til  after nexplanon inserted

## 2014-07-27 NOTE — Telephone Encounter (Signed)
Already taken care of. Chrystal put note up front for pt to pick up

## 2014-07-27 NOTE — Patient Instructions (Signed)
Pelvic Pain Female pelvic pain can be caused by many different things and start from a variety of places. Pelvic pain refers to pain that is located in the lower half of the abdomen and between your hips. The pain may occur over a short period of time (acute) or may be reoccurring (chronic). The cause of pelvic pain may be related to disorders affecting the female reproductive organs (gynecologic), but it may also be related to the bladder, kidney stones, an intestinal complication, or muscle or skeletal problems. Getting help right away for pelvic pain is important, especially if there has been severe, sharp, or a sudden onset of unusual pain. It is also important to get help right away because some types of pelvic pain can be life threatening.  CAUSES  Below are only some of the causes of pelvic pain. The causes of pelvic pain can be in one of several categories.   Gynecologic.  Pelvic inflammatory disease.  Sexually transmitted infection.  Ovarian cyst or a twisted ovarian ligament (ovarian torsion).  Uterine lining that grows outside the uterus (endometriosis).  Fibroids, cysts, or tumors.  Ovulation.  Pregnancy.  Pregnancy that occurs outside the uterus (ectopic pregnancy).  Miscarriage.  Labor.  Abruption of the placenta or ruptured uterus.  Infection.  Uterine infection (endometritis).  Bladder infection.  Diverticulitis.  Miscarriage related to a uterine infection (septic abortion).  Bladder.  Inflammation of the bladder (cystitis).  Kidney stone(s).  Gastrointestinal.  Constipation.  Diverticulitis.  Neurologic.  Trauma.  Feeling pelvic pain because of mental or emotional causes (psychosomatic).  Cancers of the bowel or pelvis. EVALUATION  Your caregiver will want to take a careful history of your concerns. This includes recent changes in your health, a careful gynecologic history of your periods (menses), and a sexual history. Obtaining your family  history and medical history is also important. Your caregiver may suggest a pelvic exam. A pelvic exam will help identify the location and severity of the pain. It also helps in the evaluation of which organ system may be involved. In order to identify the cause of the pelvic pain and be properly treated, your caregiver may order tests. These tests may include:   A pregnancy test.  Pelvic ultrasonography.  An X-ray exam of the abdomen.  A urinalysis or evaluation of vaginal discharge.  Blood tests. HOME CARE INSTRUCTIONS   Only take over-the-counter or prescription medicines for pain, discomfort, or fever as directed by your caregiver.   Rest as directed by your caregiver.   Eat a balanced diet.   Drink enough fluids to make your urine clear or pale yellow, or as directed.   Avoid sexual intercourse if it causes pain.   Apply warm or cold compresses to the lower abdomen depending on which one helps the pain.   Avoid stressful situations.   Keep a journal of your pelvic pain. Write down when it started, where the pain is located, and if there are things that seem to be associated with the pain, such as food or your menstrual cycle.  Follow up with your caregiver as directed.  SEEK MEDICAL CARE IF:  Your medicine does not help your pain.  You have abnormal vaginal discharge. SEEK IMMEDIATE MEDICAL CARE IF:   You have heavy bleeding from the vagina.   Your pelvic pain increases.   You feel light-headed or faint.   You have chills.   You have pain with urination or blood in your urine.   You have uncontrolled diarrhea   or vomiting.   You have a fever or persistent symptoms for more than 3 days.  You have a fever and your symptoms suddenly get worse.   You are being physically or sexually abused.  MAKE SURE YOU:  Understand these instructions.  Will watch your condition.  Will get help if you are not doing well or get worse. Document Released:  04/10/2004 Document Revised: 09/28/2013 Document Reviewed: 09/03/2011 [facility]Exit[facility] Patient Information 2015 [place]ExitCare, [place]LLC. This information is not intended to replace advice given to you by your health care provider. Make sure you discuss any questions you have with your health care provider. Recheck BP in 1 week

## 2014-07-28 LAB — CBC
HEMATOCRIT: 37.8 % (ref 34.0–46.6)
HEMOGLOBIN: 11.6 g/dL (ref 11.1–15.9)
MCH: 21.8 pg — AB (ref 26.6–33.0)
MCHC: 30.7 g/dL — ABNORMAL LOW (ref 31.5–35.7)
MCV: 71 fL — AB (ref 79–97)
Platelets: 324 10*3/uL (ref 150–379)
RBC: 5.33 x10E6/uL — ABNORMAL HIGH (ref 3.77–5.28)
RDW: 17.5 % — ABNORMAL HIGH (ref 12.3–15.4)
WBC: 6.7 10*3/uL (ref 3.4–10.8)

## 2014-07-28 LAB — COMPREHENSIVE METABOLIC PANEL
A/G RATIO: 1.2 (ref 1.1–2.5)
ALK PHOS: 125 IU/L — AB (ref 39–117)
ALT: 13 IU/L (ref 0–32)
AST: 13 IU/L (ref 0–40)
Albumin: 3.8 g/dL (ref 3.5–5.5)
BUN / CREAT RATIO: 10 (ref 8–20)
BUN: 6 mg/dL (ref 6–20)
Bilirubin Total: 0.2 mg/dL (ref 0.0–1.2)
CO2: 21 mmol/L (ref 18–29)
Calcium: 9 mg/dL (ref 8.7–10.2)
Chloride: 104 mmol/L (ref 97–108)
Creatinine, Ser: 0.61 mg/dL (ref 0.57–1.00)
GFR, EST AFRICAN AMERICAN: 151 mL/min/{1.73_m2} (ref >59)
GFR, EST NON AFRICAN AMERICAN: 131 mL/min/{1.73_m2} (ref >59)
Globulin, Total: 3.2 g/dL (ref 1.5–4.5)
Glucose: 84 mg/dL (ref 65–99)
Potassium: 4.4 mmol/L (ref 3.5–5.2)
SODIUM: 139 mmol/L (ref 134–144)
Total Protein: 7 g/dL (ref 6.0–8.5)

## 2014-07-28 LAB — HSV 2 ANTIBODY, IGG: HSV 2 Glycoprotein G Ab, IgG: <0.91 index (ref 0.00–0.90)

## 2014-07-28 LAB — RPR: RPR: NONREACTIVE

## 2014-07-28 LAB — HIV ANTIBODY (ROUTINE TESTING W REFLEX): HIV Screen 4th Generation wRfx: NONREACTIVE

## 2014-07-31 LAB — GC/CHLAMYDIA PROBE AMP
CHLAMYDIA, DNA PROBE: POSITIVE — AB
NEISSERIA GONORRHOEAE BY PCR: NEGATIVE

## 2014-08-02 ENCOUNTER — Encounter: Payer: Self-pay | Admitting: Adult Health

## 2014-08-02 ENCOUNTER — Ambulatory Visit (INDEPENDENT_AMBULATORY_CARE_PROVIDER_SITE_OTHER): Payer: Medicaid Other | Admitting: Adult Health

## 2014-08-02 VITALS — BP 130/100 | HR 62 | Ht 63.0 in | Wt 155.5 lb

## 2014-08-02 DIAGNOSIS — R030 Elevated blood-pressure reading, without diagnosis of hypertension: Secondary | ICD-10-CM

## 2014-08-02 DIAGNOSIS — A749 Chlamydial infection, unspecified: Secondary | ICD-10-CM | POA: Diagnosis not present

## 2014-08-02 DIAGNOSIS — IMO0001 Reserved for inherently not codable concepts without codable children: Secondary | ICD-10-CM

## 2014-08-02 HISTORY — DX: Chlamydial infection, unspecified: A74.9

## 2014-08-02 MED ORDER — AZITHROMYCIN 500 MG PO TABS: ORAL_TABLET | ORAL | Status: DC

## 2014-08-02 MED ORDER — HYDROCHLOROTHIAZIDE 12.5 MG PO CAPS: 12.5000 mg | ORAL_CAPSULE | Freq: Every day | ORAL | Status: DC

## 2014-08-02 NOTE — Progress Notes (Signed)
Subjective:     Patient ID: Erica [name], female   DOB: 05-25-1996, 19 y.o.   MRN: [id number]  HPI Erica [name] is  19 year old black female in for BP check and review labs.  Review of Systems Patient denies any headaches, hearing loss, fatigue, blurred vision, shortness of breath, chest pain, abdominal pain, problems with bowel movements, urination, or intercourse. No joint pain or mood swings.Denies pelvic pain. Reviewed past medical,surgical, social and family history. Reviewed medications and allergies.     Objective:   Physical Exam BP 130/100 mmHg  Pulse 62  Ht 5\' 3"  (1.6 m)  Wt 155 lb 8 oz (70.534 kg)  BMI 27.55 kg/m2  Breastfeeding? No Skin warm and dry. Lungs: clear to ausculation bilaterally. Cardiovascular: regular rate and rhythm.Reviewed labs with her, has +chlamydia.    Assessment:     +chlamydia Elevated BP    Plan:     Rx azithromycin 500 mg #2 take 2 po now and Rx for same for partner Cliffor Pauline Ausleby Jr 02/11/90 Rx microzide 12.5 mg #30 1 daily with 1 refill Recheck BP in 1 week NO sex Decrease salt and sugars   Will send NCCDRC  POT at postpartum visit

## 2014-08-02 NOTE — Patient Instructions (Signed)
Decrease salt and sugar  Take BP pill daily Recheck in 1 week

## 2014-08-09 ENCOUNTER — Ambulatory Visit: Payer: Medicaid Other | Admitting: Adult Health

## 2014-08-17 ENCOUNTER — Ambulatory Visit: Payer: Medicaid Other | Admitting: Adult Health

## 2014-08-23 ENCOUNTER — Ambulatory Visit (INDEPENDENT_AMBULATORY_CARE_PROVIDER_SITE_OTHER): Payer: Medicaid Other | Admitting: Women's Health

## 2014-08-23 ENCOUNTER — Encounter: Payer: Self-pay | Admitting: Women's Health

## 2014-08-23 DIAGNOSIS — A749 Chlamydial infection, unspecified: Secondary | ICD-10-CM

## 2014-08-23 NOTE — Patient Instructions (Addendum)
NO SEX UNTIL THE NEXPLANON IS IN YOUR ARM!!! DO NOT TOUCH HIM

## 2014-08-23 NOTE — Progress Notes (Signed)
Patient ID: Erica PersonDanielle [name], female   DOB: 01/24/1996, 19 y.o.   MRN: [id number] Subjective:    Erica [name] is a 19 y.o. 412P2002 African American female who presents for a postpartum visit. She is 5 weeks postpartum following a spontaneous vaginal delivery at 38.3 gestational weeks. Anesthesia: epidural. I have fully reviewed the prenatal and intrapartum course. She was treated for +CT on admission to hospital. Postpartum course has been complicated by pp htn, rx'd hctz 12.5mg  daily- never got filled. Baby's course has been uncomplicated. Baby is feeding by bottle. Bleeding no bleeding. Bowel function is normal. Bladder function is normal. Patient is sexually active. Last sexual activity: last night, 3/27. Contraception method is none and wants nexplanon. Postpartum depression screening: negative. Score 0.  Last pap <21YO.  The following portions of the patient's history were reviewed and updated as appropriate: allergies, current medications, past medical history, past surgical history and problem list.  Review of Systems Pertinent items are noted in HPI.   Filed Vitals:   08/23/14 1119  BP: 116/66  Pulse: 66  Height: 5\' 3"  (1.6 m)  Weight: 159 lb (72.122 kg)   No LMP recorded.  Objective:   General:  alert, cooperative and no distress   Breasts:  deferred, no complaints  Lungs: clear to auscultation bilaterally  Heart:  regular rate and rhythm  Abdomen: soft, nontender   Vulva: normal  Vagina: normal vagina  Cervix:  closed  Corpus: Well-involuted  Adnexa:  Non-palpable  Rectal Exam: No hemorrhoids        Assessment:   Postpartum exam 5 wks s/p SVB Bottlefeeding Depression screening Contraception counseling Recent unprotected sex  Treated for +CT at hospital before delivery Resolved pp HTN  Plan:  CT POC today Contraception: abstinence and nexplanon placement 4/6 Follow up in: 4/6 for bhcg am, then pm for nexplanon insertion   Marge DuncansBooker, Enisa Runyan Randall CNM,  [facility] HospitalWHNP-BC 08/23/2014 11:49 AM

## 2014-08-24 LAB — GC/CHLAMYDIA PROBE AMP
CHLAMYDIA, DNA PROBE: POSITIVE — AB
NEISSERIA GONORRHOEAE BY PCR: NEGATIVE

## 2014-08-25 ENCOUNTER — Telehealth: Payer: Self-pay | Admitting: Women's Health

## 2014-08-25 DIAGNOSIS — A749 Chlamydial infection, unspecified: Secondary | ICD-10-CM

## 2014-08-25 MED ORDER — AZITHROMYCIN 500 MG PO TABS: ORAL_TABLET | ORAL | Status: DC

## 2014-08-25 NOTE — Telephone Encounter (Signed)
Notified pt of persistent +CT, states they both took meds but did not wait 7d to have sex. Discussed importance of this and potential risks for persistent CT.  Rx azithromycin 1gm po x 1 for pt and partner Dorrene GermanClifford Eleby dob 02/11/90, nkda to [facility] [place]. Will do POC in 4wks.   Cheral MarkerKimberly R. Booker, CNM, [facility] LLCWHNP-BC 08/25/2014 4:24 PM

## 2014-08-25 NOTE — Telephone Encounter (Signed)
Calling to notify pt of persistent +CT. VM box not set up, will continue to try to contact.  Cheral MarkerKimberly R. Jerick Khachatryan, CNM, WHNP-BC 08/25/2014 10:00 AM

## 2014-09-01 ENCOUNTER — Encounter: Payer: Medicaid Other | Admitting: Women's Health

## 2014-09-01 ENCOUNTER — Other Ambulatory Visit: Payer: Medicaid Other

## 2014-10-11 ENCOUNTER — Encounter (HOSPITAL_COMMUNITY): Payer: Self-pay

## 2014-10-11 ENCOUNTER — Emergency Department (HOSPITAL_COMMUNITY)
Admission: EM | Admit: 2014-10-11 | Discharge: 2014-10-12 | Disposition: A | Discharge status: Home / Self Care | Payer: Medicaid Other | Attending: Emergency Medicine | Admitting: Emergency Medicine

## 2014-10-11 DIAGNOSIS — A64 Unspecified sexually transmitted disease: Secondary | ICD-10-CM | POA: Diagnosis not present

## 2014-10-11 DIAGNOSIS — Z8739 Personal history of other diseases of the musculoskeletal system and connective tissue: Secondary | ICD-10-CM | POA: Diagnosis not present

## 2014-10-11 DIAGNOSIS — Z862 Personal history of diseases of the blood and blood-forming organs and certain disorders involving the immune mechanism: Secondary | ICD-10-CM | POA: Insufficient documentation

## 2014-10-11 DIAGNOSIS — Z79899 Other long term (current) drug therapy: Secondary | ICD-10-CM | POA: Insufficient documentation

## 2014-10-11 DIAGNOSIS — N898 Other specified noninflammatory disorders of vagina: Secondary | ICD-10-CM | POA: Diagnosis present

## 2014-10-11 DIAGNOSIS — Z3202 Encounter for pregnancy test, result negative: Secondary | ICD-10-CM | POA: Diagnosis not present

## 2014-10-11 DIAGNOSIS — R1010 Upper abdominal pain, unspecified: Secondary | ICD-10-CM

## 2014-10-11 LAB — COMPREHENSIVE METABOLIC PANEL
ALBUMIN: 3.9 g/dL (ref 3.5–5.0)
ALT: 10 U/L — AB (ref 14–54)
AST: 13 U/L — ABNORMAL LOW (ref 15–41)
Alkaline Phosphatase: 83 U/L (ref 38–126)
Anion gap: 6 (ref 5–15)
BUN: 12 mg/dL (ref 6–20)
CALCIUM: 9 mg/dL (ref 8.9–10.3)
CO2: 22 mmol/L (ref 22–32)
CREATININE: 0.63 mg/dL (ref 0.44–1.00)
Chloride: 108 mmol/L (ref 101–111)
GFR calc Af Amer: >60 mL/min (ref >60)
GFR calc non Af Amer: >60 mL/min (ref >60)
Glucose, Bld: 94 mg/dL (ref 65–99)
Potassium: 3.8 mmol/L (ref 3.5–5.1)
Sodium: 136 mmol/L (ref 135–145)
TOTAL PROTEIN: 7.5 g/dL (ref 6.5–8.1)
Total Bilirubin: 0.4 mg/dL (ref 0.3–1.2)

## 2014-10-11 LAB — CBC WITH DIFFERENTIAL/PLATELET
BASOS ABS: 0 10*3/uL (ref 0.0–0.1)
Basophils Relative: 0 % (ref 0–1)
EOS ABS: 0.2 10*3/uL (ref 0.0–0.7)
Eosinophils Relative: 2 % (ref 0–5)
HEMATOCRIT: 36.6 % (ref 36.0–46.0)
Hemoglobin: 12.1 g/dL (ref 12.0–15.0)
Lymphocytes Relative: 23 % (ref 12–46)
Lymphs Abs: 2.5 10*3/uL (ref 0.7–4.0)
MCH: 24.8 pg — AB (ref 26.0–34.0)
MCHC: 33.1 g/dL (ref 30.0–36.0)
MCV: 75.2 fL — AB (ref 78.0–100.0)
Monocytes Absolute: 0.8 10*3/uL (ref 0.1–1.0)
Monocytes Relative: 8 % (ref 3–12)
Neutro Abs: 7.2 10*3/uL (ref 1.7–7.7)
Neutrophils Relative %: 67 % (ref 43–77)
Platelets: 320 10*3/uL (ref 150–400)
RBC: 4.87 MIL/uL (ref 3.87–5.11)
RDW: 18.2 % — ABNORMAL HIGH (ref 11.5–15.5)
WBC: 10.7 10*3/uL — ABNORMAL HIGH (ref 4.0–10.5)

## 2014-10-11 LAB — POC URINE PREG, ED: PREG TEST UR: NEGATIVE

## 2014-10-11 NOTE — ED Notes (Addendum)
Pt presents with c/o abdominal pain and vaginal discharge. Pt reports her symptoms started approx one week ago, reports she has had unprotected sex and would like to be tested for STD's.

## 2014-10-12 LAB — URINALYSIS, ROUTINE W REFLEX MICROSCOPIC
Bilirubin Urine: NEGATIVE
Glucose, UA: NEGATIVE mg/dL
HGB URINE DIPSTICK: NEGATIVE
Ketones, ur: NEGATIVE mg/dL
Nitrite: NEGATIVE
PROTEIN: NEGATIVE mg/dL
Specific Gravity, Urine: 1.014 (ref 1.005–1.030)
UROBILINOGEN UA: 0.2 mg/dL (ref 0.0–1.0)
pH: 5.5 (ref 5.0–8.0)

## 2014-10-12 LAB — WET PREP, GENITAL
Trich, Wet Prep: NONE SEEN
YEAST WET PREP: NONE SEEN

## 2014-10-12 LAB — GC/CHLAMYDIA PROBE AMP (~~LOC~~) NOT AT ARMC
Chlamydia: POSITIVE — AB
Neisseria Gonorrhea: NEGATIVE

## 2014-10-12 LAB — RPR: RPR: NONREACTIVE

## 2014-10-12 LAB — URINE MICROSCOPIC-ADD ON

## 2014-10-12 LAB — HIV ANTIBODY (ROUTINE TESTING W REFLEX): HIV Screen 4th Generation wRfx: NONREACTIVE

## 2014-10-12 MED ORDER — LIDOCAINE HCL (PF) 1 % IJ SOLN
INTRAMUSCULAR | Status: AC
  Administered 2014-10-12: 1 mL
  Filled 2014-10-12: qty 5

## 2014-10-12 MED ORDER — CEFTRIAXONE SODIUM 250 MG IJ SOLR
250.0000 mg | Freq: Once | INTRAMUSCULAR | Status: AC
  Administered 2014-10-12: 250 mg via INTRAMUSCULAR
  Filled 2014-10-12: qty 250

## 2014-10-12 MED ORDER — AZITHROMYCIN 250 MG PO TABS
1000.0000 mg | ORAL_TABLET | Freq: Once | ORAL | Status: AC
  Administered 2014-10-12: 1000 mg via ORAL
  Filled 2014-10-12: qty 4

## 2014-10-12 NOTE — Discharge Instructions (Signed)
Your workup today does not show a specific cause for your upper abdominal pain.  Your pelvic exam was concerning for a sexually transmitted disease.  Today, you have been tested for HIV, syphilis, gonorrhea and chlamydia.  You were treated for gonorrhea and Chlamydia.  These tests will all take several days to return.  Follow-up with the health department or your gynecologist for recheck to make sure the infection has cleared.  Abstain from sex until UA your partner have been tested and treated.   Abdominal Pain Many things can cause abdominal pain. Usually, abdominal pain is not caused by a disease and will improve without treatment. It can often be observed and treated at home. Your health care provider will do a physical exam and possibly order blood tests and X-rays to help determine the seriousness of your pain. However, in many cases, more time must pass before a clear cause of the pain can be found. Before that point, your health care provider may not know if you need more testing or further treatment. HOME CARE INSTRUCTIONS  Monitor your abdominal pain for any changes. The following actions may help to alleviate any discomfort you are experiencing:  Only take over-the-counter or prescription medicines as directed by your health care provider.  Do not take laxatives unless directed to do so by your health care provider.  Try a clear liquid diet (broth, tea, or water) as directed by your health care provider. Slowly move to a bland diet as tolerated. SEEK MEDICAL CARE IF:  You have unexplained abdominal pain.  You have abdominal pain associated with nausea or diarrhea.  You have pain when you urinate or have a bowel movement.  You experience abdominal pain that wakes you in the night.  You have abdominal pain that is worsened or improved by eating food.  You have abdominal pain that is worsened with eating fatty foods.  You have a fever. SEEK IMMEDIATE MEDICAL CARE IF:   Your pain  does not go away within 2 hours.  You keep throwing up (vomiting).  Your pain is felt only in portions of the abdomen, such as the right side or the left lower portion of the abdomen.  You pass bloody or black tarry stools. MAKE SURE YOU:  Understand these instructions.   Will watch your condition.   Will get help right away if you are not doing well or get worse.  Document Released: 02/21/2005 Document Revised: 05/19/2013 Document Reviewed: 01/21/2013 [facility] Patient Information 2015 [place], [place]. This information is not intended to replace advice given to you by your health care provider. Make sure you discuss any questions you have with your health care provider.   Sexually Transmitted Disease A sexually transmitted disease (STD) is a disease or infection often passed to another person during sex. However, STDs can be passed through nonsexual ways. An STD can be passed through:  Spit (saliva).  Semen.  Blood.  Mucus from the vagina.  Pee (urine). HOW CAN I LESSEN MY CHANCES OF GETTING AN STD?  Use:  Latex condoms.  Water-soluble lubricants with condoms. Do not use petroleum jelly or oils.  Dental dams. These are small pieces of latex that are used as a barrier during oral sex.  Avoid having more than one sex partner.  Do not have sex with someone who has other sex partners.  Do not have sex with anyone you do not know or who is at high risk for an STD.  Avoid risky sex that can  break your skin.  Do not have sex if you have open sores on your mouth or skin.  Avoid drinking too much alcohol or taking illegal drugs. Alcohol and drugs can affect your good judgment.  Avoid oral and anal sex acts.  Get shots (vaccines) for HPV and hepatitis.  If you are at risk of being infected with HIV, it is advised that you take a certain medicine daily to prevent HIV infection. This is called pre-exposure prophylaxis (PrEP). You may be at risk if:  You are a man who  has sex with other men (MSM).  You are attracted to the opposite sex (heterosexual) and are having sex with more than one partner.  You take drugs with a needle.  You have sex with someone who has HIV.  Talk with your doctor about if you are at high risk of being infected with HIV. If you begin to take PrEP, get tested for HIV first. Get tested every 3 months for as long as you are taking PrEP. WHAT SHOULD I DO IF I THINK I HAVE AN STD?  See your doctor.  Tell your sex partner(s) that you have an STD. They should be tested and treated.  Do not have sex until your doctor says it is okay. WHEN SHOULD I GET HELP? Get help right away if:  You have bad belly (abdominal) pain.  You are a man and have puffiness (swelling) or pain in your testicles.  You are a woman and have puffiness in your vagina. Document Released: 06/21/2004 Document Revised: 05/19/2013 Document Reviewed: 11/07/2012 [facility] Patient Information 2015 [place], [place]. This information is not intended to replace advice given to you by your health care provider. Make sure you discuss any questions you have with your health care provider.

## 2014-10-12 NOTE — ED Provider Notes (Signed)
CSN: [id number]     Arrival date & time 10/11/14  2142 History   First MD Initiated Contact with Patient 10/12/14 0144     Chief Complaint  Patient presents with  . Abdominal Pain  . Vaginal Discharge     (Consider location/radiation/quality/duration/timing/severity/associated sxs/prior Treatment) HPI 19 year old female presents to emergency department with complaint of upper abdominal pain and vaginal discharge.  Symptoms of an ongoing for about a week.  She denies new sexual partners, but has had unprotected sex.  She has history of prior Chlamydia infections.  Patient reports upper abdominal pain is dull, but occasionally is sharp and stabbing.  She is not having any upper abdominal pain this time.  Pain is usually worse after eating.  She denies any lower abdominal pain.  Last menstrual period was 2 weeks ago.  She denies any urinary symptoms.  Patient requesting STD shot. Past Medical History  Diagnosis Date  . Ganglion cyst of wrist     left  . Yeast infection 04/07/2013  . Anemia   . Chlamydia 07/06/14  . Chlamydia infection during pregnancy, antepartum 07/27/2014    Had +CHL 2/9 treated in hospital 2/19 will check POT 3/1  . Pelvic pain in female 07/27/2014  . Screening for STD (sexually transmitted disease) 07/27/2014  . Elevated BP 07/27/2014  . Chlamydia infection 08/02/2014   Past Surgical History  Procedure Laterality Date  . No past surgeries     Family History  Problem Relation Age of Onset  . Diabetes Maternal Grandmother   . Diabetes Paternal Grandmother   . Multiple sclerosis Cousin    History  Substance Use Topics  . Smoking status: Never Smoker   . Smokeless tobacco: Never Used  . Alcohol Use: No   OB History    Gravida Para Term Preterm AB TAB SAB Ectopic Multiple Living   2 2 2       0 2     Review of Systems   See History of Present Illness; otherwise all other systems are reviewed and negative  Allergies  Review of patient's allergies indicates no known  allergies.  Home Medications   Prior to Admission medications   Medication Sig Start Date End Date Taking? Authorizing Provider  ibuprofen (ADVIL,MOTRIN) 200 MG tablet Take 400 mg by mouth every 8 (eight) hours as needed (for painl).   Yes Historical Provider, MD  azithromycin (ZITHROMAX) 500 MG tablet Take 2 po now 08/25/14   Cheral MarkerKimberly R Booker, CNM  hydrochlorothiazide (MICROZIDE) 12.5 MG capsule Take 1 capsule (12.5 mg total) by mouth daily. Patient not taking: Reported on 08/23/2014 08/02/14   Adline PotterJennifer A Griffin, NP   BP 125/87 mmHg  Pulse 74  Temp(Src) 98 F (36.7 C) (Oral)  Resp 20  SpO2 100%  LMP 10/01/2014 (Approximate) Physical Exam  Constitutional: She is oriented to person, place, and time. She appears well-developed and well-nourished.  HENT:  Head: Normocephalic and atraumatic.  Nose: Nose normal.  Mouth/Throat: Oropharynx is clear and moist.  Eyes: Conjunctivae and EOM are normal. Pupils are equal, round, and reactive to light.  Neck: Normal range of motion. Neck supple. No JVD present. No tracheal deviation present. No thyromegaly present.  Cardiovascular: Normal rate, regular rhythm, normal heart sounds and intact distal pulses.  Exam reveals no gallop and no friction rub.   No murmur heard. Pulmonary/Chest: Effort normal and breath sounds normal. No stridor. No respiratory distress. She has no wheezes. She has no rales. She exhibits no tenderness.  Abdominal: Soft. Bowel  sounds are normal. She exhibits no distension and no mass. There is no tenderness. There is no rebound and no guarding.  Genitourinary:  External genitalia within normal limits Vagina copious amounts of yellow-green discharge Cervix  normal negative for cervical motion tenderness Adnexa palpated, no masses and negative for tenderness noted Bladder palpated negative for tenderness Uterus palpated no masses and negative for tenderness    Musculoskeletal: Normal range of motion. She exhibits no edema  or tenderness.  Lymphadenopathy:    She has no cervical adenopathy.  Neurological: She is alert and oriented to person, place, and time. She displays normal reflexes. She exhibits normal muscle tone. Coordination normal.  Skin: Skin is warm and dry. No rash noted. No erythema. No pallor.  Psychiatric: She has a normal mood and affect. Her behavior is normal. Judgment and thought content normal.  Nursing note and vitals reviewed.   ED Course  Procedures (including critical care time) Labs Review Labs Reviewed  WET PREP, GENITAL - Abnormal; Notable for the following:    Clue Cells Wet Prep HPF POC FEW (*)    WBC, Wet Prep HPF POC TOO NUMEROUS TO COUNT (*)    All other components within normal limits  CBC WITH DIFFERENTIAL/PLATELET - Abnormal; Notable for the following:    WBC 10.7 (*)    MCV 75.2 (*)    MCH 24.8 (*)    RDW 18.2 (*)    All other components within normal limits  COMPREHENSIVE METABOLIC PANEL - Abnormal; Notable for the following:    AST 13 (*)    ALT 10 (*)    All other components within normal limits  URINALYSIS, ROUTINE W REFLEX MICROSCOPIC - Abnormal; Notable for the following:    APPearance CLOUDY (*)    Leukocytes, UA MODERATE (*)    All other components within normal limits  URINE MICROSCOPIC-ADD ON - Abnormal; Notable for the following:    Squamous Epithelial / LPF FEW (*)    All other components within normal limits  HIV ANTIBODY (ROUTINE TESTING)  RPR  POC URINE PREG, ED  GC/CHLAMYDIA PROBE AMP ([place])    Imaging Review No results found.   EKG Interpretation None      MDM   Final diagnoses:  Vaginal discharge  STI (sexually transmitted infection)  Pain of upper abdomen    19 year old female with a week of upper abdominal pain.  No specific findings on physical exam or on lab work.  Patient struck to follow up with her primary care Dr. for further evaluation.  Patient does have copious vaginal discharge.  No signs of yeast, no clue  cells.  Patient treated with Rocephin and Zithromax for presumed STI.    Marisa Severinlga Ayaka Andes, MD 10/12/14 [phone]

## 2014-10-13 ENCOUNTER — Telehealth (HOSPITAL_BASED_OUTPATIENT_CLINIC_OR_DEPARTMENT_OTHER): Payer: Self-pay | Admitting: Emergency Medicine

## 2014-10-13 NOTE — Telephone Encounter (Signed)
Notified of + chlamydia, educated re abstinence and notification of sexual partner(s) 

## 2014-12-23 ENCOUNTER — Encounter: Payer: Self-pay | Admitting: *Deleted

## 2014-12-29 ENCOUNTER — Encounter: Payer: Medicaid Other | Admitting: Adult Health

## 2014-12-29 ENCOUNTER — Encounter: Payer: Self-pay | Admitting: Adult Health

## 2014-12-29 ENCOUNTER — Ambulatory Visit (INDEPENDENT_AMBULATORY_CARE_PROVIDER_SITE_OTHER): Payer: Medicaid Other | Admitting: Adult Health

## 2014-12-29 VITALS — BP 120/62 | HR 80 | Temp 98.6°F | Ht 63.0 in | Wt 163.0 lb

## 2014-12-29 DIAGNOSIS — J321 Chronic frontal sinusitis: Secondary | ICD-10-CM | POA: Diagnosis not present

## 2014-12-29 DIAGNOSIS — O3680X0 Pregnancy with inconclusive fetal viability, not applicable or unspecified: Secondary | ICD-10-CM

## 2014-12-29 DIAGNOSIS — N898 Other specified noninflammatory disorders of vagina: Secondary | ICD-10-CM

## 2014-12-29 DIAGNOSIS — O09899 Supervision of other high risk pregnancies, unspecified trimester: Secondary | ICD-10-CM

## 2014-12-29 DIAGNOSIS — Z3201 Encounter for pregnancy test, result positive: Secondary | ICD-10-CM | POA: Diagnosis not present

## 2014-12-29 DIAGNOSIS — J329 Chronic sinusitis, unspecified: Secondary | ICD-10-CM | POA: Insufficient documentation

## 2014-12-29 DIAGNOSIS — Z3490 Encounter for supervision of normal pregnancy, unspecified, unspecified trimester: Secondary | ICD-10-CM

## 2014-12-29 HISTORY — DX: Chronic sinusitis, unspecified: J32.9

## 2014-12-29 HISTORY — DX: Other specified noninflammatory disorders of vagina: N89.8

## 2014-12-29 HISTORY — DX: Encounter for supervision of normal pregnancy, unspecified, unspecified trimester: Z34.90

## 2014-12-29 LAB — POCT WET PREP (WET MOUNT): WBC, Wet Prep HPF POC: POSITIVE

## 2014-12-29 LAB — POCT URINE PREGNANCY: Preg Test, Ur: POSITIVE — AB

## 2014-12-29 MED ORDER — AZITHROMYCIN 250 MG PO TABS: ORAL_TABLET | ORAL | Status: DC

## 2014-12-29 NOTE — Progress Notes (Signed)
Subjective:     Patient ID: Erica [name], female   DOB: 18-May-1996, 19 y.o.   MRN: [id number]  HPI Erica [name] is a 19 year old black female, married in for UPT has missed a period and has vaginal discharge and headache with congestion.She has 21 year old and 10 month old girls at home.She requests Birth control in hospital after delivery.  Review of Systems Patient denies any  hearing loss, fatigue, blurred vision, shortness of breath, chest pain, abdominal pain, problems with bowel movements, urination, or intercourse. No joint pain or mood swings.See HPI for positives.  Reviewed past medical,surgical, social and family history. Reviewed medications and allergies.     Objective:   Physical Exam BP 120/62 mmHg  Pulse 80  Temp(Src) 98.6 F (37 C)  Ht  (1.6 m)  Wt 163 lb (73.936 kg)  BMI 28.88 kg/m2  LMP 11/17/2014  Breastfeeding? No UPT+, about 6 weeks with EDD 08/24/15, medicaid form given, Skin warm and dry.Pelvic: external genitalia is normal in appearance no lesions, vagina: white discharge without odor,urethra has no lesions or masses noted, cervix:smooth and bulbous, uterus: normal size, shape and contour, non tender, no masses felt, adnexa: no masses or tenderness noted. Bladder is non tender and no masses felt. Wet prep:  +WBCs.Has tenderness right frontal sinus and a little in maxillary, no ethmoid, throat has no redness, and ears are clear.     Assessment:     Pregnant, short interval between pregnancies Sinus infection Vaginal discharge    Plan:     Take OTC prenatal gummies or flintstones   Return in 1 week for dating [place] Rx Z pack take 2 now and 1 daily for 4 days Review handout on first trimester Push fluids, ok to take tylenol

## 2014-12-29 NOTE — Patient Instructions (Signed)
First Trimester of Pregnancy The first trimester of pregnancy is from week 1 until the end of week 12 (months 1 through 3). A week after a sperm fertilizes an egg, the egg will implant on the wall of the uterus. This embryo will begin to develop into a baby. Genes from you and your partner are forming the baby. The female genes determine whether the baby is a boy or a girl. At 6-8 weeks, the eyes and face are formed, and the heartbeat can be seen on ultrasound. At the end of 12 weeks, all the baby's organs are formed.  Now that you are pregnant, you will want to do everything you can to have a healthy baby. Two of the most important things are to get good prenatal care and to follow your health care provider's instructions. Prenatal care is all the medical care you receive before the baby's birth. This care will help prevent, find, and treat any problems during the pregnancy and childbirth. BODY CHANGES Your body goes through many changes during pregnancy. The changes vary from woman to woman.   You may gain or lose a couple of pounds at first.  You may feel sick to your stomach (nauseous) and throw up (vomit). If the vomiting is uncontrollable, call your health care provider.  You may tire easily.  You may develop headaches that can be relieved by medicines approved by your health care provider.  You may urinate more often. Painful urination may mean you have a bladder infection.  You may develop heartburn as a result of your pregnancy.  You may develop constipation because certain hormones are causing the muscles that push waste through your intestines to slow down.  You may develop hemorrhoids or swollen, bulging veins (varicose veins).  Your breasts may begin to grow larger and become tender. Your nipples may stick out more, and the tissue that surrounds them (areola) may become darker.  Your gums may bleed and may be sensitive to brushing and flossing.  Dark spots or blotches (chloasma,  mask of pregnancy) may develop on your face. This will likely fade after the baby is born.  Your menstrual periods will stop.  You may have a loss of appetite.  You may develop cravings for certain kinds of food.  You may have changes in your emotions from day to day, such as being excited to be pregnant or being concerned that something may go wrong with the pregnancy and baby.  You may have more vivid and strange dreams.  You may have changes in your hair. These can include thickening of your hair, rapid growth, and changes in texture. Some women also have hair loss during or after pregnancy, or hair that feels dry or thin. Your hair will most likely return to normal after your baby is born. WHAT TO EXPECT AT YOUR PRENATAL VISITS During a routine prenatal visit:  You will be weighed to make sure you and the baby are growing normally.  Your blood pressure will be taken.  Your abdomen will be measured to track your baby's growth.  The fetal heartbeat will be listened to starting around week 10 or 12 of your pregnancy.  Test results from any previous visits will be discussed. Your health care provider may ask you:  How you are feeling.  If you are feeling the baby move.  If you have had any abnormal symptoms, such as leaking fluid, bleeding, severe headaches, or abdominal cramping.  If you have any questions. Other tests   that may be performed during your first trimester include:  Blood tests to find your blood type and to check for the presence of any previous infections. They will also be used to check for low iron levels (anemia) and Rh antibodies. Later in the pregnancy, blood tests for diabetes will be done along with other tests if problems develop.  Urine tests to check for infections, diabetes, or protein in the urine.  An ultrasound to confirm the proper growth and development of the baby.  An amniocentesis to check for possible genetic problems.  Fetal screens for  spina bifida and Down syndrome.  You may need other tests to make sure you and the baby are doing well. HOME CARE INSTRUCTIONS  Medicines  Follow your health care provider's instructions regarding medicine use. Specific medicines may be either safe or unsafe to take during pregnancy.  Take your prenatal vitamins as directed.  If you develop constipation, try taking a stool softener if your health care provider approves. Diet  Eat regular, well-balanced meals. Choose a variety of foods, such as meat or vegetable-based protein, fish, milk and low-fat dairy products, vegetables, fruits, and whole grain breads and cereals. Your health care provider will help you determine the amount of weight gain that is right for you.  Avoid raw meat and uncooked cheese. These carry germs that can cause birth defects in the baby.  Eating four or five small meals rather than three large meals a day may help relieve nausea and vomiting. If you start to feel nauseous, eating a few soda crackers can be helpful. Drinking liquids between meals instead of during meals also seems to help nausea and vomiting.  If you develop constipation, eat more high-fiber foods, such as fresh vegetables or fruit and whole grains. Drink enough fluids to keep your urine clear or pale yellow. Activity and Exercise  Exercise only as directed by your health care provider. Exercising will help you:  Control your weight.  Stay in shape.  Be prepared for labor and delivery.  Experiencing pain or cramping in the lower abdomen or low back is a good sign that you should stop exercising. Check with your health care provider before continuing normal exercises.  Try to avoid standing for long periods of time. Move your legs often if you must stand in one place for a long time.  Avoid heavy lifting.  Wear low-heeled shoes, and practice good posture.  You may continue to have sex unless your health care provider directs you  otherwise. Relief of Pain or Discomfort  Wear a good support bra for breast tenderness.   Take warm sitz baths to soothe any pain or discomfort caused by hemorrhoids. Use hemorrhoid cream if your health care provider approves.   Rest with your legs elevated if you have leg cramps or low back pain.  If you develop varicose veins in your legs, wear support hose. Elevate your feet for 15 minutes, 3-4 times a day. Limit salt in your diet. Prenatal Care  Schedule your prenatal visits by the twelfth week of pregnancy. They are usually scheduled monthly at first, then more often in the last 2 months before delivery.  Write down your questions. Take them to your prenatal visits.  Keep all your prenatal visits as directed by your health care provider. Safety  Wear your seat belt at all times when driving.  Make a list of emergency phone numbers, including numbers for family, friends, the hospital, and police and fire departments. General Tips    Ask your health care provider for a referral to a local prenatal education class. Begin classes no later than at the beginning of month 6 of your pregnancy.  Ask for help if you have counseling or nutritional needs during pregnancy. Your health care provider can offer advice or refer you to specialists for help with various needs.  Do not use hot tubs, steam rooms, or saunas.  Do not douche or use tampons or scented sanitary pads.  Do not cross your legs for long periods of time.  Avoid cat litter boxes and soil used by cats. These carry germs that can cause birth defects in the baby and possibly loss of the fetus by miscarriage or stillbirth.  Avoid all smoking, herbs, alcohol, and medicines not prescribed by your health care provider. Chemicals in these affect the formation and growth of the baby.  Schedule a dentist appointment. At home, brush your teeth with a soft toothbrush and be gentle when you floss. SEEK MEDICAL CARE IF:   You have  dizziness.  You have mild pelvic cramps, pelvic pressure, or nagging pain in the abdominal area.  You have persistent nausea, vomiting, or diarrhea.  You have a bad smelling vaginal discharge.  You have pain with urination.  You notice increased swelling in your face, hands, legs, or ankles. SEEK IMMEDIATE MEDICAL CARE IF:   You have a fever.  You are leaking fluid from your vagina.  You have spotting or bleeding from your vagina.  You have severe abdominal cramping or pain.  You have rapid weight gain or loss.  You vomit blood or material that looks like coffee grounds.  You are exposed to [place] measles and have never had them.  You are exposed to fifth disease or chickenpox.  You develop a severe headache.  You have shortness of breath.  You have any kind of trauma, such as from a fall or a car accident. Document Released: 05/08/2001 Document Revised: 09/28/2013 Document Reviewed: 03/24/2013 [facility] Patient Information 2015 [place], [place]. This information is not intended to replace advice given to you by your health care provider. Make sure you discuss any questions you have with your health care provider. Take OTC PNV  Return in 1 week for [place]  Talk Z pack and push fluids Ok to tylenol

## 2015-01-04 ENCOUNTER — Encounter: Payer: Medicaid Other | Admitting: Women's Health

## 2015-01-05 ENCOUNTER — Ambulatory Visit (INDEPENDENT_AMBULATORY_CARE_PROVIDER_SITE_OTHER): Payer: Medicaid Other

## 2015-01-05 ENCOUNTER — Other Ambulatory Visit: Payer: Self-pay | Admitting: Adult Health

## 2015-01-05 DIAGNOSIS — O3680X0 Pregnancy with inconclusive fetal viability, not applicable or unspecified: Secondary | ICD-10-CM | POA: Diagnosis not present

## 2015-01-05 NOTE — Progress Notes (Signed)
[place] 6+1wks single IUP w/ys,pos fht 107bpm,normal ov' bilat,crl 4.23mm

## 2015-01-14 ENCOUNTER — Encounter (HOSPITAL_COMMUNITY): Payer: Self-pay | Admitting: Emergency Medicine

## 2015-01-14 DIAGNOSIS — Z8619 Personal history of other infectious and parasitic diseases: Secondary | ICD-10-CM | POA: Insufficient documentation

## 2015-01-14 DIAGNOSIS — J069 Acute upper respiratory infection, unspecified: Secondary | ICD-10-CM | POA: Insufficient documentation

## 2015-01-14 DIAGNOSIS — Z8742 Personal history of other diseases of the female genital tract: Secondary | ICD-10-CM | POA: Diagnosis not present

## 2015-01-14 DIAGNOSIS — Z862 Personal history of diseases of the blood and blood-forming organs and certain disorders involving the immune mechanism: Secondary | ICD-10-CM | POA: Diagnosis not present

## 2015-01-14 DIAGNOSIS — Z8739 Personal history of other diseases of the musculoskeletal system and connective tissue: Secondary | ICD-10-CM | POA: Diagnosis not present

## 2015-01-14 NOTE — ED Notes (Signed)
Patient complaining of sore throat x 3 days. Reports has had some congestion as well.

## 2015-01-15 ENCOUNTER — Emergency Department (HOSPITAL_COMMUNITY)
Admission: EM | Admit: 2015-01-15 | Discharge: 2015-01-15 | Disposition: A | Discharge status: Home / Self Care | Payer: Medicaid Other | Attending: Emergency Medicine | Admitting: Emergency Medicine

## 2015-01-15 DIAGNOSIS — J029 Acute pharyngitis, unspecified: Secondary | ICD-10-CM

## 2015-01-15 LAB — RAPID STREP SCREEN (MED CTR MEBANE ONLY): STREPTOCOCCUS, GROUP A SCREEN (DIRECT): NEGATIVE

## 2015-01-15 MED ORDER — ACETAMINOPHEN 325 MG PO TABS
650.0000 mg | ORAL_TABLET | Freq: Once | ORAL | Status: AC
  Administered 2015-01-15: 650 mg via ORAL
  Filled 2015-01-15: qty 2

## 2015-01-15 NOTE — ED Provider Notes (Signed)
CSN: [id number]     Arrival date & time 01/14/15  2351 History   First MD Initiated Contact with Patient 01/15/15 0001     Chief Complaint  Patient presents with  . Sore Throat     (Consider location/radiation/quality/duration/timing/severity/associated sxs/prior Treatment) Patient is a 19 y.o. female presenting with pharyngitis. The history is provided by the patient.  Sore Throat This is a new problem. Episode onset: 3 days ago. The problem occurs constantly. The problem has been unchanged. Associated symptoms include congestion, a sore throat and swollen glands. Pertinent negatives include no abdominal pain, chest pain, chills, coughing, fever, myalgias, nausea, neck pain or rash. The symptoms are aggravated by swallowing. She has tried acetaminophen (Pt completed a course of zithromax one week ago for acute sinusitis with improvement in sinus pain and discharge although still has some  nasal congestion.  denies post nasal drip.) for the symptoms. The treatment provided mild relief.    Past Medical History  Diagnosis Date  . Ganglion cyst of wrist     left  . Yeast infection 04/07/2013  . Anemia   . Chlamydia 07/06/14  . Chlamydia infection during pregnancy, antepartum 07/27/2014    Had +CHL 2/9 treated in hospital 2/19 will check POT 3/1  . Pelvic pain in female 07/27/2014  . Screening for STD (sexually transmitted disease) 07/27/2014  . Elevated BP 07/27/2014  . Chlamydia infection 08/02/2014  . Pregnant 12/29/2014  . Sinus infection 12/29/2014  . Vaginal discharge 12/29/2014   Past Surgical History  Procedure Laterality Date  . No past surgeries     Family History  Problem Relation Age of Onset  . Diabetes Maternal Grandmother   . Diabetes Paternal Grandmother   . Multiple sclerosis Cousin    Social History  Substance Use Topics  . Smoking status: Never Smoker   . Smokeless tobacco: Never Used  . Alcohol Use: No   OB History    Gravida Para Term Preterm AB TAB SAB Ectopic  Multiple Living   0 2     Review of Systems  Constitutional: Negative for fever and chills.  HENT: Positive for congestion, rhinorrhea and sore throat. Negative for ear pain, facial swelling, postnasal drip, sinus pressure, trouble swallowing and voice change.   Eyes: Negative for discharge.  Respiratory: Negative for cough, shortness of breath, wheezing and stridor.   Cardiovascular: Negative for chest pain.  Gastrointestinal: Negative for nausea and abdominal pain.  Genitourinary: Negative.   Musculoskeletal: Negative for myalgias and neck pain.  Skin: Negative for rash.      Allergies  Review of patient's allergies indicates no known allergies.  Home Medications   Prior to Admission medications   Medication Sig Start Date End Date Taking? Authorizing Provider  azithromycin (ZITHROMAX) 250 MG tablet Take 2 now and 1 daily for 4 days 12/29/14   Adline Potter, NP   BP 124/77 mmHg  Pulse 93  Temp(Src) 99.6 F (37.6 C) (Oral)  Resp 16  Ht  (1.6 m)  Wt 160 lb (72.576 kg)  BMI 28.35 kg/m2  SpO2 100%  LMP 11/17/2014 Physical Exam  Constitutional: She is oriented to person, place, and time. She appears well-developed and well-nourished.  HENT:  Head: Normocephalic and atraumatic.  Right Ear: Tympanic membrane and ear canal normal.  Left Ear: Tympanic membrane and ear canal normal.  Nose: No mucosal edema, rhinorrhea or sinus tenderness.  Mouth/Throat: Uvula is midline and mucous membranes are normal.  Oropharyngeal exudate and posterior oropharyngeal erythema present. No posterior oropharyngeal edema or tonsillar abscesses.  1+ bilateral tonsillar edema, erythema with scant traces of purulence in tonsillar crypts.  No petechiae.  No head or neck adenopathy.  Eyes: Conjunctivae are normal.  Cardiovascular: Normal rate and normal heart sounds.   Pulmonary/Chest: Effort normal. No respiratory distress. She has no wheezes. She has no rales.  Abdominal: Soft.  There is no tenderness.  Musculoskeletal: Normal range of motion.  Neurological: She is alert and oriented to person, place, and time.  Skin: Skin is warm and dry. No rash noted.  Psychiatric: She has a normal mood and affect.    ED Course  Procedures (including critical care time) Labs Review Labs Reviewed  RAPID STREP SCREEN (NOT AT [facility])  CULTURE, GROUP A STREP    Imaging Review No results found.  EKG Interpretation None      MDM   Final diagnoses:  Viral pharyngitis    Strep test negative, culture pending.  Suspect viral source of sx.  Pt in no distress. Encouraged tylenol/ salt water gargles for throat pain relief.  She is [redacted] weeks pregnant. Plan f/u with her pcp or her obgyn [facility] for any ongoing or persistent sx.)    Burgess Amor, PA-C 01/15/15 1429  Azalia Bilis, MD 01/16/15 [phone]

## 2015-01-15 NOTE — Discharge Instructions (Signed)
Sore Throat A sore throat is pain, burning, irritation, or scratchiness of the throat. There is often pain or tenderness when swallowing or talking. A sore throat may be accompanied by other symptoms, such as coughing, sneezing, fever, and swollen neck glands. A sore throat is often the first sign of another sickness, such as a cold, flu, strep throat, or mononucleosis (commonly known as mono). Most sore throats go away without medical treatment. CAUSES  The most common causes of a sore throat include:  A viral infection, such as a cold, flu, or mono.  A bacterial infection, such as strep throat, tonsillitis, or whooping cough.  Seasonal allergies.  Dryness in the air.  Irritants, such as smoke or pollution.  Gastroesophageal reflux disease (GERD). HOME CARE INSTRUCTIONS   Only take over-the-counter medicines as directed by your caregiver.  Drink enough fluids to keep your urine clear or pale yellow.  Rest as needed.  Try using throat sprays, lozenges, or sucking on hard candy to ease any pain (if older than 4 years or as directed).  Sip warm liquids, such as broth, herbal tea, or warm water with honey to relieve pain temporarily. You may also eat or drink cold or frozen liquids such as frozen ice pops.  Gargle with salt water (mix 1 tsp salt with 8 oz of water).  Do not smoke and avoid secondhand smoke.  Put a cool-mist humidifier in your bedroom at night to moisten the air. You can also turn on a hot shower and sit in the bathroom with the door closed for 5-10 minutes. SEEK IMMEDIATE MEDICAL CARE IF:  You have difficulty breathing.  You are unable to swallow fluids, soft foods, or your saliva.  You have increased swelling in the throat.  Your sore throat does not get better in 7 days.  You have nausea and vomiting.  You have a fever or persistent symptoms for more than 2-3 days.  You have a fever and your symptoms suddenly get worse. MAKE SURE YOU:   Understand  these instructions.  Will watch your condition.  Will get help right away if you are not doing well or get worse. Document Released: 06/21/2004 Document Revised: 04/30/2012 Document Reviewed: 01/20/2012 [facility] Patient Information 2015 [place], [place]. This information is not intended to replace advice given to you by your health care provider. Make sure you discuss any questions you have with your health care provider.  It is safe for you to take tylenol for your throat pain.  Warm salt water gargles can also be helpful.  Rest and make sure you are drinking plenty of fluids.  Your strep test is negative tonight, telling us this is a virus which should get better on its own without the need of antibiotics.

## 2015-01-17 ENCOUNTER — Telehealth: Payer: Self-pay | Admitting: Radiology

## 2015-01-17 LAB — CULTURE, GROUP A STREP: Strep A Culture: NEGATIVE

## 2015-01-19 ENCOUNTER — Ambulatory Visit (INDEPENDENT_AMBULATORY_CARE_PROVIDER_SITE_OTHER): Payer: Medicaid Other | Admitting: Women's Health

## 2015-01-19 ENCOUNTER — Encounter: Payer: Self-pay | Admitting: Women's Health

## 2015-01-19 VITALS — BP 104/58 | HR 68 | Wt 163.0 lb

## 2015-01-19 DIAGNOSIS — D573 Sickle-cell trait: Secondary | ICD-10-CM | POA: Diagnosis not present

## 2015-01-19 DIAGNOSIS — Z3490 Encounter for supervision of normal pregnancy, unspecified, unspecified trimester: Secondary | ICD-10-CM | POA: Insufficient documentation

## 2015-01-19 DIAGNOSIS — A749 Chlamydial infection, unspecified: Secondary | ICD-10-CM | POA: Diagnosis not present

## 2015-01-19 DIAGNOSIS — Z331 Pregnant state, incidental: Secondary | ICD-10-CM

## 2015-01-19 DIAGNOSIS — Z369 Encounter for antenatal screening, unspecified: Secondary | ICD-10-CM

## 2015-01-19 DIAGNOSIS — Z3682 Encounter for antenatal screening for nuchal translucency: Secondary | ICD-10-CM

## 2015-01-19 DIAGNOSIS — Z1389 Encounter for screening for other disorder: Secondary | ICD-10-CM | POA: Diagnosis not present

## 2015-01-19 DIAGNOSIS — Z3491 Encounter for supervision of normal pregnancy, unspecified, first trimester: Secondary | ICD-10-CM

## 2015-01-19 DIAGNOSIS — O09891 Supervision of other high risk pregnancies, first trimester: Secondary | ICD-10-CM

## 2015-01-19 DIAGNOSIS — Z0283 Encounter for blood-alcohol and blood-drug test: Secondary | ICD-10-CM

## 2015-01-19 LAB — POCT URINALYSIS DIPSTICK
Blood, UA: NEGATIVE
GLUCOSE UA: NEGATIVE
KETONES UA: NEGATIVE
Leukocytes, UA: NEGATIVE
NITRITE UA: NEGATIVE
PROTEIN UA: NEGATIVE

## 2015-01-20 NOTE — Progress Notes (Addendum)
Subjective:  Erica [name] is a 19 y.o. G60P2002 African American female at [redacted]w[redacted]d by 6wk u/s, being seen today for her first obstetrical visit.  Her obstetrical history is significant for term uncomplicated svb x 2, short interval pregnancy x 2- birth Jan 2015 then Feb 2016 .  Pregnancy history fully reviewed.  Patient reports some cramping. Denies vb, uti s/s, abnormal/malodorous vag d/c, or vulvovaginal itching/irritation.  BP 104/58 mmHg  Pulse 68  Wt 163 lb (73.936 kg)  LMP 11/17/2014  HISTORY: OB History  Gravida Para Term Preterm AB SAB TAB Ectopic Multiple Living  0 2    # Outcome Date GA Lbr Len/2nd Weight Sex Delivery Anes PTL Lv  3 Current           2 Term 07/16/14 [redacted]w[redacted]d 16:48 / 00:15 8 lb 7.4 oz (3.839 kg) F Vag-Spont EPI N Y  1 Term 06/27/13 [redacted]w[redacted]d 07:30 / 00:04 6 lb 13.4 oz (3.1 kg) F Vag-Spont None N Y     Past Medical History  Diagnosis Date  . Ganglion cyst of wrist     left  . Yeast infection 04/07/2013  . Anemia   . Chlamydia 07/06/14  . Chlamydia infection during pregnancy, antepartum 07/27/2014    Had +CHL 2/9 treated in hospital 2/19 will check POT 3/1  . Pelvic pain in female 07/27/2014  . Screening for STD (sexually transmitted disease) 07/27/2014  . Elevated BP 07/27/2014  . Chlamydia infection 08/02/2014  . Pregnant 12/29/2014  . Sinus infection 12/29/2014  . Vaginal discharge 12/29/2014   Past Surgical History  Procedure Laterality Date  . No past surgeries     Family History  Problem Relation Age of Onset  . Diabetes Maternal Grandmother   . Diabetes Paternal Grandmother   . Multiple sclerosis Cousin     Exam   System:     General: Well developed & nourished, no acute distress   Skin: Warm & dry, normal coloration and turgor, no rashes   Neurologic: Alert & oriented, normal mood   Cardiovascular: Regular rate & rhythm   Respiratory: Effort & rate normal, LCTAB, acyanotic   Abdomen: Soft, non tender   Extremities: normal strength, tone   Thin prep pap smear will need when turns 19yo 10/22   Assessment:   Pregnancy: [id number] Patient Active Problem List   Diagnosis Date Noted  . Supervision of normal pregnancy 01/19/2015    Priority: High  . Chlamydia 07/27/2014    Priority: High  . Short interval between pregnancies complicating pregnancy, antepartum 03/01/2014    Priority: High  . Sickle cell trait 03/17/2013    Priority: High  . Sinus infection 12/29/2014  . Vaginal discharge 12/29/2014  . Pelvic pain in female 07/27/2014  . Elevated BP 07/27/2014    [redacted]w[redacted]d G3P2002 New OB visit Short interval pregnancy x 2 [place] trait + FOB neg  Plan:  Initial labs drawn Continue prenatal vitamins Problem list reviewed and updated Reviewed n/v relief measures and warning s/s to report Reviewed recommended weight gain based on pre-gravid BMI Encouraged well-balanced diet Genetic Screening discussed Integrated Screen: requested Cystic fibrosis screening discussed results reviewed Ultrasound discussed; fetal survey: requested Follow up in 4 weeks for 1st it/nt and visit [facility] completed Urine cx q trimester d/t [place] trait + EPIC went down during visit, unable to give printed new ob info  Marge Duncans CNM, [facility] 01/20/2015 10:06 AM   Visit was on 01/19/15- couldn't chart until  01/20/15    

## 2015-01-22 LAB — GC/CHLAMYDIA PROBE AMP
CHLAMYDIA, DNA PROBE: NEGATIVE
Neisseria gonorrhoeae by PCR: NEGATIVE

## 2015-01-22 LAB — URINE CULTURE: ORGANISM ID, BACTERIA: NO GROWTH

## 2015-01-26 ENCOUNTER — Emergency Department (HOSPITAL_COMMUNITY)
Admission: EM | Admit: 2015-01-26 | Discharge: 2015-01-26 | Disposition: A | Discharge status: Home / Self Care | Payer: Medicaid Other | Attending: Emergency Medicine | Admitting: Emergency Medicine

## 2015-01-26 ENCOUNTER — Encounter (HOSPITAL_COMMUNITY): Payer: Self-pay | Admitting: Emergency Medicine

## 2015-01-26 DIAGNOSIS — O200 Threatened abortion: Secondary | ICD-10-CM

## 2015-01-26 DIAGNOSIS — Z8709 Personal history of other diseases of the respiratory system: Secondary | ICD-10-CM | POA: Diagnosis not present

## 2015-01-26 DIAGNOSIS — O99011 Anemia complicating pregnancy, first trimester: Secondary | ICD-10-CM | POA: Insufficient documentation

## 2015-01-26 DIAGNOSIS — Z8742 Personal history of other diseases of the female genital tract: Secondary | ICD-10-CM | POA: Diagnosis not present

## 2015-01-26 DIAGNOSIS — Z8619 Personal history of other infectious and parasitic diseases: Secondary | ICD-10-CM | POA: Diagnosis not present

## 2015-01-26 DIAGNOSIS — Z3A10 10 weeks gestation of pregnancy: Secondary | ICD-10-CM | POA: Diagnosis not present

## 2015-01-26 DIAGNOSIS — Z792 Long term (current) use of antibiotics: Secondary | ICD-10-CM | POA: Insufficient documentation

## 2015-01-26 DIAGNOSIS — O209 Hemorrhage in early pregnancy, unspecified: Secondary | ICD-10-CM | POA: Diagnosis present

## 2015-01-26 DIAGNOSIS — Z8739 Personal history of other diseases of the musculoskeletal system and connective tissue: Secondary | ICD-10-CM | POA: Diagnosis not present

## 2015-01-26 NOTE — Discharge Instructions (Signed)

## 2015-01-26 NOTE — ED Provider Notes (Signed)
CSN: [id number]     Arrival date & time 01/26/15  1710 History   First MD Initiated Contact with Patient 01/26/15 1745     Chief Complaint  Patient presents with  . Threatened Miscarriage     (Consider location/radiation/quality/duration/timing/severity/associated sxs/prior Treatment) Patient is a 19 y.o. female presenting with vaginal bleeding. The history is provided by the patient.  Vaginal Bleeding Quality:  Dark red Severity:  Moderate Onset quality:  Sudden Duration:  1 hour Timing:  Constant Progression:  Improving Chronicity:  New Number of pads used:  1 Possible pregnancy: yes   Context: spontaneously   Relieved by:  None tried Worsened by:  Nothing tried Ineffective treatments:  None tried Associated symptoms: abdominal pain and nausea    Erica [name] is a 19 y.o. [id number] @ [redacted]w[redacted]d gestation who presents to the ED with vaginal bleeding. Patient reports that approximately one hour prior to arrival to the ED she started bleeding about like a period. She complains of lower abdominal cramping.   Past Medical History  Diagnosis Date  . Ganglion cyst of wrist     left  . Yeast infection 04/07/2013  . Anemia   . Chlamydia 07/06/14  . Chlamydia infection during pregnancy, antepartum 07/27/2014    Had +CHL 2/9 treated in hospital 2/19 will check POT 3/1  . Pelvic pain in female 07/27/2014  . Screening for STD (sexually transmitted disease) 07/27/2014  . Elevated BP 07/27/2014  . Chlamydia infection 08/02/2014  . Pregnant 12/29/2014  . Sinus infection 12/29/2014  . Vaginal discharge 12/29/2014   Past Surgical History  Procedure Laterality Date  . No past surgeries     Family History  Problem Relation Age of Onset  . Diabetes Maternal Grandmother   . Diabetes Paternal Grandmother   . Multiple sclerosis Cousin    Social History  Substance Use Topics  . Smoking status: Never Smoker   . Smokeless tobacco: Never Used  . Alcohol Use: No   OB History    Gravida Para Term  Preterm AB TAB SAB Ectopic Multiple Living   0 2     Review of Systems  Gastrointestinal: Positive for nausea and abdominal pain.  Genitourinary: Positive for vaginal bleeding.  all other systems negative   Allergies  Review of patient's allergies indicates no known allergies.  Home Medications   Prior to Admission medications   Medication Sig Start Date End Date Taking? Authorizing Provider  acetaminophen (TYLENOL) 325 MG tablet Take 650 mg by mouth every 6 (six) hours as needed.    Historical Provider, MD  azithromycin (ZITHROMAX) 250 MG tablet Take 2 now and 1 daily for 4 days Patient not taking: Reported on 01/19/2015 12/29/14   Adline Potter, NP  Prenatal Vit-Fe Fumarate-FA (MULTIVITAMIN-PRENATAL) 27-0.8 MG TABS tablet Take 1 tablet by mouth daily at 12 noon.    Historical Provider, MD   BP 123/79 mmHg  Pulse 98  Temp(Src) 100 F (37.8 C) (Oral)  Resp 18  Ht  (1.6 m)  Wt 164 lb (74.39 kg)  BMI 29.06 kg/m2  SpO2 100%  LMP 11/17/2014 Physical Exam  Constitutional: She is oriented to person, place, and time. She appears well-developed and well-nourished.  HENT:  Head: Normocephalic.  Eyes: EOM are normal.  Neck: Neck supple.  Cardiovascular: Normal rate and regular rhythm.   Pulmonary/Chest: Effort normal and breath sounds normal.  Abdominal: Soft. Bowel sounds are normal. There is no tenderness.  Genitourinary:  External genitalia without lesions. Moderate blood vaginal vault. Cervix closed, long, no CMT, no adnexal tenderness or mass palpated. Uterus approximately 10 week size.   Musculoskeletal: Normal range of motion.  Neurological: She is alert and oriented to person, place, and time. No cranial nerve deficit.  Skin: Skin is warm and dry.  Psychiatric: She has a normal mood and affect. Her behavior is normal.  Nursing note and vitals reviewed.   ED Course  Procedures (  Review of patient OB records from [facility] and patient blood type  is O positive.   Dr. Emelda Fear in to do bedside ultrasound. Viable IUP, small SCH. Discussed with the patient findings and plan of care including follow up in the office.   MDM  19 y.o. female with vaginal bleeding that started approximately one hour prior to arrival to the ED. Stable for discharge without hemorrhage. Discussed with the patient threatened miscarriage and plan of care. She agrees with plan.    Final diagnoses:  Threatened abortion in early pregnancy       Janne Napoleon, NP 01/26/15 1915  Samuel Jester, DO 01/29/15 [id number]

## 2015-01-26 NOTE — ED Notes (Signed)
Pt states she is [redacted] weeks pregnant, bleeding started an hour ago, cramping and nausea

## 2015-01-26 NOTE — ED Notes (Signed)
Assisted Mayer Camel NP with pelvic exam.

## 2015-02-04 ENCOUNTER — Telehealth: Payer: Self-pay | Admitting: *Deleted

## 2015-02-04 NOTE — Telephone Encounter (Signed)
Pt c/o initial vaginal bleeding on 01/26/2015 saw Dr. Emelda Fear at [facility] ER and was viable IUP. Pt states spoke with Social Worker with [facility] Department due to continuous vaginal bleeding and was told to call our office to see if she can be seen prior to her 02/17/2015 appt for u/s and provider. Reviewed note in Epic from 01/26/2015 noted viable IUP and small SCH. Informed patient was reviewing provider note from ER on 01/26/2015 and pt states "will just keep her appt for 02/17/2015 and hung up."

## 2015-02-07 ENCOUNTER — Telehealth: Payer: Self-pay | Admitting: Women's Health

## 2015-02-07 NOTE — Telephone Encounter (Signed)
Pt states that she was seen at the ER on 01/26/15. Pt states that she is having cramping and having some spotting. Pt states that she has an appointment on the 22nd and just wants to know if she can be seen before then. Phone call was sent tol front office and an appointment was given.

## 2015-02-08 ENCOUNTER — Other Ambulatory Visit (INDEPENDENT_AMBULATORY_CARE_PROVIDER_SITE_OTHER): Payer: Medicaid Other

## 2015-02-08 ENCOUNTER — Ambulatory Visit (INDEPENDENT_AMBULATORY_CARE_PROVIDER_SITE_OTHER): Payer: Medicaid Other | Admitting: Women's Health

## 2015-02-08 ENCOUNTER — Encounter: Payer: Self-pay | Admitting: Women's Health

## 2015-02-08 VITALS — BP 114/62 | HR 64 | Wt 161.0 lb

## 2015-02-08 DIAGNOSIS — O039 Complete or unspecified spontaneous abortion without complication: Secondary | ICD-10-CM | POA: Diagnosis not present

## 2015-02-08 DIAGNOSIS — R109 Unspecified abdominal pain: Secondary | ICD-10-CM

## 2015-02-08 DIAGNOSIS — O9989 Other specified diseases and conditions complicating pregnancy, childbirth and the puerperium: Secondary | ICD-10-CM

## 2015-02-08 DIAGNOSIS — O209 Hemorrhage in early pregnancy, unspecified: Secondary | ICD-10-CM

## 2015-02-08 DIAGNOSIS — O4691 Antepartum hemorrhage, unspecified, first trimester: Secondary | ICD-10-CM

## 2015-02-08 DIAGNOSIS — Z331 Pregnant state, incidental: Secondary | ICD-10-CM

## 2015-02-08 DIAGNOSIS — Z1389 Encounter for screening for other disorder: Secondary | ICD-10-CM | POA: Diagnosis not present

## 2015-02-08 DIAGNOSIS — O26899 Other specified pregnancy related conditions, unspecified trimester: Secondary | ICD-10-CM

## 2015-02-08 LAB — POCT URINALYSIS DIPSTICK
GLUCOSE UA: NEGATIVE
Ketones, UA: NEGATIVE
Leukocytes, UA: NEGATIVE
NITRITE UA: NEGATIVE
Protein, UA: NEGATIVE
RBC UA: NEGATIVE

## 2015-02-08 NOTE — Progress Notes (Signed)
[place] 11wks by previous us,NO IUP seen on today's us,EEC 9 mm w/ a small amount of color flow,normal ov's bilat,pt was seen by [name] after ultrasound.

## 2015-02-08 NOTE — Progress Notes (Signed)
Work-in Low-risk OB appointment [id number] [redacted]w[redacted]d Estimated Date of Delivery: 08/30/15 BP 114/62 mmHg  Pulse 64  Wt 161 lb (73.029 kg)  LMP 11/17/2014  BP, weight, and urine reviewed.  Refer to obstetrical flow sheet for FH & FHR.  Went to ED 8/31 w/ vb about as heavy as a period with clots, there was a moderate amt of blood in vaginal vault, cx was closed. Bedside u/s by [facility] at that time revealed viable IUP w/ small SCH. Blood type from 2015 pregnancy O+. Forgot to go to [facility] after 1st ob visit this pregnancy to get PN1. Pt states bleeding continued then slowed to just brown spotting when wiping, cramping has subsided. Doesn't recall passing any tissue, but did have one episode where she felt something pop and went to br and had large blood clot.  Unable to obtain FHT w/ doppler, informal bs u/s reveals what appears to be an empty uterus. Worked in w/ Hospital doctor for further clarification. Formal u/s reveals empty uterus=completed SAB.  Reviewed miscarriage, doesn't desire another pregnancy at this time. Wants to get nexplanon. We have hers here in office from when we ordered it last pregnancy. Had sex this am. Will get quant hcg in am on 9/23 and come in pm for nexplanon insertion, abstinence until then.

## 2015-02-08 NOTE — Patient Instructions (Signed)
Etonogestrel implant What is this medicine? ETONOGESTREL (et oh noe JES trel) is a contraceptive (birth control) device. It is used to prevent pregnancy. It can be used for up to 3 years. This medicine may be used for other purposes; ask your health care provider or pharmacist if you have questions. COMMON BRAND NAME(S): Implanon, Nexplanon What should I tell my health care provider before I take this medicine? They need to know if you have any of these conditions: -abnormal vaginal bleeding -blood vessel disease or blood clots -cancer of the breast, cervix, or liver -depression -diabetes -gallbladder disease -headaches -heart disease or recent heart attack -high blood pressure -high cholesterol -kidney disease -liver disease -renal disease -seizures -tobacco smoker -an unusual or allergic reaction to etonogestrel, other hormones, anesthetics or antiseptics, medicines, foods, dyes, or preservatives -pregnant or trying to get pregnant -breast-feeding How should I use this medicine? This device is inserted just under the skin on the inner side of your upper arm by a health care professional. Talk to your pediatrician regarding the use of this medicine in children. Special care may be needed. Overdosage: If you think you've taken too much of this medicine contact a poison control center or emergency room at once. Overdosage: If you think you have taken too much of this medicine contact a poison control center or emergency room at once. NOTE: This medicine is only for you. Do not share this medicine with others. What if I miss a dose? This does not apply. What may interact with this medicine? Do not take this medicine with any of the following medications: -amprenavir -bosentan -fosamprenavir This medicine may also interact with the following medications: -barbiturate medicines for inducing sleep or treating seizures -certain medicines for fungal infections like ketoconazole and  itraconazole -griseofulvin -medicines to treat seizures like carbamazepine, felbamate, oxcarbazepine, phenytoin, topiramate -modafinil -phenylbutazone -rifampin -some medicines to treat HIV infection like atazanavir, indinavir, lopinavir, nelfinavir, tipranavir, ritonavir -St. John's wort This list may not describe all possible interactions. Give your health care provider a list of all the medicines, herbs, non-prescription drugs, or dietary supplements you use. Also tell them if you smoke, drink alcohol, or use illegal drugs. Some items may interact with your medicine. What should I watch for while using this medicine? This product does not protect you against HIV infection (AIDS) or other sexually transmitted diseases. You should be able to feel the implant by pressing your fingertips over the skin where it was inserted. Tell your doctor if you cannot feel the implant. What side effects may I notice from receiving this medicine? Side effects that you should report to your doctor or health care professional as soon as possible: -allergic reactions like skin rash, itching or hives, swelling of the face, lips, or tongue -breast lumps -changes in vision -confusion, trouble speaking or understanding -dark urine -depressed mood -general ill feeling or flu-like symptoms -light-colored stools -loss of appetite, nausea -right upper belly pain -severe headaches -severe pain, swelling, or tenderness in the abdomen -shortness of breath, chest pain, swelling in a leg -signs of pregnancy -sudden numbness or weakness of the face, arm or leg -trouble walking, dizziness, loss of balance or coordination -unusual vaginal bleeding, discharge -unusually weak or tired -yellowing of the eyes or skin Side effects that usually do not require medical attention (Report these to your doctor or health care professional if they continue or are bothersome.): -acne -breast pain -changes in  weight -cough -fever or chills -headache -irregular menstrual bleeding -itching, burning, and   vaginal discharge -pain or difficulty passing urine -sore throat This list may not describe all possible side effects. Call your doctor for medical advice about side effects. You may report side effects to FDA at 1-800-FDA-1088. Where should I keep my medicine? This drug is given in a hospital or clinic and will not be stored at home. NOTE: This sheet is a summary. It may not cover all possible information. If you have questions about this medicine, talk to your doctor, pharmacist, or health care provider.  2015, Elsevier/[employer] Standard. (2011-11-19 15:37:45)  

## 2015-02-17 ENCOUNTER — Encounter: Payer: Medicaid Other | Admitting: Advanced Practice Midwife

## 2015-02-17 ENCOUNTER — Other Ambulatory Visit: Payer: Medicaid Other

## 2015-02-22 ENCOUNTER — Other Ambulatory Visit: Payer: Medicaid Other

## 2015-02-22 ENCOUNTER — Encounter: Payer: Medicaid Other | Admitting: Women's Health

## 2015-02-22 DIAGNOSIS — Z3040 Encounter for surveillance of contraceptives, unspecified: Secondary | ICD-10-CM

## 2015-02-22 LAB — BETA HCG QUANT (REF LAB): HCG QUANT: 16 m[IU]/mL

## 2015-02-23 NOTE — Progress Notes (Signed)
  This encounter was created in error - please disregard. Pt came in for nexplanon insertion. Had SAB earlier in month and had sex 2wks later- no sex since, bhcg this am 16 so unsure if this is still elevated from recent sab or if it could possibly be an early pregnancy. Not comfortable putting nexplanon in today, need one more hcg to determine. To return thurs/fri this week. No sex until nexplanon insertion.  Cheral Marker, CNM, [facility] 02/22/15

## 2015-02-28 ENCOUNTER — Encounter: Payer: Medicaid Other | Admitting: Adult Health

## 2015-02-28 ENCOUNTER — Other Ambulatory Visit: Payer: Medicaid Other

## 2015-03-10 ENCOUNTER — Other Ambulatory Visit: Payer: Medicaid Other

## 2015-03-10 ENCOUNTER — Encounter: Payer: Medicaid Other | Admitting: Advanced Practice Midwife

## 2015-03-17 ENCOUNTER — Other Ambulatory Visit: Payer: Medicaid Other

## 2015-03-17 ENCOUNTER — Encounter: Payer: Self-pay | Admitting: Advanced Practice Midwife

## 2015-03-17 ENCOUNTER — Ambulatory Visit (INDEPENDENT_AMBULATORY_CARE_PROVIDER_SITE_OTHER): Payer: Medicaid Other | Admitting: Advanced Practice Midwife

## 2015-03-17 VITALS — BP 120/80 | HR 84 | Wt 165.3 lb

## 2015-03-17 DIAGNOSIS — Z30017 Encounter for initial prescription of implantable subdermal contraceptive: Secondary | ICD-10-CM

## 2015-03-17 DIAGNOSIS — Z3202 Encounter for pregnancy test, result negative: Secondary | ICD-10-CM | POA: Diagnosis not present

## 2015-03-17 DIAGNOSIS — Z3040 Encounter for surveillance of contraceptives, unspecified: Secondary | ICD-10-CM

## 2015-03-17 LAB — BETA HCG QUANT (REF LAB): hCG Quant: 2 m[IU]/mL

## 2015-03-17 LAB — POCT URINE PREGNANCY: Preg Test, Ur: NEGATIVE

## 2015-03-17 NOTE — Progress Notes (Signed)
  HPI:  Erica [name] is a 19 y.o. year old African American female here for Nexplanon insertion.  Her LMP was 10/12 , and her pregnancy test today was negative.  Risks/benefits/side effects of Nexplanon have been discussed and her questions have been answered.  Specifically, a failure rate of 05/998 has been reported, with an increased failure rate if pt takes St. John's Wort and/or antiseizure medicaitons.  Erica Kaufmannanielle T [name] is aware of the common side effect of irregular bleeding, which the incidence of decreases over time.   Past Medical History: Past Medical History  Diagnosis Date  . Ganglion cyst of wrist     left  . Yeast infection 04/07/2013  . Anemia   . Chlamydia 07/06/14  . Chlamydia infection during pregnancy, antepartum 07/27/2014    Had +CHL 2/9 treated in hospital 2/19 will check POT 3/1  . Pelvic pain in female 07/27/2014  . Screening for STD (sexually transmitted disease) 07/27/2014  . Elevated BP 07/27/2014  . Chlamydia infection 08/02/2014  . Pregnant 12/29/2014  . Sinus infection 12/29/2014  . Vaginal discharge 12/29/2014    Past Surgical History: Past Surgical History  Procedure Laterality Date  . No past surgeries      Family History: Family History  Problem Relation Age of Onset  . Diabetes Maternal Grandmother   . Diabetes Paternal Grandmother   . Multiple sclerosis Cousin     Social History: Social History  Substance Use Topics  . Smoking status: Never Smoker   . Smokeless tobacco: Never Used  . Alcohol Use: No    Allergies: No Known Allergies    Her left arm, approximatly 4 inches proximal from the elbow, was cleansed with alcohol and anesthetized with 2cc of 2% Lidocaine.  The area was cleansed again and the Nexplanon was inserted without difficulty.  A pressure bandage was applied.  Pt was instructed to remove pressure bandage in a few hours, and keep insertion site covered with a bandaid for 3 days.  Back up contraception was recommended for 2 weeks.   Follow-up scheduled PRN problems  CRESENZO-DISHMAN,Dawood Spitler 03/17/2015 4:25 PM

## 2015-05-05 ENCOUNTER — Encounter: Payer: Self-pay | Admitting: Obstetrics and Gynecology

## 2015-05-05 ENCOUNTER — Ambulatory Visit (INDEPENDENT_AMBULATORY_CARE_PROVIDER_SITE_OTHER): Payer: Medicaid Other | Admitting: Obstetrics and Gynecology

## 2015-05-05 VITALS — BP 110/60 | Ht 63.0 in | Wt 165.5 lb

## 2015-05-05 DIAGNOSIS — B373 Candidiasis of vulva and vagina: Secondary | ICD-10-CM

## 2015-05-05 DIAGNOSIS — N760 Acute vaginitis: Secondary | ICD-10-CM

## 2015-05-05 DIAGNOSIS — B3731 Acute candidiasis of vulva and vagina: Secondary | ICD-10-CM

## 2015-05-05 DIAGNOSIS — A499 Bacterial infection, unspecified: Secondary | ICD-10-CM

## 2015-05-05 DIAGNOSIS — B9689 Other specified bacterial agents as the cause of diseases classified elsewhere: Secondary | ICD-10-CM | POA: Insufficient documentation

## 2015-05-05 LAB — POCT WET PREP WITH KOH
CLUE CELLS WET PREP PER HPF POC: POSITIVE
KOH Prep POC: POSITIVE
Trichomonas, UA: NEGATIVE
YEAST WET PREP PER HPF POC: POSITIVE

## 2015-05-05 MED ORDER — METRONIDAZOLE 500 MG PO TABS: 500.0000 mg | ORAL_TABLET | Freq: Two times a day (BID) | ORAL | Status: DC

## 2015-05-05 MED ORDER — MICONAZOLE NITRATE 2 % VA CREA: 1.0000 | TOPICAL_CREAM | Freq: Every day | VAGINAL | Status: DC

## 2015-05-05 MED ORDER — FLUCONAZOLE 150 MG PO TABS: 150.0000 mg | ORAL_TABLET | Freq: Once | ORAL | Status: DC

## 2015-05-05 NOTE — Progress Notes (Signed)
Patient ID: Erica [name], female   DOB: 12-24-1995, 19 y.o.   MRN: [id number]   [facility]  Patient name: Erica [name] MRN [id number]  Date of birth: 12-24-1995  CC & HPI:  Erica [name] is a 19 y.o. female presenting today for intermittent, moderate, vaginal itching with associated white vaginal discharge for 4 days. She currently uses Nexplanon for [facility] and has intermittent spotting but denies any complaints with the Nexplanon. She tried OTC Monistat with no significant relief.   ROS:  10 Systems reviewed and all are negative for acute change except as noted in the HPI.  Pertinent History Reviewed:   Reviewed: Significant for recurrent yeast infections Medical         Past Medical History  Diagnosis Date  . Ganglion cyst of wrist     left  . Yeast infection 04/07/2013  . Anemia   . Chlamydia 07/06/14  . Chlamydia infection during pregnancy, antepartum 07/27/2014    Had +CHL 2/9 treated in hospital 2/19 will check POT 3/1  . Pelvic pain in female 07/27/2014  . Screening for STD (sexually transmitted disease) 07/27/2014  . Elevated BP 07/27/2014  . Chlamydia infection 08/02/2014  . Pregnant 12/29/2014  . Sinus infection 12/29/2014  . Vaginal discharge 12/29/2014                              Surgical Hx:    Past Surgical History  Procedure Laterality Date  . No past surgeries     Medications: Reviewed & Updated - see associated section                       Current outpatient prescriptions:  .  acetaminophen (TYLENOL) 325 MG tablet, Take 650 mg by mouth every 6 (six) hours as needed., Disp: , Rfl:    Social History: Reviewed -  reports that she has never smoked. She has never used smokeless tobacco.  Objective Findings:  Vitals: Blood pressure 110/60, height 5\' 3"  (1.6 m), weight 165 lb 8 oz (75.07 kg), not currently breastfeeding.  Physical Examination: General appearance - alert, well appearing, and in no distress Mental status - alert, oriented to person,  place, and time Pelvic - slight redness of vaginal entrance; heavy white discharge present. KOH and wet prep collected; cervix appears normal; Bimanual exam deferred Neurological - alert, oriented, normal speech, no focal findings or movement disorder noted Musculoskeletal - no joint tenderness, deformity or swelling Extremities - peripheral pulses normal, no pedal edema, no clubbing or cyanosis KOH  + yeast , wet prep + moderate clue cells  Assessment & Plan:   A:  1. Monilia vulvovaginitis 2  Subclinical BV + Whiff   P:  1. Rx Metronidazole  2 Rx Diflucan. 3 Monistat   By signing my name below, I, Jarvis Morganaylor Nature Kueker, attest that this documentation has been prepared under the direction and in the presence of Tilda BurrowJohn V. Zykiria Bruening, MD Electronically Signed: Jarvis Morganaylor Evangelyn Crouse, ED Scribe. 05/05/2015. 2:53 PM.  I personally performed the services described in this documentation, which was SCRIBED in my presence. The recorded information has been reviewed and considered accurate. It has been edited as necessary during review. Tilda BurrowFERGUSON,Jenisse Vullo V, MD

## 2015-05-05 NOTE — Progress Notes (Signed)
Patient ID: Erica KaufmannDanielle T [name], female   DOB: 10-15-1995, 19 y.o.   MRN: [id number] Pt here today for vaginal itching and discharge. Pt states that she has had these symptoms since Monday.

## 2015-05-05 NOTE — Patient Instructions (Signed)
Monilial Vaginitis Vaginitis in a soreness, swelling and redness (inflammation) of the vagina and vulva. Monilial vaginitis is not a sexually transmitted infection. CAUSES  Yeast vaginitis is caused by yeast (candida) that is normally found in your vagina. With a yeast infection, the candida has overgrown in number to a point that upsets the chemical balance. SYMPTOMS   White, thick vaginal discharge.  Swelling, itching, redness and irritation of the vagina and possibly the lips of the vagina (vulva).  Burning or painful urination.  Painful intercourse. DIAGNOSIS  Things that may contribute to monilial vaginitis are:  Postmenopausal and virginal states.  Pregnancy.  Infections.  Being tired, sick or stressed, especially if you had monilial vaginitis in the past.  Diabetes. Good control will help lower the chance.  Birth control pills.  Tight fitting garments.  Using bubble bath, feminine sprays, douches or deodorant tampons.  Taking certain medications that kill germs (antibiotics).  Sporadic recurrence can occur if you become ill. TREATMENT  Your caregiver will give you medication.  There are several kinds of anti monilial vaginal creams and suppositories specific for monilial vaginitis. For recurrent yeast infections, use a suppository or cream in the vagina 2 times a week, or as directed.  Anti-monilial or steroid cream for the itching or irritation of the vulva may also be used. Get your caregiver's permission.  Painting the vagina with methylene blue solution may help if the monilial cream does not work.  Eating yogurt may help prevent monilial vaginitis. HOME CARE INSTRUCTIONS   Finish all medication as prescribed.  Do not have sex until treatment is completed or after your caregiver tells you it is okay.  Take warm sitz baths.  Do not douche.  Do not use tampons, especially scented ones.  Wear cotton underwear.  Avoid tight pants and panty  hose.  Tell your sexual partner that you have a yeast infection. They should go to their caregiver if they have symptoms such as mild rash or itching.  Your sexual partner should be treated as well if your infection is difficult to eliminate.  Practice safer sex. Use condoms.  Some vaginal medications cause latex condoms to fail. Vaginal medications that harm condoms are:  Cleocin cream.  Butoconazole (Femstat).  Terconazole (Terazol) vaginal suppository.  Miconazole (Monistat) (may be purchased over the counter). SEEK MEDICAL CARE IF:   You have a temperature by mouth above 102 F (38.9 C).  The infection is getting worse after 2 days of treatment.  The infection is not getting better after 3 days of treatment.  You develop blisters in or around your vagina.  You develop vaginal bleeding, and it is not your menstrual period.  You have pain when you urinate.  You develop intestinal problems.  You have pain with sexual intercourse.   This information is not intended to replace advice given to you by your health care provider. Make sure you discuss any questions you have with your health care provider.   Document Released: 02/21/2005 Document Revised: 08/06/2011 Document Reviewed: 11/15/2014 Elsevier Interactive Patient Education 2016 Elsevier Inc. Bacterial Vaginosis Bacterial vaginosis is a vaginal infection that occurs when the normal balance of bacteria in the vagina is disrupted. It results from an overgrowth of certain bacteria. This is the most common vaginal infection in women of childbearing age. Treatment is important to prevent complications, especially in pregnant women, as it can cause a premature delivery. CAUSES  Bacterial vaginosis is caused by an increase in harmful bacteria that are normally present   in smaller amounts in the vagina. Several different kinds of bacteria can cause bacterial vaginosis. However, the reason that the condition develops is not  fully understood. RISK FACTORS Certain activities or behaviors can put you at an increased risk of developing bacterial vaginosis, including:  Having a new sex partner or multiple sex partners.  Douching.  Using an intrauterine device (IUD) for contraception. Women do not get bacterial vaginosis from toilet seats, bedding, swimming pools, or contact with objects around them. SIGNS AND SYMPTOMS  Some women with bacterial vaginosis have no signs or symptoms. Common symptoms include:  Grey vaginal discharge.  A fishlike odor with discharge, especially after sexual intercourse.  Itching or burning of the vagina and vulva.  Burning or pain with urination. DIAGNOSIS  Your health care provider will take a medical history and examine the vagina for signs of bacterial vaginosis. A sample of vaginal fluid may be taken. Your health care provider will look at this sample under a microscope to check for bacteria and abnormal cells. A vaginal pH test may also be done.  TREATMENT  Bacterial vaginosis may be treated with antibiotic medicines. These may be given in the form of a pill or a vaginal cream. A second round of antibiotics may be prescribed if the condition comes back after treatment. Because bacterial vaginosis increases your risk for sexually transmitted diseases, getting treated can help reduce your risk for chlamydia, gonorrhea, HIV, and herpes. HOME CARE INSTRUCTIONS   Only take over-the-counter or prescription medicines as directed by your health care provider.  If antibiotic medicine was prescribed, take it as directed. Make sure you finish it even if you start to feel better.  Tell all sexual partners that you have a vaginal infection. They should see their health care provider and be treated if they have problems, such as a mild rash or itching.  During treatment, it is important that you follow these instructions:  Avoid sexual activity or use condoms correctly.  Do not  douche.  Avoid alcohol as directed by your health care provider.  Avoid breastfeeding as directed by your health care provider. SEEK MEDICAL CARE IF:   Your symptoms are not improving after 3 days of treatment.  You have increased discharge or pain.  You have a fever. MAKE SURE YOU:   Understand these instructions.  Will watch your condition.  Will get help right away if you are not doing well or get worse. FOR MORE INFORMATION  Centers for Disease Control and Prevention, Division of STD Prevention: www.cdc.gov/std American Sexual Health Association (ASHA): www.ashastd.org    This information is not intended to replace advice given to you by your health care provider. Make sure you discuss any questions you have with your health care provider.   Document Released: 05/14/2005 Document Revised: 06/04/2014 Document Reviewed: 12/24/2012 Elsevier Interactive Patient Education 2016 Elsevier Inc.  

## 2015-06-01 ENCOUNTER — Telehealth: Payer: Self-pay | Admitting: Advanced Practice Midwife

## 2015-06-01 MED ORDER — MEGESTROL ACETATE 40 MG PO TABS: ORAL_TABLET | ORAL | Status: DC

## 2015-06-01 NOTE — Telephone Encounter (Signed)
Pt c/o vaginal bleeding with nexplanon ever since insertion 03/17/2015. Also, is there a birth control that doesn't cause weight gain?

## 2015-06-01 NOTE — Telephone Encounter (Signed)
I sent a rx for megace, but if she is gaining weight, she will have to get it removed if she can't control her hunger.  Only other BC that causes hunger is depo.  She may like an IUD.

## 2015-06-24 ENCOUNTER — Telehealth: Payer: Self-pay | Admitting: *Deleted

## 2015-06-24 MED ORDER — FLUCONAZOLE 150 MG PO TABS: ORAL_TABLET | ORAL | Status: DC

## 2015-06-24 NOTE — Telephone Encounter (Signed)
Has yeast infection is on antibiotics, wants diflcuan will rx

## 2015-06-24 NOTE — Telephone Encounter (Signed)
Pt states taking PCN due to having her wisdom teeth pulled, now has yeast infection. Requesting Diflucan.

## 2015-11-24 ENCOUNTER — Telehealth: Payer: Self-pay | Admitting: Advanced Practice Midwife

## 2015-11-24 ENCOUNTER — Other Ambulatory Visit: Payer: Self-pay | Admitting: Advanced Practice Midwife

## 2015-11-24 MED ORDER — FLUCONAZOLE 150 MG PO TABS: ORAL_TABLET | ORAL | Status: DC

## 2015-11-24 NOTE — Telephone Encounter (Signed)
I sent diflucan to pharmacy

## 2015-11-24 NOTE — Telephone Encounter (Signed)
Pt informed RX for Diflucan sent to pharmacy.

## 2015-12-01 ENCOUNTER — Encounter (HOSPITAL_COMMUNITY): Payer: Self-pay

## 2015-12-01 ENCOUNTER — Emergency Department (HOSPITAL_COMMUNITY)
Admission: EM | Admit: 2015-12-01 | Discharge: 2015-12-01 | Disposition: A | Discharge status: Home / Self Care | Payer: Medicaid Other | Attending: Emergency Medicine | Admitting: Emergency Medicine

## 2015-12-01 DIAGNOSIS — L739 Follicular disorder, unspecified: Secondary | ICD-10-CM | POA: Diagnosis not present

## 2015-12-01 DIAGNOSIS — R51 Headache: Secondary | ICD-10-CM | POA: Diagnosis not present

## 2015-12-01 DIAGNOSIS — K0889 Other specified disorders of teeth and supporting structures: Secondary | ICD-10-CM

## 2015-12-01 DIAGNOSIS — R519 Headache, unspecified: Secondary | ICD-10-CM

## 2015-12-01 HISTORY — DX: Streptococcal pharyngitis: J02.0

## 2015-12-01 MED ORDER — AMOXICILLIN 500 MG PO CAPS: 1000.0000 mg | ORAL_CAPSULE | Freq: Two times a day (BID) | ORAL | Status: DC

## 2015-12-01 MED ORDER — IBUPROFEN 800 MG PO TABS: 800.0000 mg | ORAL_TABLET | Freq: Three times a day (TID) | ORAL | Status: DC

## 2015-12-01 MED ORDER — MUPIROCIN CALCIUM 2 % EX CREA: 1.0000 "application " | TOPICAL_CREAM | Freq: Two times a day (BID) | CUTANEOUS | Status: DC

## 2015-12-01 NOTE — ED Notes (Signed)
Pt c/o intermittent L side/temporal area headache x 2 weeks and L lower dental pain x 2-3 days.  Pain score 8/10.  Pt reports head pain is radiating into L ear and L jaw.  Denies injury.  Denies blurred vision and light sensitivity.  Pt reports taking Advil w/o relief.

## 2015-12-01 NOTE — ED Notes (Signed)
Pt verbalized understanding of discharge instructions.  Ambulatory and independent at discharge. 

## 2015-12-01 NOTE — Discharge Instructions (Signed)
Dental Pain Dental pain may be caused by many things, including:  Tooth decay (cavities or caries). Cavities expose the nerve of your tooth to air and hot or cold temperatures. This can cause pain or discomfort.  Abscess or infection. A dental abscess is a collection of infected pus from a bacterial infection in the inner part of the tooth (pulp). It usually occurs at the end of the tooth's root.  Injury.  An unknown reason (idiopathic). Your pain may be mild or severe. It may only occur when:  You are chewing.  You are exposed to hot or cold temperature.  You are eating or drinking sugary foods or beverages, such as soda or candy. Your pain may also be constant. HOME CARE INSTRUCTIONS Watch your dental pain for any changes. The following actions may help to lessen any discomfort that you are feeling:  Take medicines only as directed by your dentist.  If you were prescribed an antibiotic medicine, finish all of it even if you start to feel better.  Keep all follow-up visits as directed by your dentist. This is important.  Do not apply heat to the outside of your face.  Rinse your mouth or gargle with salt water if directed by your dentist. This helps with pain and swelling.  You can make salt water by adding  tsp of salt to 1 cup of warm water.  Apply ice to the painful area of your face:  Put ice in a plastic bag.  Place a towel between your skin and the bag.  Leave the ice on for 20 minutes, 2-3 times per day.  Avoid foods or drinks that cause you pain, such as:  Very hot or very cold foods or drinks.  Sweet or sugary foods or drinks. SEEK MEDICAL CARE IF:  Your pain is not controlled with medicines.  Your symptoms are worse.  You have new symptoms. SEEK IMMEDIATE MEDICAL CARE IF:  You are unable to open your mouth.  You are having trouble breathing or swallowing.  You have a fever.  Your face, neck, or jaw is swollen.   This information is not  intended to replace advice given to you by your health care provider. Make sure you discuss any questions you have with your health care provider.   Document Released: 05/14/2005 Document Revised: 09/28/2014 Document Reviewed: 05/10/2014 Elsevier Interactive Patient Education 2016 Elsevier Inc.  Folliculitis Folliculitis is redness, soreness, and swelling (inflammation) of the hair follicles. This condition can occur anywhere on the body. People with weakened immune systems, diabetes, or obesity have a greater risk of getting folliculitis. CAUSES  Bacterial infection. This is the most common cause.  Fungal infection.  Viral infection.  Contact with certain chemicals, especially oils and tars. Long-term folliculitis can result from bacteria that live in the nostrils. The bacteria may trigger multiple outbreaks of folliculitis over time. SYMPTOMS Folliculitis most commonly occurs on the scalp, thighs, legs, back, buttocks, and areas where hair is shaved frequently. An early sign of folliculitis is a small, white or yellow, pus-filled, itchy lesion (pustule). These lesions appear on a red, inflamed follicle. They are usually less than 0.2 inches (5 mm) wide. When there is an infection of the follicle that goes deeper, it becomes a boil or furuncle. A group of closely packed boils creates a larger lesion (carbuncle). Carbuncles tend to occur in hairy, sweaty areas of the body. DIAGNOSIS  Your caregiver can usually tell what is wrong by doing a physical exam. A sample  may be taken from one of the lesions and tested in a lab. This can help determine what is causing your folliculitis. TREATMENT  Treatment may include:  Applying warm compresses to the affected areas.  Taking antibiotic medicines orally or applying them to the skin.  Draining the lesions if they contain a large amount of pus or fluid.  Laser hair removal for cases of long-lasting folliculitis. This helps to prevent regrowth of  the hair. HOME CARE INSTRUCTIONS  Apply warm compresses to the affected areas as directed by your caregiver.  If antibiotics are prescribed, take them as directed. Finish them even if you start to feel better.  You may take over-the-counter medicines to relieve itching.  Do not shave irritated skin.  Follow up with your caregiver as directed. SEEK IMMEDIATE MEDICAL CARE IF:   You have increasing redness, swelling, or pain in the affected area.  You have a fever. MAKE SURE YOU:  Understand these instructions.  Will watch your condition.  Will get help right away if you are not doing well or get worse.   General Headache Without Cause Your headache seems to be very local to the issue at your temple. Clean the skin twice daily with a mild soap, dry well and apply mupirocin ointment as prescribed. A headache is pain or discomfort felt around the head or neck area. The specific cause of a headache may not be found. There are many causes and types of headaches. A few common ones are:  Tension headaches.  Migraine headaches.  Cluster headaches.  Chronic daily headaches. HOME CARE INSTRUCTIONS  Watch your condition for any changes. Take these steps to help with your condition: Managing Pain  Take over-the-counter and prescription medicines only as told by your health care provider.  Lie down in a dark, quiet room when you have a headache.  If directed, apply ice to the head and neck area:  Put ice in a plastic bag.  Place a towel between your skin and the bag.  Leave the ice on for 20 minutes, 2-3 times per day.  Use a heating pad or hot shower to apply heat to the head and neck area as told by your health care provider.  Keep lights dim if bright lights bother you or make your headaches worse. Eating and Drinking  Eat meals on a regular schedule.  Limit alcohol use.  Decrease the amount of caffeine you drink, or stop drinking caffeine. General  Instructions  Keep all follow-up visits as told by your health care provider. This is important.  Keep a headache journal to help find out what may trigger your headaches. For example, write down:  What you eat and drink.  How much sleep you get.  Any change to your diet or medicines.  Try massage or other relaxation techniques.  Limit stress.  Sit up straight, and do not tense your muscles.  Do not use tobacco products, including cigarettes, chewing tobacco, or e-cigarettes. If you need help quitting, ask your health care provider.  Exercise regularly as told by your health care provider.  Sleep on a regular schedule. Get 7-9 hours of sleep, or the amount recommended by your health care provider. SEEK MEDICAL CARE IF:   Your symptoms are not helped by medicine.  You have a headache that is different from the usual headache.  You have nausea or you vomit.  You have a fever. SEEK IMMEDIATE MEDICAL CARE IF:   Your headache becomes severe.  You have  repeated vomiting.  You have a stiff neck.  You have a loss of vision.  You have problems with speech.  You have pain in the eye or ear.  You have muscular weakness or loss of muscle control.  You lose your balance or have trouble walking.  You feel faint or pass out.  You have confusion.   This information is not intended to replace advice given to you by your health care provider. Make sure you discuss any questions you have with your health care provider.   Document Released: 05/14/2005 Document Revised: 02/02/2015 Document Reviewed: 09/06/2014 Elsevier Interactive Patient Education [employer] Else[employer] Inc.

## 2015-12-01 NOTE — ED Provider Notes (Signed)
CSN: [id number]     Arrival date & time 12/01/15  1651 History   First MD Initiated Contact with Patient 12/01/15 1917     Chief Complaint  Patient presents with  . Headache  . Dental Pain     (Consider location/radiation/quality/duration/timing/severity/associated sxs/prior Treatment) HPI Patient reports 2 weeks of a headache at her left temple. She reports it is sharp in aching and pain. It does radiated towards her left ear and jaw. No associated fever, chills, neck stiffness, visual changes or earache. She reports she takes Advil and it temporarily improves a headache but then it comes back again. No nausea or vomiting. 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 She reports the headache preceded starting the job. She also notes now that she's got pain in her left lower jaw at one of her teeth. No history of frequent headaches. No medical history. Past Medical History  Diagnosis Date  . Ganglion cyst of wrist     left  . Yeast infection 04/07/2013  . Anemia   . Chlamydia 07/06/14  . Chlamydia infection during pregnancy, antepartum 07/27/2014    Had +CHL 2/9 treated in hospital 2/19 will check POT 3/1  . Pelvic pain in female 07/27/2014  . Screening for STD (sexually transmitted disease) 07/27/2014  . Elevated BP 07/27/2014  . Chlamydia infection 08/02/2014  . Pregnant 12/29/2014  . Sinus infection 12/29/2014  . Vaginal discharge 12/29/2014  . Strep throat    Past Surgical History  Procedure Laterality Date  . No past surgeries     Family History  Problem Relation Age of Onset  . Diabetes Maternal Grandmother   . Diabetes Paternal Grandmother   . Multiple sclerosis Cousin    Social History  Substance Use Topics  . Smoking status: Never Smoker   . Smokeless tobacco: Never Used  . Alcohol Use: No   OB History    Gravida Para Term Preterm AB TAB SAB Ectopic Multiple Living   3 2 2  1  1   0 2     Review of Systems  10 Systems reviewed and are negative  for acute change except as noted in the HPI.   Allergies  Review of patient's allergies indicates no known allergies.  Home Medications   Prior to Admission medications   Medication Sig Start Date End Date Taking? Authorizing Provider  acetaminophen (TYLENOL) 325 MG tablet Take 650 mg by mouth every 6 (six) hours as needed.    Historical Provider, MD  amoxicillin (AMOXIL) 500 MG capsule Take 2 capsules (1,000 mg total) by mouth 2 (two) times daily. 12/01/15   Arby BarretteMarcy Julianny Milstein, MD  fluconazole (DIFLUCAN) 150 MG tablet Take 1 now and repeat 1 in 3 days 06/24/15   Adline PotterJennifer A Griffin, NP  fluconazole (DIFLUCAN) 150 MG tablet 1 po stat; repeat in 3 days 11/24/15   Jacklyn ShellFrances Cresenzo-Dishmon, CNM  ibuprofen (ADVIL,MOTRIN) 800 MG tablet Take 1 tablet (800 mg total) by mouth 3 (three) times daily. 12/01/15   Arby BarretteMarcy Janos Shampine, MD  megestrol (MEGACE) 40 MG tablet Take 3/day (at the same time) for 5 days; 2/day for 5 days, then 1/day PO prn bleeding 06/01/15   Jacklyn ShellFrances Cresenzo-Dishmon, CNM  metroNIDAZOLE (FLAGYL) 500 MG tablet Take 1 tablet (500 mg total) by mouth 2 (two) times daily. 05/05/15   Tilda BurrowJohn V Ferguson, MD  miconazole (MONISTAT 7 SIMPLY CURE) 2 % vaginal cream Place 1 Applicatorful vaginally at bedtime. 05/05/15   Tilda BurrowJohn V Ferguson, MD  mupirocin cream (BACTROBAN) 2 % Apply 1 application topically 2 (two) times daily. 12/01/15   Arby BarretteMarcy Miamor Ayler, MD   BP 146/104 mmHg  Pulse 91  Temp(Src) 99.3 F (37.4 C) (Oral)  Resp 17  SpO2 100%  LMP 11/01/2015 Physical Exam  Constitutional: She is oriented to person, place, and time. She appears well-developed and well-nourished. No distress.  HENT:  Head: Normocephalic and atraumatic.  Patient has very localized tenderness to palpation along the left temple. No percussion tenderness over the sinuses. Patient does have just very minor folliculitis in the hairline on the left. No distinct cellulitis. Skin surrounding the ear is normal. No vesicular lesions. No facial  swelling. Bilateral TMs are normal. Ear canals are normal. Nares are normal. No bogginess of mucosa. Oral cavity, mucous membranes are pink and moist posterior oropharynx is widely patent without exudate or erythema of the tonsils. Dentition is in good condition with prior dental work. She does however have very focal tenderness to percussion over the left inferior first molar. No associated facial swelling or gingivitis.  Eyes: EOM are normal. Pupils are equal, round, and reactive to light.  Neck: Neck supple. No tracheal deviation present. No thyromegaly present.  Cardiovascular: Normal rate, regular rhythm, normal heart sounds and intact distal pulses.   Pulmonary/Chest: Effort normal and breath sounds normal.  Abdominal: Soft. Bowel sounds are normal. She exhibits no distension. There is no tenderness.  Musculoskeletal: Normal range of motion. She exhibits no edema.  Lymphadenopathy:    She has no cervical adenopathy.  Neurological: She is alert and oriented to person, place, and time. She has normal strength. Coordination normal. GCS eye subscore is 4. GCS verbal subscore is 5. GCS motor subscore is 6.  Skin: Skin is warm, dry and intact.  Psychiatric: She has a normal mood and affect.    ED Course  Procedures (including critical care time) Labs Review Labs Reviewed - No data to display  Imaging Review No results found. I have personally reviewed and evaluated these images and lab results as part of my medical decision-making.   EKG Interpretation None      MDM   Final diagnoses:  Acute nonintractable headache, unspecified headache type  Pain, dental  Folliculitis   Patient is well in appearance without associated neurologic or infectious symptoms. Pain is very reproducible to palpation along the left temple. Patient has a very minor folliculitis at this time, I suspect this may be the cause of her pain. Plan will be to treat for a folliculitis at the hairline and also treat  for possible apical abscess. Patient's dentition is however in good condition without signs of significant neglect. She does not have facial swelling, trismus or lymphadenopathy. Patient is instructed on signs and symptoms were to return and is to schedule follow-up appointments as an outpatient. Per prescription instructions are provided for mupirocin and amoxicillin.   Arby BarretteMarcy Shaima Sardinas, MD 12/01/15 2052

## 2015-12-28 ENCOUNTER — Ambulatory Visit (INDEPENDENT_AMBULATORY_CARE_PROVIDER_SITE_OTHER): Payer: Medicaid Other | Admitting: Women's Health

## 2015-12-28 ENCOUNTER — Encounter: Payer: Self-pay | Admitting: Women's Health

## 2015-12-28 VITALS — BP 110/80 | HR 88 | Wt 175.0 lb

## 2015-12-28 DIAGNOSIS — B373 Candidiasis of vulva and vagina: Secondary | ICD-10-CM

## 2015-12-28 DIAGNOSIS — N898 Other specified noninflammatory disorders of vagina: Secondary | ICD-10-CM

## 2015-12-28 DIAGNOSIS — B3731 Acute candidiasis of vulva and vagina: Secondary | ICD-10-CM

## 2015-12-28 DIAGNOSIS — R300 Dysuria: Secondary | ICD-10-CM | POA: Diagnosis not present

## 2015-12-28 LAB — POCT URINALYSIS DIPSTICK
Glucose, UA: NEGATIVE
KETONES UA: NEGATIVE
Nitrite, UA: NEGATIVE
PROTEIN UA: NEGATIVE

## 2015-12-28 LAB — POCT WET PREP (WET MOUNT): CLUE CELLS WET PREP WHIFF POC: NEGATIVE

## 2015-12-28 MED ORDER — FLUCONAZOLE 150 MG PO TABS: ORAL_TABLET | ORAL | 1 refills | Status: DC

## 2015-12-28 NOTE — Progress Notes (Signed)
   [facility] Visit  Patient name: Erica [name] MRN [id number]  Date of birth: [birth year]  CC & HPI:  Erica [name] is a 20 y.o. G47P2012 African American female presenting today for report of mild dysuria and mild frequency x 2d, thick white vag d/c w/ irritation x 1d. Recently on amoxicillin for dental infection. Decrease vaginal lubrication w/ nexplanon so started using otc lubrication for first time last week when had sex, and all sx started after this. Hasn't had pap since turning 19yo.  Patient's last menstrual period was 11/01/2015. The current method of family planning is nexplanon. Last pap never  Pertinent History Reviewed:  Medical & Surgical Hx:   Past medical, surgical, family, and social history reviewed in electronic medical record Medications: Reviewed & Updated - see associated section Allergies: Reviewed in electronic medical record  Objective Findings:  Vitals: BP 110/80 (BP Location: Right Arm, Patient Position: Sitting, Cuff Size: Normal)   Pulse 88   Wt 175 lb (79.4 kg)   LMP 11/01/2015   BMI 31.00 kg/m  Body mass index is 31 kg/m.  Physical Examination: General appearance - alert, well appearing, and in no distress Pelvic - cx clear, mod amt thick pinkish light brownish nonodorous clumpy d/c  Results for orders placed or performed in visit on 12/28/15 (from the past 24 hour(s))  POCT urinalysis dipstick   Collection Time: 12/28/15  4:17 PM  Result Value Ref Range   Color, UA     Clarity, UA     Glucose, UA neg    Bilirubin, UA     Ketones, UA neg    Spec Grav, UA     Blood, UA trace    pH, UA     Protein, UA neg    Urobilinogen, UA     Nitrite, UA neg    Leukocytes, UA small (1+) (A) Negative  POCT Wet Prep [name])   Collection Time: 12/28/15  5:09 PM  Result Value Ref Range   Source Wet Prep POC vaginal    WBC, Wet Prep HPF POC few    Bacteria Wet Prep HPF POC None None, Few, Too numerous to count   BACTERIA WET PREP  MORPHOLOGY POC     Clue Cells Wet Prep HPF POC None None, Too numerous to count   Clue Cells Wet Prep Whiff POC Negative Whiff    Yeast Wet Prep HPF POC Few    KOH Wet Prep POC     Trichomonas Wet Prep HPF POC  Absent     Assessment & Plan:  A:   Vaginal candida, recent antibiotic use  Dysuria  P:  Rx diflucan #2, take 1 now, 1 in 3d if needed  Will send urine cx and gc/ct  Try a water based lubricant or coconut oil/saliva/olive oil for vaginal lubrication  Return for will call to schedule pap & physical.  Marge Duncans CNM, [facility] 12/28/2015 5:09 PM

## 2015-12-29 ENCOUNTER — Ambulatory Visit: Payer: Medicaid Other | Admitting: Obstetrics & Gynecology

## 2015-12-30 ENCOUNTER — Telehealth: Payer: Self-pay | Admitting: Women's Health

## 2015-12-30 LAB — URINE CULTURE

## 2015-12-30 LAB — GC/CHLAMYDIA PROBE AMP
CHLAMYDIA, DNA PROBE: NEGATIVE
Neisseria gonorrhoeae by PCR: NEGATIVE

## 2015-12-30 MED ORDER — NITROFURANTOIN MONOHYD MACRO 100 MG PO CAPS: 100.0000 mg | ORAL_CAPSULE | Freq: Two times a day (BID) | ORAL | 0 refills | Status: DC

## 2015-12-30 NOTE — Telephone Encounter (Signed)
Pt informed Culture from 12/28/2015 + for UTI. Pt informed will route message to provider to get Rx for abx prescribed. Pt also informed GC/CHL negative.

## 2015-12-30 NOTE — Telephone Encounter (Signed)
Pt aware +UTI will rx macrobid 1 bid x 7 days Push fluids

## 2016-01-09 ENCOUNTER — Telehealth: Payer: Self-pay | Admitting: Women's Health

## 2016-01-09 MED ORDER — FLUCONAZOLE 150 MG PO TABS: ORAL_TABLET | ORAL | 1 refills | Status: DC

## 2016-01-09 MED ORDER — SULFAMETHOXAZOLE-TRIMETHOPRIM 800-160 MG PO TABS: 1.0000 | ORAL_TABLET | Freq: Two times a day (BID) | ORAL | 0 refills | Status: DC

## 2016-01-09 NOTE — Telephone Encounter (Signed)
Left message x 1. JSY 

## 2016-01-09 NOTE — Telephone Encounter (Signed)
Rx bactrim bid x 7d for persistent UTI sx (sensitive per culture report 8/2) and pt unable to come in for appt. Refilled diflucan rx as well. If sx persist after this round, will need to come in for appt.  Cheral MarkerKimberly R. Aymen Widrig, CNM, WHNP-BC 01/09/2016 2:20 PM

## 2016-01-09 NOTE — Telephone Encounter (Signed)
Spoke with pt letting her know Selena BattenKim advised she needs to be seen. 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 I spoke with Selena BattenKim and she advised she would order med but if she still has symptoms after this round of antibiotics, she would need to be seen. Pt voiced understanding. JSY

## 2016-01-09 NOTE — Telephone Encounter (Signed)
Pt called stating that she would like for kim to call in a medication for a UTI. Please contact pt

## 2016-01-09 NOTE — Telephone Encounter (Signed)
Spoke with pt. Pt was seen recently with pain with urination and a yeast infection. Pt states she has the same symptoms again. Can you order more meds? Thanks! JSY

## 2016-04-12 ENCOUNTER — Other Ambulatory Visit: Payer: Self-pay | Admitting: Advanced Practice Midwife

## 2016-04-12 ENCOUNTER — Telehealth: Payer: Self-pay | Admitting: *Deleted

## 2016-04-12 MED ORDER — FLUCONAZOLE 150 MG PO TABS: ORAL_TABLET | ORAL | 1 refills | Status: DC

## 2016-04-12 NOTE — Progress Notes (Signed)
rx sent

## 2016-04-12 NOTE — Telephone Encounter (Signed)
rx sent

## 2016-04-12 NOTE — Telephone Encounter (Signed)
Patient called stating she has a yeast infection-itching, odor;has had them before. She has scheduled an appt in 2 weeks (that was the earliest she could get)for a pap smear but wants a prescription for Diflucan sent to her pharmacy if possible. Please advise.

## 2016-04-12 NOTE — Telephone Encounter (Signed)
Called patient to let her know prescription was sent electronically to pharmacy

## 2016-04-26 ENCOUNTER — Ambulatory Visit (INDEPENDENT_AMBULATORY_CARE_PROVIDER_SITE_OTHER): Payer: Medicaid Other | Admitting: Women's Health

## 2016-04-26 ENCOUNTER — Other Ambulatory Visit (HOSPITAL_COMMUNITY)
Admission: RE | Admit: 2016-04-26 | Discharge: 2016-04-26 | Disposition: A | Discharge status: Home / Self Care | Payer: 59 | Source: Ambulatory Visit | Attending: Obstetrics & Gynecology | Admitting: Obstetrics & Gynecology

## 2016-04-26 ENCOUNTER — Encounter: Payer: Self-pay | Admitting: Women's Health

## 2016-04-26 VITALS — BP 120/80 | HR 104 | Ht 63.0 in | Wt 176.0 lb

## 2016-04-26 DIAGNOSIS — N898 Other specified noninflammatory disorders of vagina: Secondary | ICD-10-CM | POA: Diagnosis not present

## 2016-04-26 DIAGNOSIS — Z01419 Encounter for gynecological examination (general) (routine) without abnormal findings: Secondary | ICD-10-CM | POA: Diagnosis not present

## 2016-04-26 DIAGNOSIS — Z113 Encounter for screening for infections with a predominantly sexual mode of transmission: Secondary | ICD-10-CM | POA: Diagnosis present

## 2016-04-26 DIAGNOSIS — Z0000 Encounter for general adult medical examination without abnormal findings: Secondary | ICD-10-CM

## 2016-04-26 LAB — POCT WET PREP (WET MOUNT)
CLUE CELLS WET PREP WHIFF POC: POSITIVE
Trichomonas Wet Prep HPF POC: ABSENT

## 2016-04-26 MED ORDER — METRONIDAZOLE 500 MG PO TABS: 500.0000 mg | ORAL_TABLET | Freq: Two times a day (BID) | ORAL | 0 refills | Status: DC

## 2016-04-26 NOTE — Progress Notes (Signed)
Subjective:   Erica [name] is a 20 y.o. 163P2012 African American female here for a routine well-woman exam.  No LMP recorded. Patient has had an implant.    Current complaints: malodorous d/c, no itching/irritation. Does douche.   [facility] making appt w/ them soon        Social History: Sexual: heterosexual Tobacco: no smoking Illicit drugs: no history of illicit drug use  The following portions of the patient's history were reviewed and updated as appropriate: allergies, current medications, past family history, past medical history, past social history, past surgical history and problem list.  Past Medical History Past Medical History:  Diagnosis Date  . Anemia   . Chlamydia 07/06/14  . Chlamydia infection 08/02/2014  . Chlamydia infection during pregnancy, antepartum 07/27/2014   Had +CHL 2/9 treated in hospital 2/19 will check POT 3/1  . Elevated BP 07/27/2014  . Ganglion cyst of wrist    left  . Pelvic pain in female 07/27/2014  . Pregnant 12/29/2014  . Screening for STD (sexually transmitted disease) 07/27/2014  . Sinus infection 12/29/2014  . Strep throat   . Vaginal discharge 12/29/2014  . Yeast infection 04/07/2013    Past Surgical History Past Surgical History:  Procedure Laterality Date  . NO PAST SURGERIES      Gynecologic History [id number]  No LMP recorded. Patient has had an implant. Contraception: Nexplanon Last Pap: never. Results were: n/a Last mammogram: never. Results were: n/a Last TCS: never  Obstetric History OB History  Gravida Para Term Preterm AB Living  3 2 2   1 2   SAB TAB Ectopic Multiple Live Births  1     0 2    # Outcome Date GA Lbr Len/2nd Weight Sex Delivery Anes PTL Lv  3 SAB 01/2015 6319w0d         2 Term 07/16/14 4544w3d 16:48 / 00:15 8 lb 7.4 oz (3.839 kg) F Vag-Spont EPI N LIV  1 Term 06/27/13 4972w0d 07:30 / 00:04 6 lb 13.4 oz (3.1 kg) F Vag-Spont None N LIV      Current Medications No current outpatient prescriptions on  file prior to visit.   No current facility-administered medications on file prior to visit.     Review of Systems Patient denies any headaches, blurred vision, shortness of breath, chest pain, abdominal pain, problems with bowel movements, urination, or intercourse.  Objective:  BP 120/80 (BP Location: Left Arm, Patient Position: Sitting, Cuff Size: Normal)   Pulse (!) 104   Ht 5\' 3"  (1.6 m)   Wt 176 lb (79.8 kg)   Breastfeeding? No   BMI 31.18 kg/m  Physical Exam  General:  Well developed, well nourished, no acute distress. She is alert and oriented x3. Skin:  Warm and dry Neck:  Midline trachea, no thyromegaly or nodules Cardiovascular: Regular rate and rhythm, no murmur heard Lungs:  Effort normal, all lung fields clear to auscultation bilaterally Breasts:  No dominant palpable mass, retraction, or nipple discharge Abdomen:  Soft, non tender, no hepatosplenomegaly or masses Pelvic:  External genitalia is normal in appearance.  The vagina is normal in appearance, +thin white malodorous d/c. The cervix is bulbous, no CMT.  Thin prep pap is done w/ reflex HR HPV cotesting. Uterus is felt to be normal size, shape, and contour.  No adnexal masses or tenderness noted. Extremities:  No swelling or varicosities noted Psych:  She has a normal mood and affect  Results for orders placed or performed in  visit on 04/26/16 (from the past 24 hour(s))  POCT Wet Prep [name]([place]et [place])     Status: Abnormal   Collection Time: 04/26/16  4:19 PM  Result Value Ref Range   Source Wet Prep POC vaginal    WBC, Wet Prep HPF POC none    Bacteria Wet Prep HPF POC None (A) Few   BACTERIA WET PREP MORPHOLOGY POC     Clue Cells Wet Prep HPF POC Many (A) None   Clue Cells Wet Prep Whiff POC Positive Whiff    Yeast Wet Prep HPF POC None    KOH Wet Prep POC     Trichomonas Wet Prep HPF POC Absent Absent     Assessment:   Healthy well-woman exam BV  Plan:  GC/CT on pap Rx metronidazole 500mg  BID x 7d for  BV, no sex or etoh while taking  Stop douching F/U [id number] for physical, or sooner if needed Mammogram @20yo  or sooner if problems Colonoscopy @20yo  or sooner if problems  Erica [name], Erica [name] CNM, [facility] 04/26/2016 4:19 PM

## 2016-04-30 LAB — CYTOLOGY - PAP
CHLAMYDIA, DNA PROBE: NEGATIVE
Diagnosis: NEGATIVE
NEISSERIA GONORRHEA: NEGATIVE

## 2016-06-12 ENCOUNTER — Encounter: Payer: Self-pay | Admitting: Pediatrics

## 2016-06-12 ENCOUNTER — Ambulatory Visit (INDEPENDENT_AMBULATORY_CARE_PROVIDER_SITE_OTHER): Payer: Medicaid Other | Admitting: Pediatrics

## 2016-06-12 VITALS — BP 99/70 | HR 74 | Temp 98.2°F | Ht 63.0 in | Wt 174.2 lb

## 2016-06-12 DIAGNOSIS — H10212 Acute toxic conjunctivitis, left eye: Secondary | ICD-10-CM | POA: Diagnosis not present

## 2016-06-12 MED ORDER — ERYTHROMYCIN 5 MG/GM OP OINT: 1.0000 "application " | TOPICAL_OINTMENT | Freq: Two times a day (BID) | OPHTHALMIC | 0 refills | Status: DC

## 2016-06-12 NOTE — Patient Instructions (Signed)
Preservative free artificial tears every hour Erythromycin ointment twice a day

## 2016-06-12 NOTE — Progress Notes (Signed)
Subjective:   Patient ID: Erica [name], female    DOB: 1995-06-05, 21 y.o.   MRN: [id number] CC: Hospitalization Follow-up (Chemical Conjuctivitis)  HPI: Erica [name] is a 21 y.o. female presenting for Hospitalization Follow-up (Chemical Conjuctivitis)  4 days ago thinks cosmetologist used acetone to get glue gob off of middle of eye from fake eye lashes  Eye lashes supposed to stay on for three weeks  Seen in the ED twice Told she had pink eye Given percocet and eye drops  No pain at first, now very sensitive, anytime she looks up or down it hurts Gets worse at night Swelling goes down throughout the day At night can barely open it, starts itching  Hurts looking laterally Looks at computers all day Eye feels better when closed Most swollen, also crusty in the morning, feel very sensitive  Past Medical History:  Diagnosis Date  . Anemia   . Chlamydia 07/06/14  . Chlamydia infection 08/02/2014  . Chlamydia infection during pregnancy, antepartum 07/27/2014   Had +CHL 2/9 treated in hospital 2/19 will check POT 3/1  . Elevated BP 07/27/2014  . Ganglion cyst of wrist    left  . Pelvic pain in female 07/27/2014  . Pregnant 12/29/2014  . Screening for STD (sexually transmitted disease) 07/27/2014  . Sinus infection 12/29/2014  . Strep throat   . Vaginal discharge 12/29/2014  . Yeast infection 04/07/2013   Family History  Problem Relation Age of Onset  . Diabetes Maternal Grandmother   . Diabetes Paternal Grandmother   . Multiple sclerosis Cousin    Social History   Social History  . Marital status: Legally Separated    Spouse name: N/A  . Number of children: N/A  . Years of education: N/A   Social History Main Topics  . Smoking status: Never Smoker  . Smokeless tobacco: Never Used  . Alcohol use No  . Drug use: No  . Sexual activity: Not Currently    Birth control/ protection: Implant   Other Topics Concern  . None   Social History Narrative  . None   ROS: All  systems negative other than what is in HPI  Objective:    BP 99/70   Pulse 74   Temp 98.2 F (36.8 C) (Oral)   Ht 5\' 3"  (1.6 m)   Wt 174 lb 3.2 oz (79 kg)   BMI 30.86 kg/m   Wt Readings from Last 3 Encounters:  06/12/16 174 lb 3.2 oz (79 kg)  04/26/16 176 lb (79.8 kg)  12/28/15 175 lb (79.4 kg)    Gen: NAD, alert, cooperative with exam, NCAT EYES: EOMI, pain with looking up with L eye, L eye with conjunctival injection, mild swelling upper eye lid, PERRL LYMPH: no cervical LAD CV: NRRR, normal S1/S2, no murmur, distal pulses 2+ b/l Resp: CTABL, no wheezes, normal WOB Neuro: Alert and oriented  Assessment & Plan:  Erica [name] was seen today for hospitalization follow-up, chemical conjunctivitis.  Diagnoses and all orders for this visit:  Chemical conjunctivitis of left eye Discussed case with ophthal, treat with BID erythrmycin ointment, q1h PF artifical tears If not improving needs to be seen Has appt Thursday morning if not improving -     erythromycin ophthalmic ointment; Place 1 application into the left eye 2 (two) times daily.   Follow up plan:  prn Rex Kras, MD [facility]

## 2016-06-15 ENCOUNTER — Telehealth: Payer: Self-pay | Admitting: Pediatrics

## 2016-06-15 NOTE — Telephone Encounter (Signed)
Worsening pain in L eye, now R eye also red. Has appt today with ophthalmology. Pt headed there now.

## 2016-06-15 NOTE — Telephone Encounter (Signed)
Pt instructed to call eye Dr to reschedule appt

## 2016-06-22 ENCOUNTER — Other Ambulatory Visit: Payer: Self-pay | Admitting: Advanced Practice Midwife

## 2016-08-30 ENCOUNTER — Encounter: Payer: Self-pay | Admitting: Women's Health

## 2016-08-30 ENCOUNTER — Ambulatory Visit (INDEPENDENT_AMBULATORY_CARE_PROVIDER_SITE_OTHER): Payer: Medicaid Other | Admitting: Women's Health

## 2016-08-30 VITALS — BP 112/62 | HR 80 | Ht 63.0 in | Wt 169.4 lb

## 2016-08-30 DIAGNOSIS — N926 Irregular menstruation, unspecified: Secondary | ICD-10-CM | POA: Diagnosis not present

## 2016-08-30 DIAGNOSIS — B9689 Other specified bacterial agents as the cause of diseases classified elsewhere: Secondary | ICD-10-CM | POA: Diagnosis not present

## 2016-08-30 DIAGNOSIS — N760 Acute vaginitis: Secondary | ICD-10-CM

## 2016-08-30 LAB — POCT WET PREP (WET MOUNT)
Clue Cells Wet Prep Whiff POC: POSITIVE
TRICHOMONAS WET PREP HPF POC: ABSENT

## 2016-08-30 MED ORDER — METRONIDAZOLE 500 MG PO TABS: 500.0000 mg | ORAL_TABLET | Freq: Two times a day (BID) | ORAL | 0 refills | Status: DC

## 2016-08-30 MED ORDER — IBUPROFEN 800 MG PO TABS: 800.0000 mg | ORAL_TABLET | Freq: Three times a day (TID) | ORAL | 0 refills | Status: DC

## 2016-08-30 NOTE — Progress Notes (Signed)
   [facility] Visit  Patient name: Erica [name] MRN [id number]  Date of birth: 13-Mar-1996  CC & HPI:  Erica [name] is a 21 y.o. G73P2012 African American female presenting today for report of irregular bleeding w/ nexplanon. Placed Oct 2016. Had regular periods for awhile, then became constant, was rx'd megace which helped for awhile, now not helping anymore. Bled entire month of Jan, Feb had 1 normal period, bled entire month of March and still bleeding now. Denies abnormal d/c, itching/irritation. NO change in sex partners since gc/ct neg in Nov, but would like to test again.  No LMP recorded. Patient has had an implant. The current method of family planning is nexplanon. Last pap Nov 2017, neg  Pertinent History Reviewed:  Medical & Surgical Hx:   Past medical, surgical, family, and social history reviewed in electronic medical record Medications: Reviewed & Updated - see associated section Allergies: Reviewed in electronic medical record  Objective Findings:  Vitals: BP 112/62 (BP Location: Right Arm, Patient Position: Sitting, Cuff Size: Normal)   Pulse 80   Ht  (1.6 m)   Wt 169 lb 6.4 oz (76.8 kg)   BMI 30.01 kg/m  Body mass index is 30.01 kg/m.  Physical Examination: General appearance - alert, well appearing, and in no distress Pelvic - cx clear, menstrual type blood, malodorous  Results for orders placed or performed in visit on 08/30/16 (from the past 24 hour(s))  POCT Wet Prep Erica [name])   Collection Time: 08/30/16  5:04 PM  Result Value Ref Range   Source Wet Prep POC vaginal    WBC, Wet Prep HPF POC few    Bacteria Wet Prep HPF POC None (A) Few   BACTERIA WET PREP MORPHOLOGY POC     Clue Cells Wet Prep HPF POC Many (A) None   Clue Cells Wet Prep Whiff POC Positive Whiff    Yeast Wet Prep HPF POC None    KOH Wet Prep POC     Trichomonas Wet Prep HPF POC Absent Absent     Assessment & Plan:  A:   Irregular bleeding w/  nexplanon  BV  P:  Rx metronidazole  BID x 7d for BV, no sex or etoh while taking   Discussed trying NSAIDs to help w/ bleeding- wants to try  Rx ibuprofen  TID x 10d  Call if bleeding not improving  Return for Nov for physical.  Erica [name] CNM, Erica [name] 08/30/2016 5:04 PM

## 2016-08-30 NOTE — Patient Instructions (Signed)
Ibuprofen three times daily x 10 days

## 2016-09-02 LAB — GC/CHLAMYDIA PROBE AMP
Chlamydia trachomatis, NAA: NEGATIVE
NEISSERIA GONORRHOEAE BY PCR: NEGATIVE

## 2016-09-14 ENCOUNTER — Telehealth: Payer: Self-pay | Admitting: *Deleted

## 2016-09-14 MED ORDER — FLUCONAZOLE 150 MG PO TABS: 150.0000 mg | ORAL_TABLET | Freq: Once | ORAL | 0 refills | Status: AC

## 2016-09-14 NOTE — Telephone Encounter (Signed)
Pt thinks she has yeast infection. Wants to see if we will send RX for something to [facility] [place] [facility]. Please advise.

## 2016-09-14 NOTE — Telephone Encounter (Signed)
Informed patient that diflucan was sent. If doesn't improve needs visit. Pt verbalized understanding.

## 2016-10-18 NOTE — Telephone Encounter (Signed)
No phone call,error

## 2016-10-23 ENCOUNTER — Telehealth: Payer: Self-pay | Admitting: Women's Health

## 2016-10-23 ENCOUNTER — Other Ambulatory Visit: Payer: Self-pay | Admitting: Advanced Practice Midwife

## 2016-10-23 ENCOUNTER — Encounter: Payer: Self-pay | Admitting: Advanced Practice Midwife

## 2016-10-23 MED ORDER — FLUCONAZOLE 150 MG PO TABS: ORAL_TABLET | ORAL | 2 refills | Status: DC

## 2016-10-23 NOTE — Telephone Encounter (Signed)
Pt called stating that she would like for Cala BradfordKimberly booker to call in something for her yeast infection. Pt went to the ER and they prescribed her some antibiotics and it has caused the worst yeast nfection she has ever had. I let patient know that kim is not int he office today, pt states if another Dr could prescribe it to her. Please contact pt

## 2016-10-23 NOTE — Progress Notes (Signed)
Diflucan per request 

## 2016-11-07 ENCOUNTER — Ambulatory Visit: Payer: Medicaid Other | Admitting: Women's Health

## 2016-11-09 ENCOUNTER — Encounter: Payer: Self-pay | Admitting: Women's Health

## 2016-11-09 ENCOUNTER — Ambulatory Visit (INDEPENDENT_AMBULATORY_CARE_PROVIDER_SITE_OTHER): Payer: Medicaid Other | Admitting: Women's Health

## 2016-11-09 VITALS — BP 90/50 | HR 76 | Ht 63.0 in | Wt 171.4 lb

## 2016-11-09 DIAGNOSIS — R300 Dysuria: Secondary | ICD-10-CM

## 2016-11-09 DIAGNOSIS — Z3049 Encounter for surveillance of other contraceptives: Secondary | ICD-10-CM | POA: Diagnosis not present

## 2016-11-09 DIAGNOSIS — Z3046 Encounter for surveillance of implantable subdermal contraceptive: Secondary | ICD-10-CM

## 2016-11-09 MED ORDER — NORETHIN-ETH ESTRAD-FE BIPHAS 1 MG-10 MCG / 10 MCG PO TABS: 1.0000 | ORAL_TABLET | Freq: Every day | ORAL | 3 refills | Status: DC

## 2016-11-09 NOTE — Addendum Note (Signed)
Addended by: Shawna ClampBOOKER, Amiere Cawley R on: 11/09/2016 12:29 PM   Modules accepted: Orders

## 2016-11-09 NOTE — Patient Instructions (Signed)
Keep the area clean and dry.  You can remove the big bandage in 24 hours, and the small steri-strip bandage in 3-5 days.  A back up method, such as condoms, should be used for two weeks.  Oral Contraception Use Oral contraceptive pills (OCPs) are medicines taken to prevent pregnancy. OCPs work by preventing the ovaries from releasing eggs. The hormones in OCPs also cause the cervical mucus to thicken, preventing the sperm from entering the uterus. The hormones also cause the uterine lining to become thin, not allowing a fertilized egg to attach to the inside of the uterus. OCPs are highly effective when taken exactly as prescribed. However, OCPs do not prevent sexually transmitted diseases (STDs). Safe sex practices, such as using condoms along with an OCP, can help prevent STDs. Before taking OCPs, you may have a physical exam and Pap test. Your health care provider may also order blood tests if necessary. Your health care provider will make sure you are a good candidate for oral contraception. Discuss with your health care provider the possible side effects of the OCP you may be prescribed. When starting an OCP, it can take 2 to 3 months for the body to adjust to the changes in hormone levels in your body. How to take oral contraceptive pills Your health care provider may advise you on how to start taking the first cycle of OCPs. Otherwise, you can:  Start on day 1 of your menstrual period. You will not need any backup contraceptive protection with this start time.  Start on the first Sunday after your menstrual period or the day you get your prescription. In these cases, you will need to use backup contraceptive protection for the first week.  Start the pill at any time of your cycle. If you take the pill within 5 days of the start of your period, you are protected against pregnancy right away. In this case, you will not need a backup form of birth control. If you start at any other time of your  menstrual cycle, you will need to use another form of birth control for 7 days. If your OCP is the type called a minipill, it will protect you from pregnancy after taking it for 2 days (48 hours). After you have started taking OCPs:  If you forget to take 1 pill, take it as soon as you remember. Take the next pill at the regular time.  If you miss 2 or more pills, call your health care provider because different pills have different instructions for missed doses. Use backup birth control until your next menstrual period starts.  If you use a 28-day pack that contains inactive pills and you miss 1 of the last 7 pills (pills with no hormones), it will not matter. Throw away the rest of the non-hormone pills and start a new pill pack. No matter which day you start the OCP, you will always start a new pack on that same day of the week. Have an extra pack of OCPs and a backup contraceptive method available in case you miss some pills or lose your OCP pack. Follow these instructions at home:  Do not smoke.  Always use a condom to protect against STDs. OCPs do not protect against STDs.  Use a calendar to mark your menstrual period days.  Read the information and directions that came with your OCP. Talk to your health care provider if you have questions. Contact a health care provider if:  You develop nausea and   vomiting.  You have abnormal vaginal discharge or bleeding.  You develop a rash.  You miss your menstrual period.  You are losing your hair.  You need treatment for mood swings or depression.  You get dizzy when taking the OCP.  You develop acne from taking the OCP.  You become pregnant. Get help right away if:  You develop chest pain.  You develop shortness of breath.  You have an uncontrolled or severe headache.  You develop numbness or slurred speech.  You develop visual problems.  You develop pain, redness, and swelling in the legs. This information is not  intended to replace advice given to you by your health care provider. Make sure you discuss any questions you have with your health care provider. Document Released: 05/03/2011 Document Revised: 10/20/2015 Document Reviewed: 11/02/2012 Elsevier Interactive Patient Education  2017 Elsevier Inc.  

## 2016-11-09 NOTE — Progress Notes (Addendum)
Erica KaufmannDanielle T [name] is a 21 y.o. year old Caucasian female here for Nexplanon removal.  Patient given informed consent for removal of her Nexplanon. Placed 03/17/15. Has had very irregular bleeding despite megace and NSAIDs. Wants pills. Does not smoke, no h/o HTN, DVT/PE, CVA, MI, or migraines w/ aura. Some dysuria. Recent uti. Will send urine cx.  Last pap 04/26/16- neg  BP (!) 90/50   Pulse 76   Ht 5\' 3"  (1.6 m)   Wt 171 lb 6.4 oz (77.7 kg)   LMP 10/22/2016   BMI 30.36 kg/m   Appropriate time out taken. Nexplanon site identified.  Area prepped in usual sterile fashon. One cc of 2% lidocaine was used to anesthetize the area at the distal end of the implant. A small stab incision was made right beside the implant on the distal portion.  The Nexplanon rod was grasped using hemostats and removed without difficulty.  There was less than 3 cc blood loss. There were no complications.  Steri-strips were applied over the small incision and a pressure bandage was applied.  The patient tolerated the procedure well.  She was instructed to keep the area clean and dry, remove pressure bandage in 24 hours, and keep insertion site covered with the steri-strip for 3-5 days.    Rx LoLoestrin 3pk w/ 3RF, condoms x 2wks F/U 3mths for coc f/u  Erica [name], Erica [name] CNM, [facility] 11/09/2016 11:38 AM

## 2016-11-11 LAB — URINE CULTURE

## 2016-11-12 ENCOUNTER — Telehealth: Payer: Self-pay | Admitting: Women's Health

## 2016-11-12 NOTE — Telephone Encounter (Signed)
Informed patient that it is possible to have discharge characteristics changes w/ birth control. If no itching/odor/irritation doesn't necessarily need to get it checked. Her urine culture from the other day was neg. Last gc/ct ago and was neg. She can always have that rechecked if she wants. If develops other sx, or just wants it checked out, she can make appt. Verbalized understanding and stated she would make appointment if any changes.

## 2016-11-12 NOTE — Telephone Encounter (Signed)
Patient called stating she now has a thick discharge, no itching or odor.  Thinks it is from the new BCP she just started Friday after having her Nexplanon removed. Concerned it may be developing into a yeast infection. Please advise.

## 2016-11-23 ENCOUNTER — Telehealth: Payer: Self-pay | Admitting: *Deleted

## 2016-11-23 NOTE — Telephone Encounter (Signed)
LMOVM. Pt stated that she had questions about her birth control pills. I advised pt to call us back because I didn't fully understand what her question was to be able to leave any advice on her VM.

## 2016-11-23 NOTE — Telephone Encounter (Signed)
Pt called requesting information on how to take her birth control pills. She is using Lo Loestrin. I advised pt to Start at the top and take a pill each day, even when she gets to the different colored ones. I advised pt to take them at the same time each day. Pt verbalized understanding.

## 2017-01-18 ENCOUNTER — Ambulatory Visit: Payer: 59 | Admitting: Pediatrics

## 2017-01-21 ENCOUNTER — Encounter: Payer: Self-pay | Admitting: Pediatrics

## 2017-02-05 ENCOUNTER — Encounter: Payer: Self-pay | Admitting: Adult Health

## 2017-02-05 ENCOUNTER — Ambulatory Visit (INDEPENDENT_AMBULATORY_CARE_PROVIDER_SITE_OTHER): Payer: 59 | Admitting: Adult Health

## 2017-02-05 VITALS — BP 120/70 | HR 85 | Ht 63.0 in | Wt 174.0 lb

## 2017-02-05 DIAGNOSIS — D573 Sickle-cell trait: Secondary | ICD-10-CM

## 2017-02-05 DIAGNOSIS — N926 Irregular menstruation, unspecified: Secondary | ICD-10-CM

## 2017-02-05 DIAGNOSIS — Z3201 Encounter for pregnancy test, result positive: Secondary | ICD-10-CM | POA: Insufficient documentation

## 2017-02-05 DIAGNOSIS — Z30017 Encounter for initial prescription of implantable subdermal contraceptive: Secondary | ICD-10-CM | POA: Insufficient documentation

## 2017-02-05 DIAGNOSIS — O26851 Spotting complicating pregnancy, first trimester: Secondary | ICD-10-CM | POA: Insufficient documentation

## 2017-02-05 DIAGNOSIS — Z3A01 Less than 8 weeks gestation of pregnancy: Secondary | ICD-10-CM | POA: Insufficient documentation

## 2017-02-05 DIAGNOSIS — O3680X0 Pregnancy with inconclusive fetal viability, not applicable or unspecified: Secondary | ICD-10-CM

## 2017-02-05 LAB — POCT URINE PREGNANCY: Preg Test, Ur: POSITIVE — AB

## 2017-02-05 MED ORDER — PRENATAL VITAMINS 28-0.8 MG PO TABS: ORAL_TABLET | ORAL | Status: DC

## 2017-02-05 NOTE — Patient Instructions (Signed)
First Trimester of Pregnancy The first trimester of pregnancy is from week 1 until the end of week 13 (months 1 through 3). A week after a sperm fertilizes an egg, the egg will implant on the wall of the uterus. This embryo will begin to develop into a baby. Genes from you and your partner will form the baby. The female genes will determine whether the baby will be a boy or a girl. At 6-8 weeks, the eyes and face will be formed, and the heartbeat can be seen on ultrasound. At the end of 12 weeks, all the baby's organs will be formed. Now that you are pregnant, you will want to do everything you can to have a healthy baby. Two of the most important things are to get good prenatal care and to follow your health care provider's instructions. Prenatal care is all the medical care you receive before the baby's birth. This care will help prevent, find, and treat any problems during the pregnancy and childbirth. Body changes during your first trimester Your body goes through many changes during pregnancy. The changes vary from woman to woman.  You may gain or lose a couple of pounds at first.  You may feel sick to your stomach (nauseous) and you may throw up (vomit). If the vomiting is uncontrollable, call your health care provider.  You may tire easily.  You may develop headaches that can be relieved by medicines. All medicines should be approved by your health care provider.  You may urinate more often. Painful urination may mean you have a bladder infection.  You may develop heartburn as a result of your pregnancy.  You may develop constipation because certain hormones are causing the muscles that push stool through your intestines to slow down.  You may develop hemorrhoids or swollen veins (varicose veins).  Your breasts may begin to grow larger and become tender. Your nipples may stick out more, and the tissue that surrounds them (areola) may become darker.  Your gums may bleed and may be  sensitive to brushing and flossing.  Dark spots or blotches (chloasma, mask of pregnancy) may develop on your face. This will likely fade after the baby is born.  Your menstrual periods will stop.  You may have a loss of appetite.  You may develop cravings for certain kinds of food.  You may have changes in your emotions from day to day, such as being excited to be pregnant or being concerned that something may go wrong with the pregnancy and baby.  You may have more vivid and strange dreams.  You may have changes in your hair. These can include thickening of your hair, rapid growth, and changes in texture. Some women also have hair loss during or after pregnancy, or hair that feels dry or thin. Your hair will most likely return to normal after your baby is born.  What to expect at prenatal visits During a routine prenatal visit:  You will be weighed to make sure you and the baby are growing normally.  Your blood pressure will be taken.  Your abdomen will be measured to track your baby's growth.  The fetal heartbeat will be listened to between weeks 10 and 14 of your pregnancy.  Test results from any previous visits will be discussed.  Your health care provider may ask you:  How you are feeling.  If you are feeling the baby move.  If you have had any abnormal symptoms, such as leaking fluid, bleeding, severe headaches,   or abdominal cramping.  If you are using any tobacco products, including cigarettes, chewing tobacco, and electronic cigarettes.  If you have any questions.  Other tests that may be performed during your first trimester include:  Blood tests to find your blood type and to check for the presence of any previous infections. The tests will also be used to check for low iron levels (anemia) and protein on red blood cells (Rh antibodies). Depending on your risk factors, or if you previously had diabetes during pregnancy, you may have tests to check for high blood  sugar that affects pregnant women (gestational diabetes).  Urine tests to check for infections, diabetes, or protein in the urine.  An ultrasound to confirm the proper growth and development of the baby.  Fetal screens for spinal cord problems (spina bifida) and Down syndrome.  HIV (human immunodeficiency virus) testing. Routine prenatal testing includes screening for HIV, unless you choose not to have this test.  You may need other tests to make sure you and the baby are doing well.  Follow these instructions at home: Medicines  Follow your health care provider's instructions regarding medicine use. Specific medicines may be either safe or unsafe to take during pregnancy.  Take a prenatal vitamin that contains at least 600 micrograms (mcg) of folic acid.  If you develop constipation, try taking a stool softener if your health care provider approves. Eating and drinking  Eat a balanced diet that includes fresh fruits and vegetables, whole grains, good sources of protein such as meat, eggs, or tofu, and low-fat dairy. Your health care provider will help you determine the amount of weight gain that is right for you.  Avoid raw meat and uncooked cheese. These carry germs that can cause birth defects in the baby.  Eating four or five small meals rather than three large meals a day may help relieve nausea and vomiting. If you start to feel nauseous, eating a few soda crackers can be helpful. Drinking liquids between meals, instead of during meals, also seems to help ease nausea and vomiting.  Limit foods that are high in fat and processed sugars, such as fried and sweet foods.  To prevent constipation: ? Eat foods that are high in fiber, such as fresh fruits and vegetables, whole grains, and beans. ? Drink enough fluid to keep your urine clear or pale yellow. Activity  Exercise only as directed by your health care provider. Most women can continue their usual exercise routine during  pregnancy. Try to exercise for 30 minutes at least 5 days a week. Exercising will help you: ? Control your weight. ? Stay in shape. ? Be prepared for labor and delivery.  Experiencing pain or cramping in the lower abdomen or lower back is a good sign that you should stop exercising. Check with your health care provider before continuing with normal exercises.  Try to avoid standing for long periods of time. Move your legs often if you must stand in one place for a long time.  Avoid heavy lifting.  Wear low-heeled shoes and practice good posture.  You may continue to have sex unless your health care provider tells you not to. Relieving pain and discomfort  Wear a good support bra to relieve breast tenderness.  Take warm sitz baths to soothe any pain or discomfort caused by hemorrhoids. Use hemorrhoid cream if your health care provider approves.  Rest with your legs elevated if you have leg cramps or low back pain.  If you develop   varicose veins in your legs, wear support hose. Elevate your feet for 15 minutes, 3-4 times a day. Limit salt in your diet. Prenatal care  Schedule your prenatal visits by the twelfth week of pregnancy. They are usually scheduled monthly at first, then more often in the last 2 months before delivery.  Write down your questions. Take them to your prenatal visits.  Keep all your prenatal visits as told by your health care provider. This is important. Safety  Wear your seat belt at all times when driving.  Make a list of emergency phone numbers, including numbers for family, friends, the hospital, and police and fire departments. General instructions  Ask your health care provider for a referral to a local prenatal education class. Begin classes no later than the beginning of month 6 of your pregnancy.  Ask for help if you have counseling or nutritional needs during pregnancy. Your health care provider can offer advice or refer you to specialists for help  with various needs.  Do not use hot tubs, steam rooms, or saunas.  Do not douche or use tampons or scented sanitary pads.  Do not cross your legs for long periods of time.  Avoid cat litter boxes and soil used by cats. These carry germs that can cause birth defects in the baby and possibly loss of the fetus by miscarriage or stillbirth.  Avoid all smoking, herbs, alcohol, and medicines not prescribed by your health care provider. Chemicals in these products affect the formation and growth of the baby.  Do not use any products that contain nicotine or tobacco, such as cigarettes and e-cigarettes. If you need help quitting, ask your health care provider. You may receive counseling support and other resources to help you quit.  Schedule a dentist appointment. At home, brush your teeth with a soft toothbrush and be gentle when you floss. Contact a health care provider if:  You have dizziness.  You have mild pelvic cramps, pelvic pressure, or nagging pain in the abdominal area.  You have persistent nausea, vomiting, or diarrhea.  You have a bad smelling vaginal discharge.  You have pain when you urinate.  You notice increased swelling in your face, hands, legs, or ankles.  You are exposed to fifth disease or chickenpox.  You are exposed to German measles (rubella) and have never had it. Get help right away if:  You have a fever.  You are leaking fluid from your vagina.  You have spotting or bleeding from your vagina.  You have severe abdominal cramping or pain.  You have rapid weight gain or loss.  You vomit blood or material that looks like coffee grounds.  You develop a severe headache.  You have shortness of breath.  You have any kind of trauma, such as from a fall or a car accident. Summary  The first trimester of pregnancy is from week 1 until the end of week 13 (months 1 through 3).  Your body goes through many changes during pregnancy. The changes vary from  woman to woman.  You will have routine prenatal visits. During those visits, your health care provider will examine you, discuss any test results you may have, and talk with you about how you are feeling. This information is not intended to replace advice given to you by your health care provider. Make sure you discuss any questions you have with your health care provider. Document Released: 05/08/2001 Document Revised: 04/25/2016 Document Reviewed: 04/25/2016 Elsevier Interactive Patient Education  2017 Elsevier   Inc.  

## 2017-02-05 NOTE — Progress Notes (Signed)
Subjective:     Patient ID: Erica [name], female   DOB: 1996/03/26, 21 y.o.   MRN: [id number]  HPI Erica [name] is a 21 year old black female in for UPT, had +HPT, she missed a pill on her OCs, and made it up.[facility] for spotting after sex,and  it has stopped.QHCG was 2032 Sunday and US showed +IUP with YS and fetal pole, no FHM seen, she says.(She had nexplanon removed 11/09/16 and was on lo Loestrin.)   Review of Systems +HPT Spotting after sex  Reviewed past medical,surgical, social and family history. Reviewed medications and allergies.     Objective:   Physical Exam BP 120/70 (BP Location: Left Arm, Patient Position: Sitting, Cuff Size: Normal)   Pulse 85   Ht 5\' 3"  (1.6 m)   Wt 174 lb (78.9 kg)   LMP 11/14/2016 (Approximate)   BMI 30.82 kg/m UPT+, about 6+1 week by US, with EDD 10/02/17.Skin warm and dry. Neck: mid line trachea, normal thyroid, good ROM, no lymphadenopathy noted. Lungs: clear to ausculation bilaterally. Cardiovascular: regular rate and rhythm.Abdomen is soft and non tender. [facility] and US in 1 week.No sex for now.Her blood type is O+, and she has sickle trait. Can check partner at health dept for trait, or even here if desired.[facility] is [facility]     Assessment:     1. Pregnancy examination or test, positive result   2. Less than [redacted] weeks gestation of pregnancy   3. Encounter to determine fetal viability of pregnancy, single or unspecified fetus   4. Spotting affecting pregnancy in first trimester   5. Sickle cell trait (HCC)       Plan:    Check QHCG Return in 1 week for dating US Review handouts on first trimester and by [facility] No sex til 7 days of wiping no color.

## 2017-02-06 ENCOUNTER — Telehealth: Payer: Self-pay | Admitting: Adult Health

## 2017-02-06 LAB — BETA HCG QUANT (REF LAB): HCG QUANT: 3530 m[IU]/mL

## 2017-02-06 NOTE — Telephone Encounter (Signed)
Pt aware of quant results. Advised to keep US appt. Call with any concerns. Pt voiced understanding. JSY

## 2017-02-13 ENCOUNTER — Ambulatory Visit (INDEPENDENT_AMBULATORY_CARE_PROVIDER_SITE_OTHER): Payer: 59

## 2017-02-13 DIAGNOSIS — Z3A01 Less than 8 weeks gestation of pregnancy: Secondary | ICD-10-CM

## 2017-02-13 DIAGNOSIS — O3680X0 Pregnancy with inconclusive fetal viability, not applicable or unspecified: Secondary | ICD-10-CM

## 2017-02-13 NOTE — Progress Notes (Signed)
[place] 6+5 wks,single IUP w/ys,positive fht 122 bpm,normal ovaries bilat,subchorionic hemorrhage 3 x 1 x .6 cm,crl 8.45 mm,EDD 10/04/2017

## 2017-02-15 ENCOUNTER — Ambulatory Visit: Payer: Medicaid Other | Admitting: Women's Health

## 2017-02-27 ENCOUNTER — Ambulatory Visit: Payer: 59 | Admitting: *Deleted

## 2017-02-27 ENCOUNTER — Encounter: Payer: Self-pay | Admitting: Women's Health

## 2017-02-27 ENCOUNTER — Ambulatory Visit (INDEPENDENT_AMBULATORY_CARE_PROVIDER_SITE_OTHER): Payer: 59 | Admitting: Women's Health

## 2017-02-27 VITALS — BP 102/60 | HR 74 | Wt 175.0 lb

## 2017-02-27 DIAGNOSIS — D573 Sickle-cell trait: Secondary | ICD-10-CM

## 2017-02-27 DIAGNOSIS — Z3A08 8 weeks gestation of pregnancy: Secondary | ICD-10-CM | POA: Diagnosis not present

## 2017-02-27 DIAGNOSIS — Z331 Pregnant state, incidental: Secondary | ICD-10-CM

## 2017-02-27 DIAGNOSIS — Z1389 Encounter for screening for other disorder: Secondary | ICD-10-CM

## 2017-02-27 DIAGNOSIS — N898 Other specified noninflammatory disorders of vagina: Secondary | ICD-10-CM | POA: Diagnosis not present

## 2017-02-27 DIAGNOSIS — O99011 Anemia complicating pregnancy, first trimester: Secondary | ICD-10-CM

## 2017-02-27 DIAGNOSIS — O26891 Other specified pregnancy related conditions, first trimester: Secondary | ICD-10-CM

## 2017-02-27 DIAGNOSIS — Z3682 Encounter for antenatal screening for nuchal translucency: Secondary | ICD-10-CM

## 2017-02-27 DIAGNOSIS — Z3490 Encounter for supervision of normal pregnancy, unspecified, unspecified trimester: Secondary | ICD-10-CM | POA: Insufficient documentation

## 2017-02-27 DIAGNOSIS — Z3481 Encounter for supervision of other normal pregnancy, first trimester: Secondary | ICD-10-CM

## 2017-02-27 LAB — POCT WET PREP (WET MOUNT)
CLUE CELLS WET PREP WHIFF POC: NEGATIVE
TRICHOMONAS WET PREP HPF POC: ABSENT

## 2017-02-27 NOTE — Addendum Note (Signed)
Addended by: Moss Mc on: 02/27/2017 03:27 PM   Modules accepted: Orders

## 2017-02-27 NOTE — Patient Instructions (Signed)

## 2017-02-27 NOTE — Progress Notes (Signed)
[facility] Initial OB Visit  Patient name: Erica [name] MRN [id number]  Date of birth: Sep 15, 1995 CC & HPI:  Erica [name] is a 21 y.o. [id number] African American female at [redacted]w[redacted]d by 6wk u/s, with an Estimated Date of Delivery: 10/04/17 being seen today for her initial obstetrical visit. Her obstetrical history is significant for term uncomplicated svb x 2, sab x 1; Erica [name] trait +.   Today she reports some nausea- declines meds at this time; some thicker white vaginal d/c for about a week or so, no itching/irritation, thinks it may be yeast infection. Hasn't tried anything otc. Review of Systems:   Denies cramping/contractions, leakage of fluid, vaginal bleeding, abnormal vaginal discharge w/ itching/odor/irritation, headaches, visual changes, shortness of breath, chest pain, abdominal pain, severe nausea/vomiting, or problems with urination or bowel movements.  Pertinent History Reviewed:   OB History  Gravida Para Term Preterm AB Living  SAB TAB Ectopic Multiple Live Births  1     0 2    # Outcome Date GA Lbr Len/2nd Weight Sex Delivery Anes PTL Lv  4 Current           3 SAB 01/2015 [redacted]w[redacted]d         2 Term 07/16/14 [redacted]w[redacted]d 16:48 / 00:15 8 lb 7.4 oz (3.839 kg) F Vag-Spont EPI N LIV  1 Term 06/27/13 [redacted]w[redacted]d 07:30 / 00:04 6 lb 13.4 oz (3.1 kg) F Vag-Spont None N LIV     Reviewed past medical,surgical, social and family history.  Reviewed problem list, medications and allergies. Objective Findings:   Vitals:   02/27/17 1416  BP: 102/60  Pulse: 74  Weight: 175 lb (79.4 kg)    Body mass index is 31 kg/m.  Exam System:     General: Well developed & nourished, no acute distress   Skin: Warm & dry, normal coloration and turgor, no rashes   Neurologic: Alert & oriented, normal mood   Cardiovascular: Regular rate & rhythm   Respiratory: Effort & rate normal, LCTAB, acyanotic   Abdomen: Soft, non tender   Extremities: normal strength, tone   Pelvic Exam:    Perineum: Normal perineum   Vulva: Normal, no lesions   Vagina:  Normal mucosa, small amt white nonodorous d/c   Cervix: Normal, bulbous, appears closed   Uterus: Normal size/shape/contour for GA   Pap smear: 03/2016, neg  FHR: + via informal transabdominal u/s  Results for orders placed or performed in visit on 02/27/17 (from the past 24 hour(s))  POCT Wet Prep Erica [name] [facility])   Collection Time: 02/27/17  3:15 PM  Result Value Ref Range   Source Wet Prep POC vaginal    WBC, Wet Prep HPF POC few    Bacteria Wet Prep HPF POC None (A) Few   BACTERIA WET PREP MORPHOLOGY POC     Clue Cells Wet Prep HPF POC Few (A) None   Clue Cells Wet Prep Whiff POC Negative Whiff    Yeast Wet Prep HPF POC None    KOH Wet Prep POC     Trichomonas Wet Prep HPF POC Absent Absent    Assessment & Plan:  1) Low-Risk Pregnancy Erica [name] at [redacted]w[redacted]d with an Estimated Date of Delivery: 10/04/17  2) Initial OB visit 3) Vaginal discharge> only very few clues, nonodorous, not really symptomatic> recommended holding off on tx unless symptomatic 4) [place] trait +> plan urine cx q trimester 5) Nausea of pregnancy> declines meds  Initial labs obtained Continue prenatal vitamins Reviewed n/v relief measures and warning s/s to report Reviewed recommended weight gain based on pre-gravid BMI Encouraged well-balanced diet Genetic Screening discussed Integrated Screen: requested Cystic fibrosis screening discussed neg prev preg Ultrasound discussed; fetal survey: requested CCNC completed Declined flu shot  Return in about 4 weeks (around 03/27/2017) for LROB, US:NT+1stIT.   Orders Placed This Encounter  Procedures  . GC/Chlamydia Probe Amp  . Urine Culture  . US Fetal Nuchal Translucency Measurement  . CBC  . Hepatitis B surface antigen  . HIV antibody  . RPR  . Rubella screen  . Urinalysis, Routine w reflex microscopic  . Varicella zoster antibody, IgG  . POCT Urinalysis Dipstick  . POCT Wet Prep [employer])  .  ABO/Rh  . Antibody screen    Marge Duncans CNM, [facility] 02/27/2017 3:18 PM

## 2017-02-28 LAB — VARICELLA ZOSTER ANTIBODY, IGG: VARICELLA: 223 {index} (ref >165)

## 2017-02-28 LAB — PMP SCREEN PROFILE (10S), URINE
AMPHETAMINE SCREEN URINE: NEGATIVE ng/mL
BARBITURATE SCREEN URINE: NEGATIVE ng/mL
BENZODIAZEPINE SCREEN, URINE: NEGATIVE ng/mL
CANNABINOIDS UR QL SCN: NEGATIVE ng/mL
CREATININE(CRT), U: 69.5 mg/dL (ref 20.0–300.0)
Cocaine (Metab) Scrn, Ur: NEGATIVE ng/mL
Methadone Screen, Urine: NEGATIVE ng/mL
OXYCODONE+OXYMORPHONE UR QL SCN: NEGATIVE ng/mL
Opiate Scrn, Ur: NEGATIVE ng/mL
PH UR, DRUG SCRN: 6.2 (ref 4.5–8.9)
PHENCYCLIDINE QUANTITATIVE URINE: NEGATIVE ng/mL
PROPOXYPHENE SCREEN URINE: NEGATIVE ng/mL

## 2017-02-28 LAB — MICROSCOPIC EXAMINATION
Bacteria, UA: NONE SEEN
Casts: NONE SEEN /lpf

## 2017-02-28 LAB — URINALYSIS, ROUTINE W REFLEX MICROSCOPIC
Bilirubin, UA: NEGATIVE
Glucose, UA: NEGATIVE
Ketones, UA: NEGATIVE
NITRITE UA: NEGATIVE
PH UA: 6.5 (ref 5.0–7.5)
PROTEIN UA: NEGATIVE
RBC, UA: NEGATIVE
Specific Gravity, UA: 1.012 (ref 1.005–1.030)
UUROB: 0.2 mg/dL (ref 0.2–1.0)

## 2017-02-28 LAB — ABO/RH: Rh Factor: POSITIVE

## 2017-02-28 LAB — HEPATITIS B SURFACE ANTIGEN: Hepatitis B Surface Ag: NEGATIVE

## 2017-02-28 LAB — CBC
Hematocrit: 39.6 % (ref 34.0–46.6)
Hemoglobin: 13.1 g/dL (ref 11.1–15.9)
MCH: 27.4 pg (ref 26.6–33.0)
MCHC: 33.1 g/dL (ref 31.5–35.7)
MCV: 83 fL (ref 79–97)
PLATELETS: 334 10*3/uL (ref 150–379)
RBC: 4.78 x10E6/uL (ref 3.77–5.28)
RDW: 12.9 % (ref 12.3–15.4)
WBC: 10.3 10*3/uL (ref 3.4–10.8)

## 2017-02-28 LAB — HIV ANTIBODY (ROUTINE TESTING W REFLEX): HIV Screen 4th Generation wRfx: NONREACTIVE

## 2017-02-28 LAB — RPR: RPR: NONREACTIVE

## 2017-02-28 LAB — RUBELLA SCREEN: Rubella Antibodies, IGG: 1.97 index (ref >0.99)

## 2017-02-28 LAB — ANTIBODY SCREEN: ANTIBODY SCREEN: NEGATIVE

## 2017-03-01 LAB — GC/CHLAMYDIA PROBE AMP
Chlamydia trachomatis, NAA: NEGATIVE
Neisseria gonorrhoeae by PCR: NEGATIVE

## 2017-03-03 LAB — URINE CULTURE

## 2017-03-21 ENCOUNTER — Telehealth: Payer: Self-pay | Admitting: *Deleted

## 2017-03-21 MED ORDER — TERCONAZOLE 0.4 % VA CREA: 1.0000 | TOPICAL_CREAM | Freq: Every day | VAGINAL | 0 refills | Status: DC

## 2017-03-21 NOTE — Telephone Encounter (Signed)
Pt aware Terazol was sent to pharmacy. Advised no sex while using med. Needs follow up if not better after using. Pt voiced understanding. JSY

## 2017-03-21 NOTE — Telephone Encounter (Signed)
Spoke with pt. She has tried Monistat with no help. Can you prescribe Diflucan? Thanks!! JSY

## 2017-03-27 ENCOUNTER — Encounter: Payer: 59 | Admitting: Women's Health

## 2017-03-27 ENCOUNTER — Other Ambulatory Visit: Payer: 59

## 2017-03-27 ENCOUNTER — Ambulatory Visit (INDEPENDENT_AMBULATORY_CARE_PROVIDER_SITE_OTHER): Payer: 59 | Admitting: Women's Health

## 2017-03-27 ENCOUNTER — Ambulatory Visit (INDEPENDENT_AMBULATORY_CARE_PROVIDER_SITE_OTHER): Payer: 59

## 2017-03-27 ENCOUNTER — Encounter: Payer: Self-pay | Admitting: Women's Health

## 2017-03-27 VITALS — BP 114/66 | HR 86 | Wt 177.0 lb

## 2017-03-27 DIAGNOSIS — Z3682 Encounter for antenatal screening for nuchal translucency: Secondary | ICD-10-CM

## 2017-03-27 DIAGNOSIS — Z3A12 12 weeks gestation of pregnancy: Secondary | ICD-10-CM

## 2017-03-27 DIAGNOSIS — Z331 Pregnant state, incidental: Secondary | ICD-10-CM | POA: Diagnosis not present

## 2017-03-27 DIAGNOSIS — Z3481 Encounter for supervision of other normal pregnancy, first trimester: Secondary | ICD-10-CM

## 2017-03-27 DIAGNOSIS — Z23 Encounter for immunization: Secondary | ICD-10-CM | POA: Diagnosis not present

## 2017-03-27 DIAGNOSIS — Z1389 Encounter for screening for other disorder: Secondary | ICD-10-CM

## 2017-03-27 LAB — POCT URINALYSIS DIPSTICK
GLUCOSE UA: NEGATIVE
KETONES UA: NEGATIVE
Leukocytes, UA: NEGATIVE
NITRITE UA: NEGATIVE
Protein, UA: NEGATIVE
RBC UA: NEGATIVE

## 2017-03-27 NOTE — Patient Instructions (Signed)
Erica [name], I greatly value your feedback.  If you receive a survey following your visit with us today, we appreciate you taking the time to fill it out.  Thanks, [name], CNM, WHNP-BC   Second Trimester of Pregnancy The second trimester is from week 14 through week 27 (months 4 through 6). The second trimester is often a time when you feel your best. Your body has adjusted to being pregnant, and you begin to feel better physically. Usually, morning sickness has lessened or quit completely, you may have more energy, and you may have an increase in appetite. The second trimester is also a time when the fetus is growing rapidly. At the end of the sixth month, the fetus is about 9 inches long and weighs about 1 pounds. You will likely begin to feel the baby move (quickening) between 16 and 20 weeks of pregnancy. Body changes during your second trimester Your body continues to go through many changes during your second trimester. The changes vary from woman to woman.  Your weight will continue to increase. You will notice your lower abdomen bulging out.  You may begin to get stretch marks on your hips, abdomen, and breasts.  You may develop headaches that can be relieved by medicines. The medicines should be approved by your health care provider.  You may urinate more often because the fetus is pressing on your bladder.  You may develop or continue to have heartburn as a result of your pregnancy.  You may develop constipation because certain hormones are causing the muscles that push waste through your intestines to slow down.  You may develop hemorrhoids or swollen, bulging veins (varicose veins).  You may have back pain. This is caused by: ? Weight gain. ? Pregnancy hormones that are relaxing the joints in your pelvis. ? A shift in weight and the muscles that support your balance.  Your breasts will continue to grow and they will continue to become tender.  Your gums may  bleed and may be sensitive to brushing and flossing.  Dark spots or blotches (chloasma, mask of pregnancy) may develop on your face. This will likely fade after the baby is born.  A dark line from your belly button to the pubic area (linea nigra) may appear. This will likely fade after the baby is born.  You may have changes in your hair. These can include thickening of your hair, rapid growth, and changes in texture. Some women also have hair loss during or after pregnancy, or hair that feels dry or thin. Your hair will most likely return to normal after your baby is born.  What to expect at prenatal visits During a routine prenatal visit:  You will be weighed to make sure you and the fetus are growing normally.  Your blood pressure will be taken.  Your abdomen will be measured to track your baby's growth.  The fetal heartbeat will be listened to.  Any test results from the previous visit will be discussed.  Your health care provider may ask you:  How you are feeling.  If you are feeling the baby move.  If you have had any abnormal symptoms, such as leaking fluid, bleeding, severe headaches, or abdominal cramping.  If you are using any tobacco products, including cigarettes, chewing tobacco, and electronic cigarettes.  If you have any questions.  Other tests that may be performed during your second trimester include:  Blood tests that check for: ? Low iron levels (anemia). ? High  blood sugar that affects pregnant women (gestational diabetes) between 42 and 28 weeks. ? Rh antibodies. This is to check for a protein on red blood cells (Rh factor).  Urine tests to check for infections, diabetes, or protein in the urine.  An ultrasound to confirm the proper growth and development of the baby.  An amniocentesis to check for possible genetic problems.  Fetal screens for spina bifida and Down syndrome.  HIV (human immunodeficiency virus) testing. Routine prenatal testing  includes screening for HIV, unless you choose not to have this test.  Follow these instructions at home: Medicines  Follow your health care provider's instructions regarding medicine use. Specific medicines may be either safe or unsafe to take during pregnancy.  Take a prenatal vitamin that contains at least 600 micrograms (mcg) of folic acid.  If you develop constipation, try taking a stool softener if your health care provider approves. Eating and drinking  Eat a balanced diet that includes fresh fruits and vegetables, whole grains, good sources of protein such as meat, eggs, or tofu, and low-fat dairy. Your health care provider will help you determine the amount of weight gain that is right for you.  Avoid raw meat and uncooked cheese. These carry germs that can cause birth defects in the baby.  If you have low calcium intake from food, talk to your health care provider about whether you should take a daily calcium supplement.  Limit foods that are high in fat and processed sugars, such as fried and sweet foods.  To prevent constipation: ? Drink enough fluid to keep your urine clear or pale yellow. ? Eat foods that are high in fiber, such as fresh fruits and vegetables, whole grains, and beans. Activity  Exercise only as directed by your health care provider. Most women can continue their usual exercise routine during pregnancy. Try to exercise for 30 minutes at least 5 days a week. Stop exercising if you experience uterine contractions.  Avoid heavy lifting, wear low heel shoes, and practice good posture.  A sexual relationship may be continued unless your health care provider directs you otherwise. Relieving pain and discomfort  Wear a good support bra to prevent discomfort from breast tenderness.  Take warm sitz baths to soothe any pain or discomfort caused by hemorrhoids. Use hemorrhoid cream if your health care provider approves.  Rest with your legs elevated if you have  leg cramps or low back pain.  If you develop varicose veins, wear support hose. Elevate your feet for 15 minutes, 3-4 times a day. Limit salt in your diet. Prenatal Care  Write down your questions. Take them to your prenatal visits.  Keep all your prenatal visits as told by your health care provider. This is important. Safety  Wear your seat belt at all times when driving.  Make a list of emergency phone numbers, including numbers for family, friends, the hospital, and police and fire departments. General instructions  Ask your health care provider for a referral to a local prenatal education class. Begin classes no later than the beginning of month 6 of your pregnancy.  Ask for help if you have counseling or nutritional needs during pregnancy. Your health care provider can offer advice or refer you to specialists for help with various needs.  Do not use hot tubs, steam rooms, or saunas.  Do not douche or use tampons or scented sanitary pads.  Do not cross your legs for long periods of time.  Avoid cat litter boxes and soil  used by cats. These carry germs that can cause birth defects in the baby and possibly loss of the fetus by miscarriage or stillbirth.  Avoid all smoking, herbs, alcohol, and unprescribed drugs. Chemicals in these products can affect the formation and growth of the baby.  Do not use any products that contain nicotine or tobacco, such as cigarettes and e-cigarettes. If you need help quitting, ask your health care provider.  Visit your dentist if you have not gone yet during your pregnancy. Use a soft toothbrush to brush your teeth and be gentle when you floss. Contact a health care provider if:  You have dizziness.  You have mild pelvic cramps, pelvic pressure, or nagging pain in the abdominal area.  You have persistent nausea, vomiting, or diarrhea.  You have a bad smelling vaginal discharge.  You have pain when you urinate. Get help right away if:  You  have a fever.  You are leaking fluid from your vagina.  You have spotting or bleeding from your vagina.  You have severe abdominal cramping or pain.  You have rapid weight gain or weight loss.  You have shortness of breath with chest pain.  You notice sudden or extreme swelling of your face, hands, ankles, feet, or legs.  You have not felt your baby move in over an hour.  You have severe headaches that do not go away when you take medicine.  You have vision changes. Summary  The second trimester is from week 14 through week 27 (months 4 through 6). It is also a time when the fetus is growing rapidly.  Your body goes through many changes during pregnancy. The changes vary from woman to woman.  Avoid all smoking, herbs, alcohol, and unprescribed drugs. These chemicals affect the formation and growth your baby.  Do not use any tobacco products, such as cigarettes, chewing tobacco, and e-cigarettes. If you need help quitting, ask your health care provider.  Contact your health care provider if you have any questions. Keep all prenatal visits as told by your health care provider. This is important. This information is not intended to replace advice given to you by your health care provider. Make sure you discuss any questions you have with your health care provider. Document Released: 05/08/2001 Document Revised: 10/20/2015 Document Reviewed: 07/15/2012 Elsevier Interactive Patient Education  2017 [employer].

## 2017-03-27 NOTE — Progress Notes (Signed)
LOW-RISK PREGNANCY VISIT Patient name: Erica [name] MRN [id number]  Date of birth: [birth year] Chief Complaint:   Routine Prenatal Visit (IT 1)  History of Present Illness:   Erica [name] is a 21 y.o. [phone] female at 101w5d with an Estimated Date of Delivery: 10/04/17 being seen today for ongoing management of a low-risk pregnancy.  Today she reports some spotting few weeks back, none now. Contractions: Not present. Vag. Bleeding: None.  Movement: Absent. denies leaking of fluid. Review of Systems:   Pertinent items are noted in HPI Denies abnormal vaginal discharge w/ itching/odor/irritation, headaches, visual changes, shortness of breath, chest pain, abdominal pain, severe nausea/vomiting, or problems with urination or bowel movements unless otherwise stated above. Pertinent History Reviewed:  Reviewed past medical,surgical, social, obstetrical and family history.  Reviewed problem list, medications and allergies. Physical Assessment:   Vitals:   03/27/17 1041  BP: 114/66  Pulse: 86  Weight: 177 lb (80.3 kg)  Body mass index is 31.35 kg/m.        Physical Examination:   General appearance: Well appearing, and in no distress  Mental status: Alert, oriented to person, place, and time  Skin: Warm & dry  Cardiovascular: Normal heart rate noted  Respiratory: Normal respiratory effort, no distress  Abdomen: Soft, gravid, nontender  Pelvic: Cervical exam deferred         Extremities: Edema: None  Fetal Status: Fetal Heart Rate (bpm): 150 u/s   Movement: Absent    Today's NT u/s: [place] 12+5 wks,measurements c/w dates,NB present,NT 1.3 mm,normal ovaries bilat,post pl gr 0,crl 64.37 mm,fhr 150 bpm  Results for orders placed or performed in visit on 03/27/17 (from the past 24 hour(s))  POCT Urinalysis Dipstick   Collection Time: 03/27/17 10:44 AM  Result Value Ref Range   Color, UA     Clarity, UA     Glucose, UA negative    Bilirubin, UA     Ketones, UA negative      Spec Grav, UA  1.010 - 1.025   Blood, UA negative    pH, UA  5.0 - 8.0   Protein, UA negative    Urobilinogen, UA  0.2 or 1.0 E.U./dL   Nitrite, UA negative    Leukocytes, UA Negative Negative    Assessment & Plan:  1) Low-risk pregnancy [id number] at 109w5d with an Estimated Date of Delivery: 10/04/17    Labs/procedures today: 1st IT/NT, flu shot  Plan:  Continue routine obstetrical care   Reviewed: today's normal NT u/s,  Preterm labor symptoms and general obstetric precautions including but not limited to vaginal bleeding, contractions, leaking of fluid and fetal movement were reviewed in detail with the patient.  All questions were answered  Follow-up: Return in about 4 weeks (around 04/24/2017) for LROB, 2nd IT.  Orders Placed This Encounter  Procedures  . Integrated 1  . POCT Urinalysis Dipstick   Marge Duncans CNM, [facility] 03/27/2017 10:53 AM

## 2017-03-27 NOTE — Progress Notes (Addendum)
US 12+5 wks,measurements c/w dates,NB present,NT 1.3 mm,normal ovaries bilat,post pl gr 0,crl 64.37 mm,fhr 150 bpm

## 2017-03-27 NOTE — Addendum Note (Signed)
Addended by: Sherre LainASH, Belissa Kooy A on: 03/27/2017 11:20 AM   Modules accepted: Orders

## 2017-04-11 ENCOUNTER — Encounter: Payer: Self-pay | Admitting: Women's Health

## 2017-04-24 ENCOUNTER — Ambulatory Visit (INDEPENDENT_AMBULATORY_CARE_PROVIDER_SITE_OTHER): Payer: 59 | Admitting: Advanced Practice Midwife

## 2017-04-24 VITALS — BP 112/60 | HR 90 | Wt 178.0 lb

## 2017-04-24 DIAGNOSIS — Z3482 Encounter for supervision of other normal pregnancy, second trimester: Secondary | ICD-10-CM

## 2017-04-24 DIAGNOSIS — Z1379 Encounter for other screening for genetic and chromosomal anomalies: Secondary | ICD-10-CM

## 2017-04-24 DIAGNOSIS — Z3A16 16 weeks gestation of pregnancy: Secondary | ICD-10-CM

## 2017-04-24 DIAGNOSIS — Z1389 Encounter for screening for other disorder: Secondary | ICD-10-CM

## 2017-04-24 DIAGNOSIS — Z331 Pregnant state, incidental: Secondary | ICD-10-CM

## 2017-04-24 DIAGNOSIS — Z363 Encounter for antenatal screening for malformations: Secondary | ICD-10-CM

## 2017-04-24 LAB — POCT URINALYSIS DIPSTICK
Blood, UA: NEGATIVE
GLUCOSE UA: NEGATIVE
LEUKOCYTES UA: NEGATIVE
NITRITE UA: POSITIVE
Protein, UA: NEGATIVE

## 2017-04-24 NOTE — Patient Instructions (Addendum)
Erica [name], I greatly value your feedback.  If you receive a survey following your visit with us today, we appreciate you taking the time to fill it out.  Thanks, Erica [name], CNM  Safe Medications in Pregnancy   Acne: Benzoyl Peroxide Salicylic Acid  Backache/Headache: Tylenol: 2 regular strength every 4 hours OR              2 Extra strength every 6 hours  Colds/Coughs/Allergies: Benadryl (alcohol free) 25 mg every 6 hours as needed Breath right strips Claritin Cepacol throat lozenges Chloraseptic throat spray Cold-Eeze- up to three times per day Cough drops, alcohol free Flonase (by prescription only) Guaifenesin Mucinex Robitussin DM (plain only, alcohol free) Saline nasal spray/drops Sudafed (pseudoephedrine) & Actifed ** use only after [redacted] weeks gestation and if you do not have high blood pressure Tylenol Vicks Vaporub Zinc lozenges Zyrtec   Constipation: Colace Ducolax suppositories Fleet enema Glycerin suppositories Metamucil Milk of magnesia Miralax Senokot Smooth move tea  Diarrhea: Kaopectate Imodium A-D  *NO pepto Bismol  Hemorrhoids: Anusol Anusol HC Preparation H Tucks  Indigestion: Tums Maalox Mylanta Zantac  Pepcid  Insomnia: Benadryl (alcohol free) 25mg  every 6 hours as needed Tylenol PM Unisom, no Gelcaps  Leg Cramps: Tums MagGel  Nausea/Vomiting:  Bonine Dramamine Emetrol Ginger extract Sea bands Meclizine  Nausea medication to take during pregnancy:  Unisom (doxylamine succinate 25 mg tablets) Take one tablet daily at bedtime. If symptoms are not adequately controlled, the dose can be increased to a maximum recommended dose of two tablets daily (1/2 tablet in the morning, 1/2 tablet mid-afternoon and one at bedtime). Vitamin B6 100mg  tablets. Take one tablet twice a day (up to 200 mg per day).  Skin Rashes: Aveeno products Benadryl cream or 25mg  every 6 hours as needed Calamine Lotion 1%  cortisone cream  Yeast infection: Gyne-lotrimin 7 Monistat 7   **If taking multiple medications, please check labels to avoid duplicating the same active ingredients **take medication as directed on the label ** Do not exceed 4000 mg of tylenol in 24 hours **Do not take medications that contain aspirin or ibuprofen       Second Trimester of Pregnancy The second trimester is from week 14 through week 27 (months 4 through 6). The second trimester is often a time when you feel your best. Your body has adjusted to being pregnant, and you begin to feel better physically. Usually, morning sickness has lessened or quit completely, you may have more energy, and you may have an increase in appetite. The second trimester is also a time when the fetus is growing rapidly. At the end of the sixth month, the fetus is about 9 inches long and weighs about 1 pounds. You will likely begin to feel the baby move (quickening) between 16 and 20 weeks of pregnancy. Body changes during your second trimester Your body continues to go through many changes during your second trimester. The changes vary from woman to woman.  Your weight will continue to increase. You will notice your lower abdomen bulging out.  You may begin to get stretch marks on your hips, abdomen, and breasts.  You may develop headaches that can be relieved by medicines. The medicines should be approved by your health care provider.  You may urinate more often because the fetus is pressing on your bladder.  You may develop or continue to have heartburn as a result of your pregnancy.  You may develop constipation because certain hormones are causing the muscles  that push waste through your intestines to slow down.  You may develop hemorrhoids or swollen, bulging veins (varicose veins).  You may have back pain. This is caused by: ? Weight gain. ? Pregnancy hormones that are relaxing the joints in your pelvis. ? A shift in weight and  the muscles that support your balance.  Your breasts will continue to grow and they will continue to become tender.  Your gums may bleed and may be sensitive to brushing and flossing.  Dark spots or blotches (chloasma, mask of pregnancy) may develop on your face. This will likely fade after the baby is born.  A dark line from your belly button to the pubic area (linea nigra) may appear. This will likely fade after the baby is born.  You may have changes in your hair. These can include thickening of your hair, rapid growth, and changes in texture. Some women also have hair loss during or after pregnancy, or hair that feels dry or thin. Your hair will most likely return to normal after your baby is born.  What to expect at prenatal visits During a routine prenatal visit:  You will be weighed to make sure you and the fetus are growing normally.  Your blood pressure will be taken.  Your abdomen will be measured to track your baby's growth.  The fetal heartbeat will be listened to.  Any test results from the previous visit will be discussed.  Your health care provider may ask you:  How you are feeling.  If you are feeling the baby move.  If you have had any abnormal symptoms, such as leaking fluid, bleeding, severe headaches, or abdominal cramping.  If you are using any tobacco products, including cigarettes, chewing tobacco, and electronic cigarettes.  If you have any questions.  Other tests that may be performed during your second trimester include:  Blood tests that check for: ? Low iron levels (anemia). ? High blood sugar that affects pregnant women (gestational diabetes) between 3524 and 28 weeks. ? Rh antibodies. This is to check for a protein on red blood cells (Rh factor).  Urine tests to check for infections, diabetes, or protein in the urine.  An ultrasound to confirm the proper growth and development of the baby.  An amniocentesis to check for possible genetic  problems.  Fetal screens for spina bifida and Down syndrome.  HIV (human immunodeficiency virus) testing. Routine prenatal testing includes screening for HIV, unless you choose not to have this test.  Follow these instructions at home: Medicines  Follow your health care provider's instructions regarding medicine use. Specific medicines may be either safe or unsafe to take during pregnancy.  Take a prenatal vitamin that contains at least 600 micrograms (mcg) of folic acid.  If you develop constipation, try taking a stool softener if your health care provider approves. Eating and drinking  Eat a balanced diet that includes fresh fruits and vegetables, whole grains, good sources of protein such as meat, eggs, or tofu, and low-fat dairy. Your health care provider will help you determine the amount of weight gain that is right for you.  Avoid raw meat and uncooked cheese. These carry germs that can cause birth defects in the baby.  If you have low calcium intake from food, talk to your health care provider about whether you should take a daily calcium supplement.  Limit foods that are high in fat and processed sugars, such as fried and sweet foods.  To prevent constipation: ? Drink enough  fluid to keep your urine clear or pale yellow. ? Eat foods that are high in fiber, such as fresh fruits and vegetables, whole grains, and beans. Activity  Exercise only as directed by your health care provider. Most women can continue their usual exercise routine during pregnancy. Try to exercise for 30 minutes at least 5 days a week. Stop exercising if you experience uterine contractions.  Avoid heavy lifting, wear low heel shoes, and practice good posture.  A sexual relationship may be continued unless your health care provider directs you otherwise. Relieving pain and discomfort  Wear a good support bra to prevent discomfort from breast tenderness.  Take warm sitz baths to soothe any pain or  discomfort caused by hemorrhoids. Use hemorrhoid cream if your health care provider approves.  Rest with your legs elevated if you have leg cramps or low back pain.  If you develop varicose veins, wear support hose. Elevate your feet for 15 minutes, 3-4 times a day. Limit salt in your diet. Prenatal Care  Write down your questions. Take them to your prenatal visits.  Keep all your prenatal visits as told by your health care provider. This is important. Safety  Wear your seat belt at all times when driving.  Make a list of emergency phone numbers, including numbers for family, friends, the hospital, and police and fire departments. General instructions  Ask your health care provider for a referral to a local prenatal education class. Begin classes no later than the beginning of month 6 of your pregnancy.  Ask for help if you have counseling or nutritional needs during pregnancy. Your health care provider can offer advice or refer you to specialists for help with various needs.  Do not use hot tubs, steam rooms, or saunas.  Do not douche or use tampons or scented sanitary pads.  Do not cross your legs for long periods of time.  Avoid cat litter boxes and soil used by cats. These carry germs that can cause birth defects in the baby and possibly loss of the fetus by miscarriage or stillbirth.  Avoid all smoking, herbs, alcohol, and unprescribed drugs. Chemicals in these products can affect the formation and growth of the baby.  Do not use any products that contain nicotine or tobacco, such as cigarettes and e-cigarettes. If you need help quitting, ask your health care provider.  Visit your dentist if you have not gone yet during your pregnancy. Use a soft toothbrush to brush your teeth and be gentle when you floss. Contact a health care provider if:  You have dizziness.  You have mild pelvic cramps, pelvic pressure, or nagging pain in the abdominal area.  You have persistent  nausea, vomiting, or diarrhea.  You have a bad smelling vaginal discharge.  You have pain when you urinate. Get help right away if:  You have a fever.  You are leaking fluid from your vagina.  You have spotting or bleeding from your vagina.  You have severe abdominal cramping or pain.  You have rapid weight gain or weight loss.  You have shortness of breath with chest pain.  You notice sudden or extreme swelling of your face, hands, ankles, feet, or legs.  You have not felt your baby move in over an hour.  You have severe headaches that do not go away when you take medicine.  You have vision changes. Summary  The second trimester is from week 14 through week 27 (months 4 through 6). It is also a time  when the fetus is growing rapidly.  Your body goes through many changes during pregnancy. The changes vary from woman to woman.  Avoid all smoking, herbs, alcohol, and unprescribed drugs. These chemicals affect the formation and growth your baby.  Do not use any tobacco products, such as cigarettes, chewing tobacco, and e-cigarettes. If you need help quitting, ask your health care provider.  Contact your health care provider if you have any questions. Keep all prenatal visits as told by your health care provider. This is important. This information is not intended to replace advice given to you by your health care provider. Make sure you discuss any questions you have with your health care provider.       CHILDBIRTH CLASSES [phone] is the phone number for Pregnancy Classes or hospital tours at [facility]   You will be referred to  [email or web address] for more information on childbirth classes  At this site you may register for classes. You may sign up for a waiting list if classes are full. Please SIGN UP FOR THIS!.   When the waiting list becomes long, sometimes new  classes can be added.

## 2017-04-24 NOTE — Progress Notes (Signed)
[id number] 1188w5d Estimated Date of Delivery: 10/04/17  Blood pressure 112/60, pulse 90, weight 178 lb (80.7 kg), last menstrual period 11/14/2016.   BP weight and urine results all reviewed and noted.  Please refer to the obstetrical flow sheet for the fundal height and fetal heart rate documentation:  Patient denies any bleeding and no rupture of membranes symptoms or regular contractions. Patient is without complaints. All questions were answered.  Orders Placed This Encounter  Procedures  . US OB Comp + 14 Wk  . INTEGRATED 2  . informaSeq(SM) with XY Analysis  . POCT Urinalysis Dipstick    Plan:  Continued routine obstetrical care,   Return in about 2 weeks (around 05/08/2017) for LROB, GE:[id number]:Anatomy.

## 2017-04-26 DIAGNOSIS — Z029 Encounter for administrative examinations, unspecified: Secondary | ICD-10-CM

## 2017-05-07 ENCOUNTER — Encounter: Payer: Self-pay | Admitting: Obstetrics & Gynecology

## 2017-05-07 ENCOUNTER — Ambulatory Visit (INDEPENDENT_AMBULATORY_CARE_PROVIDER_SITE_OTHER): Payer: 59 | Admitting: Obstetrics & Gynecology

## 2017-05-07 ENCOUNTER — Ambulatory Visit (INDEPENDENT_AMBULATORY_CARE_PROVIDER_SITE_OTHER): Payer: 59

## 2017-05-07 VITALS — BP 110/60 | HR 74 | Wt 178.0 lb

## 2017-05-07 DIAGNOSIS — Z363 Encounter for antenatal screening for malformations: Secondary | ICD-10-CM | POA: Diagnosis not present

## 2017-05-07 DIAGNOSIS — Z331 Pregnant state, incidental: Secondary | ICD-10-CM

## 2017-05-07 DIAGNOSIS — Z3A18 18 weeks gestation of pregnancy: Secondary | ICD-10-CM

## 2017-05-07 DIAGNOSIS — Z3482 Encounter for supervision of other normal pregnancy, second trimester: Secondary | ICD-10-CM

## 2017-05-07 DIAGNOSIS — Z3402 Encounter for supervision of normal first pregnancy, second trimester: Secondary | ICD-10-CM

## 2017-05-07 DIAGNOSIS — Z1389 Encounter for screening for other disorder: Secondary | ICD-10-CM

## 2017-05-07 LAB — POCT URINALYSIS DIPSTICK
GLUCOSE UA: NEGATIVE
KETONES UA: NEGATIVE
Leukocytes, UA: NEGATIVE
Nitrite, UA: NEGATIVE
Protein, UA: NEGATIVE
RBC UA: NEGATIVE

## 2017-05-07 NOTE — Progress Notes (Signed)
[id number] 8059w4d Estimated Date of Delivery: 10/04/17  Blood pressure 110/60, pulse 74, weight 178 lb (80.7 kg), last menstrual period 11/14/2016.   BP weight and urine results all reviewed and noted.  Please refer to the obstetrical flow sheet for the fundal height and fetal heart rate documentation:  Patient reports good fetal movement, denies any bleeding and no rupture of membranes symptoms or regular contractions. Patient is without complaints. All questions were answered.  Orders Placed This Encounter  Procedures  . POCT urinalysis dipstick    Plan:  Continued routine obstetrical care, sonogram is normal specifically the neural tube and abdominal wall, so no anatomical defect noted, will do growth and surveillance later in pregnancy due to elevated MSAFP MOM  Return in about 4 weeks (around 06/04/2017) for LROB.

## 2017-05-07 NOTE — Progress Notes (Signed)
US 18+4 wks,variable position,cx 6.6 cm,normal ovaries bilat,fundal pl gr 0,svp of fluid 3.4 cm,fhr 137 bpm, EFW 246 g,anatomy complete,no obvious abnormalities

## 2017-05-08 ENCOUNTER — Telehealth: Payer: Self-pay | Admitting: Obstetrics & Gynecology

## 2017-05-08 NOTE — Telephone Encounter (Signed)
Patient states dental office needs an additional letter stating her dental procedure is necessary and could be detrimental if she does not have it. Spoke to someone at the dental office who states if she is in pain, the tooth can be extracted and is necessary to be removed. Spoke to patient again and she states she is in pain. Tooth was supposed to be removed last year but stopped hurting but the pain has come back. Will get letter sent tomorrow.

## 2017-05-09 ENCOUNTER — Encounter: Payer: Self-pay | Admitting: Advanced Practice Midwife

## 2017-05-12 ENCOUNTER — Encounter (HOSPITAL_COMMUNITY): Payer: Self-pay | Admitting: *Deleted

## 2017-05-12 ENCOUNTER — Emergency Department (HOSPITAL_COMMUNITY)
Admission: EM | Admit: 2017-05-12 | Discharge: 2017-05-12 | Disposition: A | Discharge status: Home / Self Care | Payer: 59 | Attending: Emergency Medicine | Admitting: Emergency Medicine

## 2017-05-12 ENCOUNTER — Other Ambulatory Visit: Payer: Self-pay

## 2017-05-12 DIAGNOSIS — Z3A19 19 weeks gestation of pregnancy: Secondary | ICD-10-CM | POA: Diagnosis not present

## 2017-05-12 DIAGNOSIS — O3462 Maternal care for abnormality of vagina, second trimester: Secondary | ICD-10-CM | POA: Diagnosis present

## 2017-05-12 DIAGNOSIS — N898 Other specified noninflammatory disorders of vagina: Secondary | ICD-10-CM | POA: Insufficient documentation

## 2017-05-12 DIAGNOSIS — Z3490 Encounter for supervision of normal pregnancy, unspecified, unspecified trimester: Secondary | ICD-10-CM

## 2017-05-12 NOTE — ED Triage Notes (Addendum)
Pt reports she is [redacted] weeks pregnant and that tonight while taking a shower, she wiped her vagina with a dry washcloth and there was a quarter sized amount of "mucous" on the wash cloth. Pt has taken pictures and has not other complaints.

## 2017-05-12 NOTE — ED Provider Notes (Signed)
[facility] EMERGENCY DEPARTMENT Provider Note   CSN: [id number] Arrival date & time: 05/12/17  2110     History   Chief Complaint Chief Complaint  Patient presents with  . possible loss of mucous plug    HPI Erica [name] is a 21 y.o. female.  HPI patient reports she was showering tonight and she feels that she may have lost her mucous plug.  She is currently [redacted] weeks pregnant she states "I feel fine.  She denies any abdominal pain or cramping.  Pregnancy has been going normal until now.  [facility].  No other associated symptoms  Past Medical History:  Diagnosis Date  . Anemia   . Chlamydia 07/06/14  . Chlamydia infection 08/02/2014  . Chlamydia infection during pregnancy, antepartum 07/27/2014   Had +CHL 2/9 treated in hospital 2/19 will check POT 3/1  . Elevated BP 07/27/2014  . Ganglion cyst of wrist    left  . Pelvic pain in female 07/27/2014  . Pregnant 12/29/2014  . Screening for STD (sexually transmitted disease) 07/27/2014  . Sinus infection 12/29/2014  . Strep throat   . Vaginal discharge 12/29/2014  . Yeast infection 04/07/2013    Patient Active Problem List   Diagnosis Date Noted  . Elevated AFP 05/09/2017  . Supervision of normal pregnancy 02/27/2017  . Spotting affecting pregnancy in first trimester 02/05/2017  . Monilial vulvovaginitis 05/05/2015  . BV (bacterial vaginosis) 05/05/2015  . Sinus infection 12/29/2014  . Chlamydia 07/27/2014  . Pelvic pain in female 07/27/2014  . Sickle cell trait (HCC) 03/17/2013    Past Surgical History:  Procedure Laterality Date  . NO PAST SURGERIES      OB History    Gravida Para Term Preterm AB Living   4 2 2   1 2    SAB TAB Ectopic Multiple Live Births   1     0 2       Home Medications    Prior to Admission medications   Medication Sig Start Date End Date Taking? Authorizing Provider  OVER THE COUNTER MEDICATION Take 5 mLs by mouth daily as needed. Tylenol cold and flu   Yes [provider]   Prenatal Vit-Fe Fumarate-FA (PRENATAL VITAMINS) 28-0.8 MG TABS Take 1 daily 02/05/17  Yes Cyril MourningGriffin, Jennifer A, NP  terconazole (TERAZOL 7) 0.4 % vaginal cream Place 1 applicator vaginally at bedtime. X 7 nights Patient not taking: Reported on 04/24/2017 03/21/17   Cheral MarkerBooker, Kimberly R, CNM    Family History Family History  Problem Relation Age of Onset  . Diabetes Maternal Grandmother   . Diabetes Paternal Grandmother   . Multiple sclerosis Cousin     Social History Social History   Tobacco Use  . Smoking status: Never Smoker  . Smokeless tobacco: Never Used  Substance Use Topics  . Alcohol use: No  . Drug use: No     Allergies   Patient has no known allergies.   Review of Systems Review of Systems  Constitutional: Negative.   HENT: Negative.   Respiratory: Negative.   Cardiovascular: Negative.   Gastrointestinal: Negative.   Genitourinary: Positive for vaginal discharge.       Possible loss of mucous plug [redacted] weeks pregnant  Musculoskeletal: Negative.   Skin: Negative.   Neurological: Negative.   Psychiatric/Behavioral: Negative.   All other systems reviewed and are negative.    Physical Exam Updated Vital Signs BP 125/85 (BP Location: Right Arm)   Pulse 93   Temp 98.7  F (37.1 C) (Oral)   Resp 18   Ht 5\' 3"  (1.6 m)   Wt 80.7 kg (178 lb)   LMP 11/14/2016 (Exact Date)   SpO2 100%   BMI 31.53 kg/m   Physical Exam  Constitutional: She is oriented to person, place, and time. She appears well-developed and well-nourished.  HENT:  Head: Normocephalic and atraumatic.  Eyes: Conjunctivae are normal. Pupils are equal, round, and reactive to light.  Neck: Neck supple. No tracheal deviation present. No thyromegaly present.  Cardiovascular: Normal rate and regular rhythm.  No murmur heard. Pulmonary/Chest: Effort normal and breath sounds normal.  Abdominal: Soft. Bowel sounds are normal. She exhibits no distension. There is no tenderness.  Fetal heart tones  154  Genitourinary:  Genitourinary Comments: Pelvic exam performed with sterile gloves after perineum prepped with Betadine.  No external lesion.  Slight amount of whitish and clear mucousy discharge in vault.  Cervical loss closed.  No cervical motion tenderness.  No adnexal mass or tenderness.  No tenderness at uterine fundus  Musculoskeletal: Normal range of motion. She exhibits no edema or tenderness.  Neurological: She is alert and oriented to person, place, and time. No cranial nerve deficit. Coordination normal.  Skin: Skin is warm and dry. No rash noted.  Psychiatric: She has a normal mood and affect.  Nursing note and vitals reviewed.    ED Treatments / Results  Labs (all labs ordered are listed, but only abnormal results are displayed) Labs Reviewed - No data to display  EKG  EKG Interpretation None       Radiology No results found.  Procedures Procedures (including critical care time)  Medications Ordered in ED Medications - No data to display   Initial Impression / Assessment and Plan / ED Course  I have reviewed the triage vital signs and the nursing notes.  Pertinent labs & imaging results that were available during my care of the patient were reviewed by me and considered in my medical decision making (see chart for details).     No signs of labor.  Plan expected management.  home observation follow-up with [facility] as needed  Final Clinical Impressions(s) / ED Diagnoses  Dx pregnancy Final diagnoses:  None    ED Discharge Orders    None       Doug SouJacubowitz, Ej Pinson, MD 05/12/17 2218

## 2017-05-12 NOTE — Discharge Instructions (Signed)
see your doctors [facility] if you think you may be having problems with the pregnancy, or return here if concern for any reason.  If you think you may be in labor [facility]

## 2017-05-12 NOTE — ED Notes (Signed)
Pt G3P2 with no complications this pregnancy. States she has had constipation and was straining to have a BM, went to take a shower and states she "lost her mucous plug". Pt noted to have a thin, watery, clear d/c. Denies loosing her mucous plug in previous pregnancies. Denies abd pain, dysuria, or contractions.

## 2017-05-15 ENCOUNTER — Encounter: Payer: Self-pay | Admitting: *Deleted

## 2017-05-28 NOTE — L&D Delivery Note (Signed)
Delivery Note Erica [name] is a 22 y.o. [id number] at [redacted]w[redacted]d admitted for SROM.  Labor course: progressed w/o augmentation At 0411 a viable female was delivered via spontaneous vaginal delivery (Presentation: LOA).  Infant placed directly on mom's abdomen for bonding/skin-to-skin. Delayed cord clamping x , then cord clamped x 2, and cut by FOB.  APGAR: see delivery summary; weight: pending at time of note.  40 units of pitocin diluted in 1000cc LR was infused rapidly IV per protocol. The placenta separated spontaneously and delivered via CCT and maternal pushing effort.  It was inspected and appears to be intact with a 3 VC.  Placenta/Cord with the following complications: scattered calcifications .  Cord pH: not done  Intrapartum complications:  None Anesthesia:  epidural Episiotomy: none Lacerations:  none Suture Repair: n/a Est. Blood Loss (mL): 100 Sponge and instrument count were correct x2.  Mom to postpartum.  Baby to Couplet care / Skin to Skin. Placenta to L&D. Plans to breastfeed Contraception: nexplanon Circ:   Cheral Marker CNM, [facility] 10/02/2017 5:32 AM   Grace Bushy, [name], CNM  Noreene Larsson, RMA        Please schedule this patient for PP visit in: 4 weeks  Low risk pregnancy complicated by: none  Delivery mode: SVD  Anticipated Birth Control: undecided  PP Procedures needed: none  Schedule Integrated BH visit: no  Provider: with Joellyn Haff

## 2017-06-04 ENCOUNTER — Ambulatory Visit (INDEPENDENT_AMBULATORY_CARE_PROVIDER_SITE_OTHER): Payer: Medicaid Other | Admitting: Women's Health

## 2017-06-04 ENCOUNTER — Encounter: Payer: Self-pay | Admitting: Women's Health

## 2017-06-04 VITALS — BP 90/60 | HR 96 | Wt 178.6 lb

## 2017-06-04 DIAGNOSIS — O0990 Supervision of high risk pregnancy, unspecified, unspecified trimester: Secondary | ICD-10-CM

## 2017-06-04 DIAGNOSIS — O285 Abnormal chromosomal and genetic finding on antenatal screening of mother: Secondary | ICD-10-CM

## 2017-06-04 DIAGNOSIS — O368120 Decreased fetal movements, second trimester, not applicable or unspecified: Secondary | ICD-10-CM

## 2017-06-04 DIAGNOSIS — Z3A22 22 weeks gestation of pregnancy: Secondary | ICD-10-CM

## 2017-06-04 DIAGNOSIS — O0992 Supervision of high risk pregnancy, unspecified, second trimester: Secondary | ICD-10-CM

## 2017-06-04 DIAGNOSIS — O99012 Anemia complicating pregnancy, second trimester: Secondary | ICD-10-CM

## 2017-06-04 DIAGNOSIS — R772 Abnormality of alphafetoprotein: Secondary | ICD-10-CM

## 2017-06-04 DIAGNOSIS — D573 Sickle-cell trait: Secondary | ICD-10-CM

## 2017-06-04 DIAGNOSIS — Z331 Pregnant state, incidental: Secondary | ICD-10-CM

## 2017-06-04 DIAGNOSIS — Z1389 Encounter for screening for other disorder: Secondary | ICD-10-CM

## 2017-06-04 LAB — POCT URINALYSIS DIPSTICK
Blood, UA: NEGATIVE
Glucose, UA: NEGATIVE
Ketones, UA: NEGATIVE
Leukocytes, UA: NEGATIVE
NITRITE UA: NEGATIVE
PROTEIN UA: NEGATIVE

## 2017-06-04 NOTE — Patient Instructions (Addendum)
Erica [name], I greatly value your feedback.  If you receive a survey following your visit with us today, we appreciate you taking the time to fill it out.  Thanks, Joellyn HaffKim Dontray Haberland, CNM, WHNP-BC  Genetic counselor: Magdalene Mollyhurs 1/10 at 3pm, be there at 2:45pm   Second Trimester of Pregnancy The second trimester is from week 14 through week 27 (months 4 through 6). The second trimester is often a time when you feel your best. Your body has adjusted to being pregnant, and you begin to feel better physically. Usually, morning sickness has lessened or quit completely, you may have more energy, and you may have an increase in appetite. The second trimester is also a time when the fetus is growing rapidly. At the end of the sixth month, the fetus is about 9 inches long and weighs about 1 pounds. You will likely begin to feel the baby move (quickening) between 16 and 20 weeks of pregnancy. Body changes during your second trimester Your body continues to go through many changes during your second trimester. The changes vary from woman to woman.  Your weight will continue to increase. You will notice your lower abdomen bulging out.  You may begin to get stretch marks on your hips, abdomen, and breasts.  You may develop headaches that can be relieved by medicines. The medicines should be approved by your health care provider.  You may urinate more often because the fetus is pressing on your bladder.  You may develop or continue to have heartburn as a result of your pregnancy.  You may develop constipation because certain hormones are causing the muscles that push waste through your intestines to slow down.  You may develop hemorrhoids or swollen, bulging veins (varicose veins).  You may have back pain. This is caused by: ? Weight gain. ? Pregnancy hormones that are relaxing the joints in your pelvis. ? A shift in weight and the muscles that support your balance.  Your breasts will continue to grow  and they will continue to become tender.  Your gums may bleed and may be sensitive to brushing and flossing.  Dark spots or blotches (chloasma, mask of pregnancy) may develop on your face. This will likely fade after the baby is born.  A dark line from your belly button to the pubic area (linea nigra) may appear. This will likely fade after the baby is born.  You may have changes in your hair. These can include thickening of your hair, rapid growth, and changes in texture. Some women also have hair loss during or after pregnancy, or hair that feels dry or thin. Your hair will most likely return to normal after your baby is born.  What to expect at prenatal visits During a routine prenatal visit:  You will be weighed to make sure you and the fetus are growing normally.  Your blood pressure will be taken.  Your abdomen will be measured to track your baby's growth.  The fetal heartbeat will be listened to.  Any test results from the previous visit will be discussed.  Your health care provider may ask you:  How you are feeling.  If you are feeling the baby move.  If you have had any abnormal symptoms, such as leaking fluid, bleeding, severe headaches, or abdominal cramping.  If you are using any tobacco products, including cigarettes, chewing tobacco, and electronic cigarettes.  If you have any questions.  Other tests that may be performed during your second trimester include:  Blood  tests that check for: ? Low iron levels (anemia). ? High blood sugar that affects pregnant women (gestational diabetes) between 49 and 28 weeks. ? Rh antibodies. This is to check for a protein on red blood cells (Rh factor).  Urine tests to check for infections, diabetes, or protein in the urine.  An ultrasound to confirm the proper growth and development of the baby.  An amniocentesis to check for possible genetic problems.  Fetal screens for spina bifida and Down syndrome.  HIV (human  immunodeficiency virus) testing. Routine prenatal testing includes screening for HIV, unless you choose not to have this test.  Follow these instructions at home: Medicines  Follow your health care provider's instructions regarding medicine use. Specific medicines may be either safe or unsafe to take during pregnancy.  Take a prenatal vitamin that contains at least 600 micrograms (mcg) of folic acid.  If you develop constipation, try taking a stool softener if your health care provider approves. Eating and drinking  Eat a balanced diet that includes fresh fruits and vegetables, whole grains, good sources of protein such as meat, eggs, or tofu, and low-fat dairy. Your health care provider will help you determine the amount of weight gain that is right for you.  Avoid raw meat and uncooked cheese. These carry germs that can cause birth defects in the baby.  If you have low calcium intake from food, talk to your health care provider about whether you should take a daily calcium supplement.  Limit foods that are high in fat and processed sugars, such as fried and sweet foods.  To prevent constipation: ? Drink enough fluid to keep your urine clear or pale yellow. ? Eat foods that are high in fiber, such as fresh fruits and vegetables, whole grains, and beans. Activity  Exercise only as directed by your health care provider. Most women can continue their usual exercise routine during pregnancy. Try to exercise for 30 minutes at least 5 days a week. Stop exercising if you experience uterine contractions.  Avoid heavy lifting, wear low heel shoes, and practice good posture.  A sexual relationship may be continued unless your health care provider directs you otherwise. Relieving pain and discomfort  Wear a good support bra to prevent discomfort from breast tenderness.  Take warm sitz baths to soothe any pain or discomfort caused by hemorrhoids. Use hemorrhoid cream if your health care  provider approves.  Rest with your legs elevated if you have leg cramps or low back pain.  If you develop varicose veins, wear support hose. Elevate your feet for 15 minutes, 3-4 times a day. Limit salt in your diet. Prenatal Care  Write down your questions. Take them to your prenatal visits.  Keep all your prenatal visits as told by your health care provider. This is important. Safety  Wear your seat belt at all times when driving.  Make a list of emergency phone numbers, including numbers for family, friends, the hospital, and police and fire departments. General instructions  Ask your health care provider for a referral to a local prenatal education class. Begin classes no later than the beginning of month 6 of your pregnancy.  Ask for help if you have counseling or nutritional needs during pregnancy. Your health care provider can offer advice or refer you to specialists for help with various needs.  Do not use hot tubs, steam rooms, or saunas.  Do not douche or use tampons or scented sanitary pads.  Do not cross your legs for  long periods of time.  Avoid cat litter boxes and soil used by cats. These carry germs that can cause birth defects in the baby and possibly loss of the fetus by miscarriage or stillbirth.  Avoid all smoking, herbs, alcohol, and unprescribed drugs. Chemicals in these products can affect the formation and growth of the baby.  Do not use any products that contain nicotine or tobacco, such as cigarettes and e-cigarettes. If you need help quitting, ask your health care provider.  Visit your dentist if you have not gone yet during your pregnancy. Use a soft toothbrush to brush your teeth and be gentle when you floss. Contact a health care provider if:  You have dizziness.  You have mild pelvic cramps, pelvic pressure, or nagging pain in the abdominal area.  You have persistent nausea, vomiting, or diarrhea.  You have a bad smelling vaginal  discharge.  You have pain when you urinate. Get help right away if:  You have a fever.  You are leaking fluid from your vagina.  You have spotting or bleeding from your vagina.  You have severe abdominal cramping or pain.  You have rapid weight gain or weight loss.  You have shortness of breath with chest pain.  You notice sudden or extreme swelling of your face, hands, ankles, feet, or legs.  You have not felt your baby move in over an hour.  You have severe headaches that do not go away when you take medicine.  You have vision changes. Summary  The second trimester is from week 14 through week 27 (months 4 through 6). It is also a time when the fetus is growing rapidly.  Your body goes through many changes during pregnancy. The changes vary from woman to woman.  Avoid all smoking, herbs, alcohol, and unprescribed drugs. These chemicals affect the formation and growth your baby.  Do not use any tobacco products, such as cigarettes, chewing tobacco, and e-cigarettes. If you need help quitting, ask your health care provider.  Contact your health care provider if you have any questions. Keep all prenatal visits as told by your health care provider. This is important. This information is not intended to replace advice given to you by your health care provider. Make sure you discuss any questions you have with your health care provider. Document Released: 05/08/2001 Document Revised: 10/20/2015 Document Reviewed: 07/15/2012 Elsevier Interactive Patient Education  2017 [employer].

## 2017-06-04 NOTE — Progress Notes (Signed)
HIGH-RISK PREGNANCY VISIT Patient name: Erica [name] Emry MRN [id number]  Date of birth: 1995-10-08 Chief Complaint:   Routine Prenatal Visit (havn'[name] felt baby move since Sunday)  History of Present Illness:   Erica [name] Salminen is a 22 y.o. [id number] female at 4660w4d with an Estimated Date of Delivery: 10/04/17 being seen today for ongoing management of a high-risk pregnancy complicated by increased r/f OSB 1:120, AFP 2.56.  Today she reports no fm x 2 days. Contractions: Not present.  .  Movement: (!) Decreased. denies leaking of fluid.  Review of Systems:   Pertinent items are noted in HPI Denies abnormal vaginal discharge w/ itching/odor/irritation, headaches, visual changes, shortness of breath, chest pain, abdominal pain, severe nausea/vomiting, or problems with urination or bowel movements unless otherwise stated above. Pertinent History Reviewed:  Reviewed past medical,surgical, social, obstetrical and family history.  Reviewed problem list, medications and allergies. Physical Assessment:   Vitals:   06/04/17 0849  BP: 90/60  Pulse: 96  Weight: 178 lb 9.6 oz (81 kg)  Body mass index is 31.64 kg/m.           Physical Examination:   General appearance: alert, well appearing, and in no distress  Mental status: alert, oriented to person, place, and time  Skin: warm & dry   Extremities: Edema: None    Cardiovascular: normal heart rate noted  Respiratory: normal respiratory effort, no distress  Abdomen: gravid, soft, non-tender  Pelvic: Cervical exam deferred         Fetal Status: Fetal Heart Rate (bpm): 148 Fundal Height: 22 cm Movement: (!) Decreased    Fetal Surveillance Testing today: FHT doppler   Results for orders placed or performed in visit on 06/04/17 (from the past 24 hour(s))  POCT urinalysis dipstick   Collection Time: 06/04/17  8:59 AM  Result Value Ref Range   Color, UA     Clarity, UA     Glucose, UA neg    Bilirubin, UA     Ketones, UA neg    Spec  Grav, UA  1.010 - 1.025   Blood, UA neg    pH, UA  5.0 - 8.0   Protein, UA neg    Urobilinogen, UA  0.2 or 1.0 E.U./dL   Nitrite, UA neg    Leukocytes, UA Negative Negative   Appearance     Odor      Assessment & Plan:  1) High-risk pregnancy [id number] at 1260w4d with an Estimated Date of Delivery: 10/04/17   2) Elevated AFP w/ increased r/f OSB 1:120, however when reviewing results on 2nd IT it was entered she was IDDM, which she is not, info given to Tish to call [facility] to have them re-run w/ correct info. Will call her w/ results when they return  3) Decreased fm, good FHT, discussed importance of awareness of fm/seeking care  4) Sickle cell trait> will check urine cx today  Meds: No orders of the defined types were placed in this encounter.   Labs/procedures today: none  Treatment Plan:  Will depend on result of re-run of IT, if still abnormal pt desires genetic counseling referral and will plan growth U/S @  24, 28, 32, 36wks     2x/wk testing @ 32wks    IOL @ 39wks   Reviewed: Preterm labor symptoms and general obstetric precautions including but not limited to vaginal bleeding, contractions, leaking of fluid and fetal movement were reviewed in detail with the patient.  All questions were answered.  Follow-up: Return in about 2 weeks (around 06/18/2017) for HROB, US:EFW.  Orders Placed This Encounter  Procedures  . Urine Culture  . POCT urinalysis dipstick   Marge Duncans CNM, [facility] 06/04/2017 1:23 PM

## 2017-06-05 ENCOUNTER — Telehealth: Payer: Self-pay | Admitting: *Deleted

## 2017-06-05 LAB — INTEGRATED 1
Crown Rump Length: 64.4 mm
GEST. AGE ON COLLECTION DATE: 12.7 wk
MATERNAL AGE AT EDD: 23.5 a
NUCHAL TRANSLUCENCY (NT): 1.3 mm
Number of Fetuses: 1
PAPP-A VALUE: 243.1 ng/mL
WEIGHT: 175 [lb_av]

## 2017-06-05 LAB — INTEGRATED 2
AFP MARKER: 69.9 ng/mL
AFP MOM: 2.05
CROWN RUMP LENGTH: 64.4 mm
DIA MoM: 0.63
DIA Value: 95.5 pg/mL
ESTRIOL UNCONJUGATED: 1 ng/mL
GEST. AGE ON COLLECTION DATE: 12.7 wk
GESTATIONAL AGE: 17 wk
HCG VALUE: 18.2 [IU]/mL
MATERNAL AGE AT EDD: 23.5 a
Nuchal Translucency (NT): 1.3 mm
Nuchal Translucency MoM: 0.8
Number of Fetuses: 1
PAPP-A MoM: 0.29
PAPP-A Value: 243.1 ng/mL
TEST RESULTS: NEGATIVE
WEIGHT: 175 [lb_av]
Weight: 175 [lb_av]
hCG MoM: 0.72
uE3 MoM: 1.06

## 2017-06-05 NOTE — Telephone Encounter (Signed)
Called [facility] to have recalculation on 2nd IT since it was entered that she was diabetic and she is not.  After recalculation, patient is now not at risk.  Kim made aware and will notify patient.

## 2017-06-06 ENCOUNTER — Encounter (HOSPITAL_COMMUNITY): Payer: 59

## 2017-06-06 ENCOUNTER — Telehealth: Payer: Self-pay | Admitting: Women's Health

## 2017-06-06 LAB — URINE CULTURE: Organism ID, Bacteria: NO GROWTH

## 2017-06-06 NOTE — Telephone Encounter (Signed)
Pt returned call. Notified her IT results NEG after re-calculated w/ NO for IDDM. No longer high-risk, doesn't need u/s and antenatal testing, etc. So will cancel scheduled appt for 2wks from now, and re-schedule for 4wks for visit/PN2.  Cheral MarkerKimberly R. Kymorah Korf, CNM,  [facility]NP-[facility] 06/06/2017 9:19 AM

## 2017-06-06 NOTE — Telephone Encounter (Signed)
LM for pt to return call.  Cheral MarkerKimberly R. Orell Hurtado, CNM, WHNP-BC 06/06/2017 9:06 AM

## 2017-06-21 ENCOUNTER — Encounter: Payer: Medicaid Other | Admitting: Women's Health

## 2017-06-21 ENCOUNTER — Other Ambulatory Visit: Payer: Medicaid Other

## 2017-07-04 ENCOUNTER — Encounter: Payer: Self-pay | Admitting: Women's Health

## 2017-07-04 ENCOUNTER — Ambulatory Visit (INDEPENDENT_AMBULATORY_CARE_PROVIDER_SITE_OTHER): Payer: Medicaid Other | Admitting: Women's Health

## 2017-07-04 ENCOUNTER — Encounter: Payer: Medicaid Other | Admitting: Women's Health

## 2017-07-04 VITALS — BP 100/60 | HR 97 | Temp 98.4°F | Wt 183.8 lb

## 2017-07-04 DIAGNOSIS — O0990 Supervision of high risk pregnancy, unspecified, unspecified trimester: Secondary | ICD-10-CM

## 2017-07-04 DIAGNOSIS — Z131 Encounter for screening for diabetes mellitus: Secondary | ICD-10-CM

## 2017-07-04 DIAGNOSIS — Z3482 Encounter for supervision of other normal pregnancy, second trimester: Secondary | ICD-10-CM

## 2017-07-04 DIAGNOSIS — Z3A26 26 weeks gestation of pregnancy: Secondary | ICD-10-CM

## 2017-07-04 DIAGNOSIS — Z1389 Encounter for screening for other disorder: Secondary | ICD-10-CM

## 2017-07-04 DIAGNOSIS — R350 Frequency of micturition: Secondary | ICD-10-CM

## 2017-07-04 DIAGNOSIS — O9989 Other specified diseases and conditions complicating pregnancy, childbirth and the puerperium: Secondary | ICD-10-CM

## 2017-07-04 DIAGNOSIS — R8271 Bacteriuria: Secondary | ICD-10-CM

## 2017-07-04 DIAGNOSIS — Z331 Pregnant state, incidental: Secondary | ICD-10-CM

## 2017-07-04 LAB — POCT URINALYSIS DIPSTICK
Blood, UA: NEGATIVE
Glucose, UA: NEGATIVE
KETONES UA: NEGATIVE
Leukocytes, UA: NEGATIVE
PROTEIN UA: NEGATIVE

## 2017-07-04 MED ORDER — NITROFURANTOIN MONOHYD MACRO 100 MG PO CAPS: 100.0000 mg | ORAL_CAPSULE | Freq: Two times a day (BID) | ORAL | 0 refills | Status: DC

## 2017-07-04 NOTE — Patient Instructions (Signed)
Erica [name], I greatly value your feedback.  If you receive a survey following your visit with us today, we appreciate you taking the time to fill it out.  Thanks, Joellyn HaffKim Carolin Quang, CNM, WHNP-BC   Call the office [phone]) or go to [facility] if:  You begin to have strong, frequent contractions  Your water breaks.  Sometimes it is a big gush of fluid, sometimes it is just a trickle that keeps getting your panties wet or running down your legs  You have vaginal bleeding.  It is normal to have a small amount of spotting if your cervix was checked.   You don't feel your baby moving like normal.  If you don't, get you something to eat and drink and lay down and focus on feeling your baby move.  You should feel at least 10 movements in 2 hours.  If you don't, you should call the office or go to [facility].    Tdap Vaccine  It is recommended that you get the Tdap vaccine during the third trimester of EACH pregnancy to help protect your baby from getting pertussis (whooping cough)  27-36 weeks is the BEST time to do this so that you can pass the protection on to your baby. During pregnancy is better than after pregnancy, but if you are unable to get it during pregnancy it will be offered at the hospital.   You can get this vaccine at the health department or your family doctor  Everyone who will be around your baby should also be up-to-date on their vaccines. Adults (who are not pregnant) only need 1 dose of Tdap during adulthood.   Third Trimester of Pregnancy The third trimester is from week 29 through week 42, months 7 through 9. The third trimester is a time when the fetus is growing rapidly. At the end of the ninth month, the fetus is about 20 inches in length and weighs 6-10 pounds.  BODY CHANGES Your body goes through many changes during pregnancy. The changes vary from woman to woman.   Your weight will continue to increase. You can expect to gain 25-35 pounds (11-16 kg)  by the end of the pregnancy.  You may begin to get stretch marks on your hips, abdomen, and breasts.  You may urinate more often because the fetus is moving lower into your pelvis and pressing on your bladder.  You may develop or continue to have heartburn as a result of your pregnancy.  You may develop constipation because certain hormones are causing the muscles that push waste through your intestines to slow down.  You may develop hemorrhoids or swollen, bulging veins (varicose veins).  You may have pelvic pain because of the weight gain and pregnancy hormones relaxing your joints between the bones in your pelvis. Backaches may result from overexertion of the muscles supporting your posture.  You may have changes in your hair. These can include thickening of your hair, rapid growth, and changes in texture. Some women also have hair loss during or after pregnancy, or hair that feels dry or thin. Your hair will most likely return to normal after your baby is born.  Your breasts will continue to grow and be tender. A yellow discharge may leak from your breasts called colostrum.  Your belly button may stick out.  You may feel short of breath because of your expanding uterus.  You may notice the fetus "dropping," or moving lower in your abdomen.  You may have a bloody  mucus discharge. This usually occurs a few days to a week before labor begins.  Your cervix becomes thin and soft (effaced) near your due date. WHAT TO EXPECT AT YOUR PRENATAL EXAMS  You will have prenatal exams every 2 weeks until week 36. Then, you will have weekly prenatal exams. During a routine prenatal visit:  You will be weighed to make sure you and the fetus are growing normally.  Your blood pressure is taken.  Your abdomen will be measured to track your baby's growth.  The fetal heartbeat will be listened to.  Any test results from the previous visit will be discussed.  You may have a cervical check near  your due date to see if you have effaced. At around 36 weeks, your caregiver will check your cervix. At the same time, your caregiver will also perform a test on the secretions of the vaginal tissue. This test is to determine if a type of bacteria, Group B streptococcus, is present. Your caregiver will explain this further. Your caregiver may ask you:  What your birth plan is.  How you are feeling.  If you are feeling the baby move.  If you have had any abnormal symptoms, such as leaking fluid, bleeding, severe headaches, or abdominal cramping.  If you have any questions. Other tests or screenings that may be performed during your third trimester include:  Blood tests that check for low iron levels (anemia).  Fetal testing to check the health, activity level, and growth of the fetus. Testing is done if you have certain medical conditions or if there are problems during the pregnancy. FALSE LABOR You may feel small, irregular contractions that eventually go away. These are called Braxton Hicks contractions, or false labor. Contractions may last for hours, days, or even weeks before true labor sets in. If contractions come at regular intervals, intensify, or become painful, it is best to be seen by your caregiver.  SIGNS OF LABOR   Menstrual-like cramps.  Contractions that are 5 minutes apart or less.  Contractions that start on the top of the uterus and spread down to the lower abdomen and back.  A sense of increased pelvic pressure or back pain.  A watery or bloody mucus discharge that comes from the vagina. If you have any of these signs before the 37th week of pregnancy, call your caregiver right away. You need to go to the hospital to get checked immediately. HOME CARE INSTRUCTIONS   Avoid all smoking, herbs, alcohol, and unprescribed drugs. These chemicals affect the formation and growth of the baby.  Follow your caregiver's instructions regarding medicine use. There are  medicines that are either safe or unsafe to take during pregnancy.  Exercise only as directed by your caregiver. Experiencing uterine cramps is a good sign to stop exercising.  Continue to eat regular, healthy meals.  Wear a good support bra for breast tenderness.  Do not use hot tubs, steam rooms, or saunas.  Wear your seat belt at all times when driving.  Avoid raw meat, uncooked cheese, cat litter boxes, and soil used by cats. These carry germs that can cause birth defects in the baby.  Take your prenatal vitamins.  Try taking a stool softener (if your caregiver approves) if you develop constipation. Eat more high-fiber foods, such as fresh vegetables or fruit and whole grains. Drink plenty of fluids to keep your urine clear or pale yellow.  Take warm sitz baths to soothe any pain or discomfort caused by hemorrhoids.  Use hemorrhoid cream if your caregiver approves.  If you develop varicose veins, wear support hose. Elevate your feet for 15 minutes, 3-4 times a day. Limit salt in your diet.  Avoid heavy lifting, wear low heal shoes, and practice good posture.  Rest a lot with your legs elevated if you have leg cramps or low back pain.  Visit your dentist if you have not gone during your pregnancy. Use a soft toothbrush to brush your teeth and be gentle when you floss.  A sexual relationship may be continued unless your caregiver directs you otherwise.  Do not travel far distances unless it is absolutely necessary and only with the approval of your caregiver.  Take prenatal classes to understand, practice, and ask questions about the labor and delivery.  Make a trial run to the hospital.  Pack your hospital bag.  Prepare the baby's nursery.  Continue to go to all your prenatal visits as directed by your caregiver. SEEK MEDICAL CARE IF:  You are unsure if you are in labor or if your water has broken.  You have dizziness.  You have mild pelvic cramps, pelvic pressure, or  nagging pain in your abdominal area.  You have persistent nausea, vomiting, or diarrhea.  You have a bad smelling vaginal discharge.  You have pain with urination. SEEK IMMEDIATE MEDICAL CARE IF:   You have a fever.  You are leaking fluid from your vagina.  You have spotting or bleeding from your vagina.  You have severe abdominal cramping or pain.  You have rapid weight loss or gain.  You have shortness of breath with chest pain.  You notice sudden or extreme swelling of your face, hands, ankles, feet, or legs.  You have not felt your baby move in over an hour.  You have severe headaches that do not go away with medicine.  You have vision changes. Document Released: 05/08/2001 Document Revised: 05/19/2013 Document Reviewed: 07/15/2012 [facility] Patient Information 2015 [place], [place]. This information is not intended to replace advice given to you by your health care provider. Make sure you discuss any questions you have with your health care provider.

## 2017-07-04 NOTE — Progress Notes (Signed)
LOW-RISK PREGNANCY VISIT Patient name: Erica HailDanielle T Kloepfer MRN [id number]  Date of birth: [birth year] Chief Complaint:   No chief complaint on file.  History of Present Illness:   Erica [name] is a 22 y.o. [phone] female at 4982w6d with an Estimated Date of Delivery: 10/04/17 being seen today for ongoing management of a low-risk pregnancy.  Today she reports no complaints. Genetic screening originally showed increased r/f OSB 1:120 w/ elevated AFP, however it calculated based on IDDM, and she is not, so after re-calculation all results were normal. Pt is no longer considered high-risk.  Contractions: Not present.  .  Movement: Present. denies leaking of fluid. Review of Systems:   Pertinent items are noted in HPI Denies abnormal vaginal discharge w/ itching/odor/irritation, headaches, visual changes, shortness of breath, chest pain, abdominal pain, severe nausea/vomiting, or problems with urination or bowel movements unless otherwise stated above. Pertinent History Reviewed:  Reviewed past medical,surgical, social, obstetrical and family history.  Reviewed problem list, medications and allergies. Physical Assessment:   Vitals:   07/04/17 0952  BP: 100/60  Pulse: 97  Temp: 98.4 F (36.9 C)  Weight: 183 lb 12.8 oz (83.4 kg)  Body mass index is 32.56 kg/m.        Physical Examination:   General appearance: Well appearing, and in no distress  Mental status: Alert, oriented to person, place, and time  Skin: Warm & dry  Cardiovascular: Normal heart rate noted  Respiratory: Normal respiratory effort, no distress  Abdomen: Soft, gravid, nontender  Pelvic: Cervical exam deferred         Extremities: Edema: Trace  Fetal Status: Fetal Heart Rate (bpm): 141 Fundal Height: 27 cm Movement: Present    Results for orders placed or performed in visit on 07/04/17 (from the past 24 hour(s))  POCT urinalysis dipstick   Collection Time: 07/04/17 10:01 AM  Result Value Ref Range   Color,  UA     Clarity, UA     Glucose, UA neg    Bilirubin, UA     Ketones, UA neg    Spec Grav, UA  1.010 - 1.025   Blood, UA neg    pH, UA  5.0 - 8.0   Protein, UA neg    Urobilinogen, UA  0.2 or 1.0 E.U./dL   Nitrite, UA postive    Leukocytes, UA Negative Negative   Appearance     Odor      Assessment & Plan:  1) Low-risk pregnancy [id number] at 4082w6d with an Estimated Date of Delivery: 10/04/17   2) Asymptomatic bacteruria, rx macrobid, send urine cx   Meds:  Meds ordered this encounter  Medications  . nitrofurantoin, macrocrystal-monohydrate, (MACROBID) 100 MG capsule    Sig: Take 1 capsule (100 mg total) by mouth 2 (two) times daily. X 7 days    Dispense:  14 capsule    Refill:  0    Order Specific Question:   Supervising Provider    Answer:   Duane LopeEURE, LUTHER H [[id number]]   Labs/procedures today: pn2  Plan:  Continue routine obstetrical care   Reviewed: Preterm labor symptoms and general obstetric precautions including but not limited to vaginal bleeding, contractions, leaking of fluid and fetal movement were reviewed in detail with the patient. Recommended Tdap at HD/PCP per CDC guidelines.  All questions were answered  Follow-up: Return in about 4 weeks (around 08/01/2017) for LROB.  Orders Placed This Encounter  Procedures  . Urine Culture  . POCT urinalysis dipstick   Grace BushyBooker,  Cheron Every CNM, [facility] 07/04/2017 10:25 AM

## 2017-07-05 LAB — CBC
HEMATOCRIT: 36.7 % (ref 34.0–46.6)
HEMOGLOBIN: 12 g/dL (ref 11.1–15.9)
MCH: 27 pg (ref 26.6–33.0)
MCHC: 32.7 g/dL (ref 31.5–35.7)
MCV: 83 fL (ref 79–97)
Platelets: 219 10*3/uL (ref 150–379)
RBC: 4.44 x10E6/uL (ref 3.77–5.28)
RDW: 13.8 % (ref 12.3–15.4)
WBC: 9 10*3/uL (ref 3.4–10.8)

## 2017-07-05 LAB — GLUCOSE TOLERANCE, 2 HOURS W/ 1HR
GLUCOSE, 2 HOUR: 122 mg/dL (ref 65–152)
GLUCOSE, FASTING: 76 mg/dL (ref 65–91)
Glucose, 1 hour: 148 mg/dL (ref 65–179)

## 2017-07-05 LAB — RPR: RPR: NONREACTIVE

## 2017-07-05 LAB — ANTIBODY SCREEN: Antibody Screen: NEGATIVE

## 2017-07-05 LAB — HIV ANTIBODY (ROUTINE TESTING W REFLEX): HIV SCREEN 4TH GENERATION: NONREACTIVE

## 2017-07-06 LAB — URINE CULTURE

## 2017-07-08 NOTE — Progress Notes (Signed)
This encounter was created in error - please disregard.

## 2017-08-02 ENCOUNTER — Other Ambulatory Visit: Payer: Self-pay

## 2017-08-02 ENCOUNTER — Ambulatory Visit (INDEPENDENT_AMBULATORY_CARE_PROVIDER_SITE_OTHER): Payer: Medicaid Other | Admitting: Obstetrics & Gynecology

## 2017-08-02 ENCOUNTER — Encounter: Payer: Self-pay | Admitting: Obstetrics & Gynecology

## 2017-08-02 VITALS — BP 94/64 | HR 116 | Wt 187.0 lb

## 2017-08-02 DIAGNOSIS — Z331 Pregnant state, incidental: Secondary | ICD-10-CM

## 2017-08-02 DIAGNOSIS — Z3483 Encounter for supervision of other normal pregnancy, third trimester: Secondary | ICD-10-CM

## 2017-08-02 DIAGNOSIS — Z3A31 31 weeks gestation of pregnancy: Secondary | ICD-10-CM

## 2017-08-02 DIAGNOSIS — Z1389 Encounter for screening for other disorder: Secondary | ICD-10-CM

## 2017-08-02 LAB — POCT URINALYSIS DIPSTICK
Blood, UA: NEGATIVE
Glucose, UA: NEGATIVE
Leukocytes, UA: NEGATIVE
Nitrite, UA: NEGATIVE
Protein, UA: NEGATIVE

## 2017-08-02 NOTE — Progress Notes (Signed)
[id number] 5440w0d Estimated Date of Delivery: 10/04/17  Blood pressure 94/64, pulse (!) 116, weight 187 lb (84.8 kg), last menstrual period 11/14/2016.   BP weight and urine results all reviewed and noted.  Please refer to the obstetrical flow sheet for the fundal height and fetal heart rate documentation:  Patient reports good fetal movement, denies any bleeding and no rupture of membranes symptoms or regular contractions. Patient is without complaints. All questions were answered.  Orders Placed This Encounter  Procedures  . POCT urinalysis dipstick    Plan:  Continued routine obstetrical care,   Return in about 2 weeks (around 08/16/2017).

## 2017-08-09 ENCOUNTER — Telehealth: Payer: Self-pay | Admitting: Obstetrics & Gynecology

## 2017-08-09 NOTE — Telephone Encounter (Signed)
Advised patient to bring forms to next visit and discuss with provider regarding coming out of work. Verbalized understanding.

## 2017-08-13 ENCOUNTER — Other Ambulatory Visit: Payer: Self-pay

## 2017-08-13 ENCOUNTER — Encounter: Payer: Self-pay | Admitting: Advanced Practice Midwife

## 2017-08-13 ENCOUNTER — Ambulatory Visit (INDEPENDENT_AMBULATORY_CARE_PROVIDER_SITE_OTHER): Payer: Medicaid Other | Admitting: Advanced Practice Midwife

## 2017-08-13 VITALS — BP 108/66 | HR 87 | Wt 193.0 lb

## 2017-08-13 DIAGNOSIS — Z3483 Encounter for supervision of other normal pregnancy, third trimester: Secondary | ICD-10-CM

## 2017-08-13 DIAGNOSIS — Z1389 Encounter for screening for other disorder: Secondary | ICD-10-CM

## 2017-08-13 DIAGNOSIS — Z331 Pregnant state, incidental: Secondary | ICD-10-CM

## 2017-08-13 DIAGNOSIS — Z3A32 32 weeks gestation of pregnancy: Secondary | ICD-10-CM

## 2017-08-13 LAB — POCT URINALYSIS DIPSTICK
GLUCOSE UA: NEGATIVE
Ketones, UA: NEGATIVE
LEUKOCYTES UA: NEGATIVE
Nitrite, UA: NEGATIVE
Protein, UA: NEGATIVE
RBC UA: NEGATIVE

## 2017-08-13 NOTE — Patient Instructions (Addendum)
[name], I greatly value your feedback.  If you receive a survey following your visit with [place] today, we appreciate you taking the time to fill it out.  Thanks, Cathie Beams, CNM   Call the office [phone]) or go to [facility] if:  You begin to have strong, frequent contractions  Your water breaks.  Sometimes it is a big gush of fluid, sometimes it is just a trickle that keeps getting your panties wet or running down your legs  You have vaginal bleeding.  It is normal to have a small amount of spotting if your cervix was checked.   You don't feel your baby moving like normal.  If you don't, get you something to eat and drink and lay down and focus on feeling your baby move.  You should feel at least 10 movements in 2 hours.  If you don't, you should call the office or go to [facility].    Tdap Vaccine  It is recommended that you get the Tdap vaccine during the third trimester of EACH pregnancy to help protect your baby from getting pertussis (whooping cough)  27-36 weeks is the BEST time to do this so that you can pass the protection on to your baby. During pregnancy is better than after pregnancy, but if you are unable to get it during pregnancy it will be offered at the hospital.   You can get this vaccine at the health department or your family doctor  Everyone who will be around your baby should also be up-to-date on their vaccines. Adults (who are not pregnant) only need 1 dose of Tdap during adulthood.   Third Trimester of Pregnancy The third trimester is from week 29 through week 42, months 7 through 9. The third trimester is a time when the fetus is growing rapidly. At the end of the ninth month, the fetus is about 20 inches in length and weighs 6-10 pounds.  BODY CHANGES Your body goes through many changes during pregnancy. The changes vary from woman to woman.   Your weight will continue to increase. You can expect to gain 25-35 pounds (11-16  kg) by the end of the pregnancy.  You may begin to get stretch marks on your hips, abdomen, and breasts.  You may urinate more often because the fetus is moving lower into your pelvis and pressing on your bladder.  You may develop or continue to have heartburn as a result of your pregnancy.  You may develop constipation because certain hormones are causing the muscles that push waste through your intestines to slow down.  You may develop hemorrhoids or swollen, bulging veins (varicose veins).  You may have pelvic pain because of the weight gain and pregnancy hormones relaxing your joints between the bones in your pelvis. Backaches may result from overexertion of the muscles supporting your posture.  You may have changes in your hair. These can include thickening of your hair, rapid growth, and changes in texture. Some women also have hair loss during or after pregnancy, or hair that feels dry or thin. Your hair will most likely return to normal after your baby is born.  Your breasts will continue to grow and be tender. A yellow discharge may leak from your breasts called colostrum.  Your belly button may stick out.  You may feel short of breath because of your expanding uterus.  You may notice the fetus "dropping," or moving lower in your abdomen.  You may have a bloody mucus  discharge. This usually occurs a few days to a week before labor begins.  Your cervix becomes thin and soft (effaced) near your due date. WHAT TO EXPECT AT YOUR PRENATAL EXAMS  You will have prenatal exams every 2 weeks until week 36. Then, you will have weekly prenatal exams. During a routine prenatal visit:  You will be weighed to make sure you and the fetus are growing normally.  Your blood pressure is taken.  Your abdomen will be measured to track your baby's growth.  The fetal heartbeat will be listened to.  Any test results from the previous visit will be discussed.  You may have a cervical check  near your due date to see if you have effaced. At around 36 weeks, your caregiver will check your cervix. At the same time, your caregiver will also perform a test on the secretions of the vaginal tissue. This test is to determine if a type of bacteria, Group B streptococcus, is present. Your caregiver will explain this further. Your caregiver may ask you:  What your birth plan is.  How you are feeling.  If you are feeling the baby move.  If you have had any abnormal symptoms, such as leaking fluid, bleeding, severe headaches, or abdominal cramping.  If you have any questions. Other tests or screenings that may be performed during your third trimester include:  Blood tests that check for low iron levels (anemia).  Fetal testing to check the health, activity level, and growth of the fetus. Testing is done if you have certain medical conditions or if there are problems during the pregnancy. FALSE LABOR You may feel small, irregular contractions that eventually go away. These are called Braxton Hicks contractions, or false labor. Contractions may last for hours, days, or even weeks before true labor sets in. If contractions come at regular intervals, intensify, or become painful, it is best to be seen by your caregiver.  SIGNS OF LABOR   Menstrual-like cramps.  Contractions that are 5 minutes apart or less.  Contractions that start on the top of the uterus and spread down to the lower abdomen and back.  A sense of increased pelvic pressure or back pain.  A watery or bloody mucus discharge that comes from the vagina. If you have any of these signs before the 37th week of pregnancy, call your caregiver right away. You need to go to the hospital to get checked immediately. HOME CARE INSTRUCTIONS   Avoid all smoking, herbs, alcohol, and unprescribed drugs. These chemicals affect the formation and growth of the baby.  Follow your caregiver's instructions regarding medicine use. There are  medicines that are either safe or unsafe to take during pregnancy.  Exercise only as directed by your caregiver. Experiencing uterine cramps is a good sign to stop exercising.  Continue to eat regular, healthy meals.  Wear a good support bra for breast tenderness.  Do not use hot tubs, steam rooms, or saunas.  Wear your seat belt at all times when driving.  Avoid raw meat, uncooked cheese, cat litter boxes, and soil used by cats. These carry germs that can cause birth defects in the baby.  Take your prenatal vitamins.  Try taking a stool softener (if your caregiver approves) if you develop constipation. Eat more high-fiber foods, such as fresh vegetables or fruit and whole grains. Drink plenty of fluids to keep your urine clear or pale yellow.  Take warm sitz baths to soothe any pain or discomfort caused by hemorrhoids. Use  hemorrhoid cream if your caregiver approves.  If you develop varicose veins, wear support hose. Elevate your feet for 15 minutes, 3-4 times a day. Limit salt in your diet.  Avoid heavy lifting, wear low heal shoes, and practice good posture.  Rest a lot with your legs elevated if you have leg cramps or low back pain.  Visit your dentist if you have not gone during your pregnancy. Use a soft toothbrush to brush your teeth and be gentle when you floss.  A sexual relationship may be continued unless your caregiver directs you otherwise.  Do not travel far distances unless it is absolutely necessary and only with the approval of your caregiver.  Take prenatal classes to understand, practice, and ask questions about the labor and delivery.  Make a trial run to the hospital.  Pack your hospital bag.  Prepare the baby's nursery.  Continue to go to all your prenatal visits as directed by your caregiver. SEEK MEDICAL CARE IF:  You are unsure if you are in labor or if your water has broken.  You have dizziness.  You have mild pelvic cramps, pelvic pressure, or  nagging pain in your abdominal area.  You have persistent nausea, vomiting, or diarrhea.  You have a bad smelling vaginal discharge.  You have pain with urination. SEEK IMMEDIATE MEDICAL CARE IF:   You have a fever.  You are leaking fluid from your vagina.  You have spotting or bleeding from your vagina.  You have severe abdominal cramping or pain.  You have rapid weight loss or gain.  You have shortness of breath with chest pain.  You notice sudden or extreme swelling of your face, hands, ankles, feet, or legs.  You have not felt your baby move in over an hour.  You have severe headaches that do not go away with medicine.  You have vision changes. Document Released: 05/08/2001 Document Revised: 05/19/2013 Document Reviewed: 07/15/2012 [facility] Patient Information 2015 [place], [place]. This information is not intended to replace advice given to you by your health care provider. Make sure you discuss any questions you have with your health care provider.   Kinesiology taping for pregnancy:  Youtube has good vidoes of "how tos" for lower back, pelvic, hip pain; swelling of feet, etc

## 2017-08-13 NOTE — Progress Notes (Signed)
  [id number] 1653w4d Estimated Date of Delivery: 10/04/17  Blood pressure 108/66, pulse 87, weight 193 lb (87.5 kg), last menstrual period 11/14/2016.   BP weight and urine results all reviewed and noted.  Please refer to the obstetrical flow sheet for the fundal height and fetal heart rate documentation:  Patient reports good fetal movement, denies any bleeding and no rupture of membranes symptoms or regular contractions. Patient is without complaints.oother than pressure when working All questions were answered.   Physical Assessment:   Vitals:   08/13/17 1524  BP: 108/66  Pulse: 87  Weight: 193 lb (87.5 kg)  Body mass index is 34.19 kg/m.        Physical Examination:   General appearance: Well appearing, and in no distress  Mental status: Alert, oriented to person, place, and time  Skin: Warm & dry  Cardiovascular: Normal heart rate noted  Respiratory: Normal respiratory effort, no distress  Abdomen: Soft, gravid, nontender  Pelvic: Cervical exam deferred         Extremities: Edema: Trace  Fetal Status:     Movement: Present    Results for orders placed or performed in visit on 08/13/17 (from the past 24 hour(s))  POCT urinalysis dipstick   Collection Time: 08/13/17  3:26 PM  Result Value Ref Range   Color, UA     Clarity, UA     Glucose, UA neg    Bilirubin, UA     Ketones, UA neg    Spec Grav, UA  1.010 - 1.025   Blood, UA neg    pH, UA  5.0 - 8.0   Protein, UA neg    Urobilinogen, UA  0.2 or 1.0 E.U./dL   Nitrite, UA neg    Leukocytes, UA Negative Negative   Appearance     Odor       Orders Placed This Encounter  Procedures  . POCT urinalysis dipstick    Plan:  Continued routine obstetrical care, kinesiology taping  Return in about 2 weeks (around 08/27/2017) for LROB, order nexplanon.

## 2017-08-15 ENCOUNTER — Encounter: Payer: Medicaid Other | Admitting: Women's Health

## 2017-08-28 ENCOUNTER — Encounter: Payer: Self-pay | Admitting: Advanced Practice Midwife

## 2017-08-28 ENCOUNTER — Ambulatory Visit (INDEPENDENT_AMBULATORY_CARE_PROVIDER_SITE_OTHER): Payer: Medicaid Other | Admitting: Advanced Practice Midwife

## 2017-08-28 VITALS — BP 110/60 | HR 99 | Wt 196.0 lb

## 2017-08-28 DIAGNOSIS — Z3483 Encounter for supervision of other normal pregnancy, third trimester: Secondary | ICD-10-CM

## 2017-08-28 DIAGNOSIS — Z331 Pregnant state, incidental: Secondary | ICD-10-CM

## 2017-08-28 DIAGNOSIS — Z1389 Encounter for screening for other disorder: Secondary | ICD-10-CM

## 2017-08-28 DIAGNOSIS — Z3A34 34 weeks gestation of pregnancy: Secondary | ICD-10-CM

## 2017-08-28 LAB — POCT URINALYSIS DIPSTICK
GLUCOSE UA: NEGATIVE
KETONES UA: NEGATIVE
Leukocytes, UA: NEGATIVE
Nitrite, UA: NEGATIVE
Protein, UA: NEGATIVE
RBC UA: NEGATIVE

## 2017-08-28 NOTE — Patient Instructions (Signed)

## 2017-08-28 NOTE — Progress Notes (Signed)
  [id number] 5485w5d Estimated Date of Delivery: 10/04/17  Blood pressure 110/60, pulse 99, weight 196 lb (88.9 kg), last menstrual period 11/14/2016.   BP weight and urine results all reviewed and noted.  Please refer to the obstetrical flow sheet for the fundal height and fetal heart rate documentation:  Patient reports good fetal movement, denies any bleeding and no rupture of membranes symptoms or regular contractions. Patient is without complaints. All questions were answered.   Physical Assessment:   Vitals:   08/28/17 1541  BP: 110/60  Pulse: 99  Weight: 196 lb (88.9 kg)  Body mass index is 34.72 kg/m.        Physical Examination:   General appearance: Well appearing, and in no distress  Mental status: Alert, oriented to person, place, and time  Skin: Warm & dry  Cardiovascular: Normal heart rate noted  Respiratory: Normal respiratory effort, no distress  Abdomen: Soft, gravid, nontender  Pelvic: Cervical exam deferred         Extremities: Edema: Trace  Fetal Status:     Movement: Present    Results for orders placed or performed in visit on 08/28/17 (from the past 24 hour(s))  POCT urinalysis dipstick   Collection Time: 08/28/17  3:51 PM  Result Value Ref Range   Color, UA     Clarity, UA     Glucose, UA neg    Bilirubin, UA     Ketones, UA neg    Spec Grav, UA  1.010 - 1.025   Blood, UA neg    pH, UA  5.0 - 8.0   Protein, UA neg    Urobilinogen, UA  0.2 or 1.0 E.U./dL   Nitrite, UA neg    Leukocytes, UA Negative Negative   Appearance     Odor       Orders Placed This Encounter  Procedures  . POCT urinalysis dipstick    Plan:  Continued routine obstetrical care,   Return in about 2 weeks (around 09/11/2017) for LROB.

## 2017-09-06 ENCOUNTER — Telehealth: Payer: Self-pay | Admitting: Obstetrics & Gynecology

## 2017-09-06 NOTE — Telephone Encounter (Signed)
Patient states she has noticed increased swelling in her feet and ankles today. She is not having any vision changes but has had headaches throughout the pregnancy.  At her last visit, BP was normal and has not checked it since.  Patient states she is at work in [place].  Informed patient, that we could do a tech visit to check her BP but if she was able to check it in [place] at a [facility] [employer], [facility] that would save her a trip and she could let me know the reading.  Informed provider would not typically order labs just based on LE swelling. Patient states she lives in [place] and if she leaves work she is not going back.  Advised to check BP and if elevated[facility]  Verbalized understanding.

## 2017-09-10 ENCOUNTER — Encounter: Payer: Self-pay | Admitting: Obstetrics & Gynecology

## 2017-09-10 ENCOUNTER — Ambulatory Visit (INDEPENDENT_AMBULATORY_CARE_PROVIDER_SITE_OTHER): Payer: Medicaid Other | Admitting: Obstetrics & Gynecology

## 2017-09-10 ENCOUNTER — Other Ambulatory Visit: Payer: Self-pay

## 2017-09-10 VITALS — BP 130/78 | HR 89 | Wt 198.0 lb

## 2017-09-10 DIAGNOSIS — Z1389 Encounter for screening for other disorder: Secondary | ICD-10-CM

## 2017-09-10 DIAGNOSIS — Z3483 Encounter for supervision of other normal pregnancy, third trimester: Secondary | ICD-10-CM

## 2017-09-10 DIAGNOSIS — Z3A36 36 weeks gestation of pregnancy: Secondary | ICD-10-CM

## 2017-09-10 DIAGNOSIS — Z331 Pregnant state, incidental: Secondary | ICD-10-CM

## 2017-09-10 LAB — POCT URINALYSIS DIPSTICK
Blood, UA: NEGATIVE
GLUCOSE UA: NEGATIVE
Ketones, UA: NEGATIVE
LEUKOCYTES UA: NEGATIVE
Nitrite, UA: NEGATIVE
Protein, UA: NEGATIVE

## 2017-09-10 NOTE — Progress Notes (Signed)
[id number] 8323w4d Estimated Date of Delivery: 10/04/17  Blood pressure 130/78, pulse 89, weight 198 lb (89.8 kg), last menstrual period 11/14/2016.   BP weight and urine results all reviewed and noted.  Please refer to the obstetrical flow sheet for the fundal height and fetal heart rate documentation:  Patient reports good fetal movement, denies any bleeding and no rupture of membranes symptoms or regular contractions. Patient is without complaints. All questions were answered.  Orders Placed This Encounter  Procedures  . GC/Chlamydia Probe Amp  . Culture, beta strep (group b only)  . POCT urinalysis dipstick    Plan:  Continued routine obstetrical care, GBS culture done  Return in about 1 week (around 09/17/2017) for LROB.

## 2017-09-12 LAB — GC/CHLAMYDIA PROBE AMP
Chlamydia trachomatis, NAA: NEGATIVE
Neisseria gonorrhoeae by PCR: NEGATIVE

## 2017-09-14 LAB — CULTURE, BETA STREP (GROUP B ONLY): Strep Gp B Culture: NEGATIVE

## 2017-09-19 ENCOUNTER — Ambulatory Visit (INDEPENDENT_AMBULATORY_CARE_PROVIDER_SITE_OTHER): Payer: Medicaid Other | Admitting: Women's Health

## 2017-09-19 ENCOUNTER — Encounter: Payer: Self-pay | Admitting: Women's Health

## 2017-09-19 VITALS — BP 100/60 | HR 98 | Wt 201.0 lb

## 2017-09-19 DIAGNOSIS — Z1389 Encounter for screening for other disorder: Secondary | ICD-10-CM

## 2017-09-19 DIAGNOSIS — Z331 Pregnant state, incidental: Secondary | ICD-10-CM

## 2017-09-19 DIAGNOSIS — Z3483 Encounter for supervision of other normal pregnancy, third trimester: Secondary | ICD-10-CM

## 2017-09-19 DIAGNOSIS — Z3A37 37 weeks gestation of pregnancy: Secondary | ICD-10-CM

## 2017-09-19 LAB — POCT URINALYSIS DIPSTICK
Blood, UA: NEGATIVE
Glucose, UA: NEGATIVE
KETONES UA: NEGATIVE
LEUKOCYTES UA: NEGATIVE
NITRITE UA: NEGATIVE
PROTEIN UA: NEGATIVE

## 2017-09-19 NOTE — Patient Instructions (Signed)
Erica [name], I greatly value your feedback.  If you receive a survey following your visit with us today, we appreciate you taking the time to fill it out.  Thanks, Erica [name], CNM, WHNP-BC   Call the office [phone]) or go to [facility] if:  You begin to have strong, frequent contractions  Your water breaks.  Sometimes it is a big gush of fluid, sometimes it is just a trickle that keeps getting your panties wet or running down your legs  You have vaginal bleeding.  It is normal to have a small amount of spotting if your cervix was checked.   You don't feel your baby moving like normal.  If you don't, get you something to eat and drink and lay down and focus on feeling your baby move.  You should feel at least 10 movements in 2 hours.  If you don't, you should call the office or go to [facility].     [facility] Contractions of the uterus can occur throughout pregnancy, but they are not always a sign that you are in labor. You may have practice contractions called Braxton Hicks contractions. These false labor contractions are sometimes confused with true labor. What are Deberah PeltonBraxton Hicks contractions? Braxton Hicks contractions are tightening movements that occur in the muscles of the uterus before labor. Unlike true labor contractions, these contractions do not result in opening (dilation) and thinning of the cervix. Toward the end of pregnancy (32-34 weeks), Braxton Hicks contractions can happen more often and may become stronger. These contractions are sometimes difficult to tell apart from true labor because they can be very uncomfortable. You should not feel embarrassed if you go to the hospital with false labor. Sometimes, the only way to tell if you are in true labor is for your health care provider to look for changes in the cervix. The health care provider will do a physical exam and may monitor your contractions. If you are not in true labor, the exam  should show that your cervix is not dilating and your water has not broken. If there are other health problems associated with your pregnancy, it is completely safe for you to be sent home with false labor. You may continue to have Braxton Hicks contractions until you go into true labor. How to tell the difference between true labor and false labor True labor  Contractions last 30-70 seconds.  Contractions become very regular.  Discomfort is usually felt in the top of the uterus, and it spreads to the lower abdomen and low back.  Contractions do not go away with walking.  Contractions usually become more intense and increase in frequency.  The cervix dilates and gets thinner. False labor  Contractions are usually shorter and not as strong as true labor contractions.  Contractions are usually irregular.  Contractions are often felt in the front of the lower abdomen and in the groin.  Contractions may go away when you walk around or change positions while lying down.  Contractions get weaker and are shorter-lasting as time goes on.  The cervix usually does not dilate or become thin. Follow these instructions at home:  Take over-the-counter and prescription medicines only as told by your health care provider.  Keep up with your usual exercises and follow other instructions from your health care provider.  Eat and drink lightly if you think you are going into labor.  If Braxton Hicks contractions are making you uncomfortable: ? Change your position from lying  down or resting to walking, or change from walking to resting. ? Sit and rest in a tub of warm water. ? Drink enough fluid to keep your urine pale yellow. Dehydration may cause these contractions. ? Do slow and deep breathing several times an hour.  Keep all follow-up prenatal visits as told by your health care provider. This is important. Contact a health care provider if:  You have a fever.  You have continuous pain  in your abdomen. Get help right away if:  Your contractions become stronger, more regular, and closer together.  You have fluid leaking or gushing from your vagina.  You pass blood-tinged mucus (bloody show).  You have bleeding from your vagina.  You have low back pain that you never had before.  You feel your baby's head pushing down and causing pelvic pressure.  Your baby is not moving inside you as much as it used to. Summary  Contractions that occur before labor are called Braxton Hicks contractions, false labor, or practice contractions.  Braxton Hicks contractions are usually shorter, weaker, farther apart, and less regular than true labor contractions. True labor contractions usually become progressively stronger and regular and they become more frequent.  Manage discomfort from [facility] contractions by changing position, resting in a warm bath, drinking plenty of water, or practicing deep breathing. This information is not intended to replace advice given to you by your health care provider. Make sure you discuss any questions you have with your health care provider. Document Released: 09/27/2016 Document Revised: 09/27/2016 Document Reviewed: 09/27/2016 Elsevier Interactive Patient Education  2018 [employer].

## 2017-09-19 NOTE — Progress Notes (Signed)
LOW-RISK PREGNANCY VISIT Patient name: Erica [name] MRN [id number]  Date of birth: [birth year] Chief Complaint:   Routine Prenatal Visit  History of Present Illness:   Erica [name] is a 22 y.o. [id number] female at [redacted]w[redacted]d with an Estimated Date of Delivery: 10/04/17 being seen today for ongoing management of a low-risk pregnancy.  Today she reports no complaints. Contractions: Not present.  .  Movement: Present. denies leaking of fluid. Review of Systems:   Pertinent items are noted in HPI Denies abnormal vaginal discharge w/ itching/odor/irritation, headaches, visual changes, shortness of breath, chest pain, abdominal pain, severe nausea/vomiting, or problems with urination or bowel movements unless otherwise stated above. Pertinent History Reviewed:  Reviewed past medical,surgical, social, obstetrical and family history.  Reviewed problem list, medications and allergies. Physical Assessment:   Vitals:   09/19/17 1546  BP: 100/60  Pulse: 98  Weight: 201 lb (91.2 kg)  Body mass index is 35.61 kg/m.        Physical Examination:   General appearance: Well appearing, and in no distress  Mental status: Alert, oriented to person, place, and time  Skin: Warm & dry  Cardiovascular: Normal heart rate noted  Respiratory: Normal respiratory effort, no distress  Abdomen: Soft, gravid, nontender  Pelvic: Cervical exam performed  Dilation: 1 Effacement (%): 50 Station: -2  Extremities: Edema: Trace  Fetal Status: Fetal Heart Rate (bpm): 120 Fundal Height: 37 cm Movement: Present Presentation: Vertex  Results for orders placed or performed in visit on 09/19/17 (from the past 24 hour(s))  POCT urinalysis dipstick   Collection Time: 09/19/17  3:53 PM  Result Value Ref Range   Color, UA     Clarity, UA     Glucose, UA neg    Bilirubin, UA     Ketones, UA neg    Spec Grav, UA  1.010 - 1.025   Blood, UA neg    pH, UA  5.0 - 8.0   Protein, UA neg    Urobilinogen, UA  0.2 or  1.0 E.U./dL   Nitrite, UA neg    Leukocytes, UA Negative Negative   Appearance     Odor      Assessment & Plan:  1) Low-risk pregnancy [id number] at [redacted]w[redacted]d with an Estimated Date of Delivery: 10/04/17    Meds: No orders of the defined types were placed in this encounter.  Labs/procedures today: sve  Plan:  Continue routine obstetrical care   Reviewed: Term labor symptoms and general obstetric precautions including but not limited to vaginal bleeding, contractions, leaking of fluid and fetal movement were reviewed in detail with the patient.  All questions were answered  Follow-up: Return in about 1 week (around 09/26/2017) for LROB.  Orders Placed This Encounter  Procedures  . POCT urinalysis dipstick   Cheral Marker CNM, [facility] 09/19/2017 4:23 PM

## 2017-09-26 ENCOUNTER — Encounter: Payer: Self-pay | Admitting: Women's Health

## 2017-09-26 ENCOUNTER — Ambulatory Visit (INDEPENDENT_AMBULATORY_CARE_PROVIDER_SITE_OTHER): Payer: Medicaid Other | Admitting: Women's Health

## 2017-09-26 VITALS — BP 102/60 | HR 92 | Wt 204.0 lb

## 2017-09-26 DIAGNOSIS — Z3A38 38 weeks gestation of pregnancy: Secondary | ICD-10-CM

## 2017-09-26 DIAGNOSIS — Z3483 Encounter for supervision of other normal pregnancy, third trimester: Secondary | ICD-10-CM

## 2017-09-26 DIAGNOSIS — Z1389 Encounter for screening for other disorder: Secondary | ICD-10-CM

## 2017-09-26 DIAGNOSIS — Z331 Pregnant state, incidental: Secondary | ICD-10-CM

## 2017-09-26 LAB — POCT URINALYSIS DIPSTICK
Blood, UA: NEGATIVE
Glucose, UA: NEGATIVE
KETONES UA: NEGATIVE
Leukocytes, UA: NEGATIVE
NITRITE UA: NEGATIVE
PROTEIN UA: NEGATIVE

## 2017-09-26 NOTE — Progress Notes (Signed)
   LOW-RISK PREGNANCY VISIT Patient name: Erica [name] MRN [id number]  Date of birth: 08/13/1995 Chief Complaint:   Routine Prenatal Visit  History of Present Illness:   Erica [name] is a 22 y.o. [id number] female at [redacted]w[redacted]d with an Estimated Date of Delivery: 10/04/17 being seen today for ongoing management of a low-risk pregnancy.  Today she reports no complaints. Think may have lost part of mucous plug. Contractions: Not present. Vag. Bleeding: None.  Movement: Present. denies leaking of fluid. Review of Systems:   Pertinent items are noted in HPI Denies abnormal vaginal discharge w/ itching/odor/irritation, headaches, visual changes, shortness of breath, chest pain, abdominal pain, severe nausea/vomiting, or problems with urination or bowel movements unless otherwise stated above. Pertinent History Reviewed:  Reviewed past medical,surgical, social, obstetrical and family history.  Reviewed problem list, medications and allergies. Physical Assessment:   Vitals:   09/26/17 1550  BP: 102/60  Pulse: 92  Weight: 204 lb (92.5 kg)  Body mass index is 36.14 kg/m.        Physical Examination:   General appearance: Well appearing, and in no distress  Mental status: Alert, oriented to person, place, and time  Skin: Warm & dry  Cardiovascular: Normal heart rate noted  Respiratory: Normal respiratory effort, no distress  Abdomen: Soft, gravid, nontender  Pelvic: Cervical exam performed  Dilation: 1.5 Effacement (%): 60 Station: -2  Extremities: Edema: Trace  Fetal Status: Fetal Heart Rate (bpm): 135 Fundal Height: 38 cm Movement: Present Presentation: Vertex  Results for orders placed or performed in visit on 09/26/17 (from the past 24 hour(s))  POCT Urinalysis Dipstick   Collection Time: 09/26/17  3:51 PM  Result Value Ref Range   Color, UA     Clarity, UA     Glucose, UA neg    Bilirubin, UA     Ketones, UA neg    Spec Grav, UA  1.010 - 1.025   Blood, UA neg    pH,  UA  5.0 - 8.0   Protein, UA neg    Urobilinogen, UA  0.2 or 1.0 E.U./dL   Nitrite, UA neg    Leukocytes, UA Negative Negative   Appearance     Odor      Assessment & Plan:  1) Low-risk pregnancy [id number] at [redacted]w[redacted]d with an Estimated Date of Delivery: 10/04/17    Meds: No orders of the defined types were placed in this encounter.  Labs/procedures today: sve  Plan:  Continue routine obstetrical care   Reviewed: Term labor symptoms and general obstetric precautions including but not limited to vaginal bleeding, contractions, leaking of fluid and fetal movement were reviewed in detail with the patient.  All questions were answered  Follow-up: Return in about 1 week (around 10/03/2017) for LROB.  Orders Placed This Encounter  Procedures  . POCT Urinalysis Dipstick   Cheral Marker CNM, [facility] 09/26/2017 4:01 PM

## 2017-09-26 NOTE — Patient Instructions (Signed)
Erica [name], I greatly value your feedback.  If you receive a survey following your visit with [place] today, we appreciate you taking the time to fill it out.  Thanks, Joellyn Haff, CNM, WHNP-BC   Call the office [phone]) or go to [facility] if:  You begin to have strong, frequent contractions  Your water breaks.  Sometimes it is a big gush of fluid, sometimes it is just a trickle that keeps getting your panties wet or running down your legs  You have vaginal bleeding.  It is normal to have a small amount of spotting if your cervix was checked.   You don't feel your baby moving like normal.  If you don't, get you something to eat and drink and lay down and focus on feeling your baby move.  You should feel at least 10 movements in 2 hours.  If you don't, you should call the office or go to [facility].     [facility] Contractions Contractions of the uterus can occur throughout pregnancy, but they are not always a sign that you are in labor. You may have practice contractions called Braxton Hicks contractions. These false labor contractions are sometimes confused with true labor. What are Deberah Pelton contractions? Braxton Hicks contractions are tightening movements that occur in the muscles of the uterus before labor. Unlike true labor contractions, these contractions do not result in opening (dilation) and thinning of the cervix. Toward the end of pregnancy (32-34 weeks), Braxton Hicks contractions can happen more often and may become stronger. These contractions are sometimes difficult to tell apart from true labor because they can be very uncomfortable. You should not feel embarrassed if you go to the hospital with false labor. Sometimes, the only way to tell if you are in true labor is for your health care provider to look for changes in the cervix. The health care provider will do a physical exam and may monitor your contractions. If you are not in true labor, the exam  should show that your cervix is not dilating and your water has not broken. If there are other health problems associated with your pregnancy, it is completely safe for you to be sent home with false labor. You may continue to have Braxton Hicks contractions until you go into true labor. How to tell the difference between true labor and false labor True labor  Contractions last 30-70 seconds.  Contractions become very regular.  Discomfort is usually felt in the top of the uterus, and it spreads to the lower abdomen and low back.  Contractions do not go away with walking.  Contractions usually become more intense and increase in frequency.  The cervix dilates and gets thinner. False labor  Contractions are usually shorter and not as strong as true labor contractions.  Contractions are usually irregular.  Contractions are often felt in the front of the lower abdomen and in the groin.  Contractions may go away when you walk around or change positions while lying down.  Contractions get weaker and are shorter-lasting as time goes on.  The cervix usually does not dilate or become thin. Follow these instructions at home:  Take over-the-counter and prescription medicines only as told by your health care provider.  Keep up with your usual exercises and follow other instructions from your health care provider.  Eat and drink lightly if you think you are going into labor.  If Braxton Hicks contractions are making you uncomfortable: ? Change your position from lying  down or resting to walking, or change from walking to resting. ? Sit and rest in a tub of warm water. ? Drink enough fluid to keep your urine pale yellow. Dehydration may cause these contractions. ? Do slow and deep breathing several times an hour.  Keep all follow-up prenatal visits as told by your health care provider. This is important. Contact a health care provider if:  You have a fever.  You have continuous pain  in your abdomen. Get help right away if:  Your contractions become stronger, more regular, and closer together.  You have fluid leaking or gushing from your vagina.  You pass blood-tinged mucus (bloody show).  You have bleeding from your vagina.  You have low back pain that you never had before.  You feel your baby's head pushing down and causing pelvic pressure.  Your baby is not moving inside you as much as it used to. Summary  Contractions that occur before labor are called Braxton Hicks contractions, false labor, or practice contractions.  Braxton Hicks contractions are usually shorter, weaker, farther apart, and less regular than true labor contractions. True labor contractions usually become progressively stronger and regular and they become more frequent.  Manage discomfort from [facility] contractions by changing position, resting in a warm bath, drinking plenty of water, or practicing deep breathing. This information is not intended to replace advice given to you by your health care provider. Make sure you discuss any questions you have with your health care provider. Document Released: 09/27/2016 Document Revised: 09/27/2016 Document Reviewed: 09/27/2016 Elsevier Interactive Patient Education  2018 [employer].

## 2017-10-01 ENCOUNTER — Inpatient Hospital Stay (HOSPITAL_COMMUNITY)
Admission: AD | Admit: 2017-10-01 | Discharge: 2017-10-04 | DRG: 807 | Disposition: A | Discharge status: Home / Self Care | Payer: Medicaid Other | Source: Ambulatory Visit | Attending: Family Medicine | Admitting: Family Medicine

## 2017-10-01 ENCOUNTER — Inpatient Hospital Stay (HOSPITAL_COMMUNITY): Payer: Medicaid Other | Admitting: Anesthesiology

## 2017-10-01 ENCOUNTER — Other Ambulatory Visit: Payer: Self-pay

## 2017-10-01 ENCOUNTER — Encounter (HOSPITAL_COMMUNITY): Payer: Self-pay | Admitting: *Deleted

## 2017-10-01 DIAGNOSIS — D573 Sickle-cell trait: Secondary | ICD-10-CM | POA: Diagnosis present

## 2017-10-01 DIAGNOSIS — O4290 Premature rupture of membranes, unspecified as to length of time between rupture and onset of labor, unspecified weeks of gestation: Secondary | ICD-10-CM

## 2017-10-01 DIAGNOSIS — O4292 Full-term premature rupture of membranes, unspecified as to length of time between rupture and onset of labor: Principal | ICD-10-CM | POA: Diagnosis present

## 2017-10-01 DIAGNOSIS — O9902 Anemia complicating childbirth: Secondary | ICD-10-CM | POA: Diagnosis present

## 2017-10-01 DIAGNOSIS — Z3A39 39 weeks gestation of pregnancy: Secondary | ICD-10-CM | POA: Diagnosis not present

## 2017-10-01 LAB — CBC
HCT: 34.3 % — ABNORMAL LOW (ref 36.0–46.0)
Hemoglobin: 11.6 g/dL — ABNORMAL LOW (ref 12.0–15.0)
MCH: 25.8 pg — ABNORMAL LOW (ref 26.0–34.0)
MCHC: 33.8 g/dL (ref 30.0–36.0)
MCV: 76.2 fL — ABNORMAL LOW (ref 78.0–100.0)
PLATELETS: 224 10*3/uL (ref 150–400)
RBC: 4.5 MIL/uL (ref 3.87–5.11)
RDW: 14.3 % (ref 11.5–15.5)
WBC: 9 10*3/uL (ref 4.0–10.5)

## 2017-10-01 LAB — TYPE AND SCREEN
ABO/RH(D): O POS
Antibody Screen: NEGATIVE

## 2017-10-01 LAB — POCT FERN TEST: POCT Fern Test: POSITIVE

## 2017-10-01 MED ORDER — FENTANYL CITRATE (PF) 100 MCG/2ML IJ SOLN: 50.0000 ug | INTRAMUSCULAR | Status: DC | PRN

## 2017-10-01 MED ORDER — HYDROXYZINE HCL 50 MG PO TABS
50.0000 mg | ORAL_TABLET | Freq: Four times a day (QID) | ORAL | Status: DC | PRN
  Filled 2017-10-01: qty 1

## 2017-10-01 MED ORDER — OXYCODONE-ACETAMINOPHEN 5-325 MG PO TABS: 1.0000 | ORAL_TABLET | ORAL | Status: DC | PRN

## 2017-10-01 MED ORDER — OXYCODONE-ACETAMINOPHEN 5-325 MG PO TABS: 2.0000 | ORAL_TABLET | ORAL | Status: DC | PRN

## 2017-10-01 MED ORDER — EPHEDRINE 5 MG/ML INJ
10.0000 mg | INTRAVENOUS | Status: DC | PRN
  Filled 2017-10-01: qty 2

## 2017-10-01 MED ORDER — FLEET ENEMA 7-19 GM/118ML RE ENEM: 1.0000 | ENEMA | RECTAL | Status: DC | PRN

## 2017-10-01 MED ORDER — OXYTOCIN BOLUS FROM INFUSION
500.0000 mL | Freq: Once | INTRAVENOUS | Status: AC
  Administered 2017-10-02: 500 mL via INTRAVENOUS

## 2017-10-01 MED ORDER — LACTATED RINGERS IV SOLN
500.0000 mL | Freq: Once | INTRAVENOUS | Status: AC
  Administered 2017-10-01: 500 mL via INTRAVENOUS

## 2017-10-01 MED ORDER — LACTATED RINGERS IV SOLN
INTRAVENOUS | Status: DC
  Administered 2017-10-01 – 2017-10-02 (×2): via INTRAVENOUS

## 2017-10-01 MED ORDER — SOD CITRATE-CITRIC ACID 500-334 MG/5ML PO SOLN: 30.0000 mL | ORAL | Status: DC | PRN

## 2017-10-01 MED ORDER — LIDOCAINE HCL (PF) 1 % IJ SOLN
30.0000 mL | INTRAMUSCULAR | Status: DC | PRN
  Filled 2017-10-01: qty 30

## 2017-10-01 MED ORDER — PHENYLEPHRINE 40 MCG/ML (10ML) SYRINGE FOR IV PUSH (FOR BLOOD PRESSURE SUPPORT)
80.0000 ug | PREFILLED_SYRINGE | INTRAVENOUS | Status: DC | PRN
  Filled 2017-10-01: qty 10
  Filled 2017-10-01: qty 5

## 2017-10-01 MED ORDER — LACTATED RINGERS IV SOLN
500.0000 mL | INTRAVENOUS | Status: DC | PRN
Start: 2017-10-01 — End: 2017-10-02
  Administered 2017-10-02: 200 mL via INTRAVENOUS
  Administered 2017-10-02: 500 mL via INTRAVENOUS
  Administered 2017-10-02: 300 mL via INTRAVENOUS

## 2017-10-01 MED ORDER — OXYTOCIN 40 UNITS IN LACTATED RINGERS INFUSION - SIMPLE MED
2.5000 [IU]/h | INTRAVENOUS | Status: DC
  Filled 2017-10-01: qty 1000

## 2017-10-01 MED ORDER — FENTANYL 2.5 MCG/ML BUPIVACAINE 1/10 % EPIDURAL INFUSION (WH - ANES)
14.0000 mL/h | INTRAMUSCULAR | Status: DC | PRN
  Administered 2017-10-01: 14 mL/h via EPIDURAL
  Filled 2017-10-01: qty 100

## 2017-10-01 MED ORDER — ONDANSETRON HCL 4 MG/2ML IJ SOLN: 4.0000 mg | Freq: Four times a day (QID) | INTRAMUSCULAR | Status: DC | PRN

## 2017-10-01 MED ORDER — LIDOCAINE HCL (PF) 1 % IJ SOLN
INTRAMUSCULAR | Status: DC | PRN
  Administered 2017-10-01 (×2): 5 mL via EPIDURAL

## 2017-10-01 MED ORDER — ACETAMINOPHEN 325 MG PO TABS: 650.0000 mg | ORAL_TABLET | ORAL | Status: DC | PRN

## 2017-10-01 MED ORDER — PHENYLEPHRINE 40 MCG/ML (10ML) SYRINGE FOR IV PUSH (FOR BLOOD PRESSURE SUPPORT)
80.0000 ug | PREFILLED_SYRINGE | INTRAVENOUS | Status: DC | PRN
  Filled 2017-10-01: qty 5

## 2017-10-01 MED ORDER — DIPHENHYDRAMINE HCL 50 MG/ML IJ SOLN: 12.5000 mg | INTRAMUSCULAR | Status: DC | PRN

## 2017-10-01 NOTE — MAU Note (Signed)
PT SAYS  SHE WAS EXERCISING ON UTUBE-  DOING SQUATS- FELT THE BABY PUNCH  AND WATER CAME OUT -   7PM.  PNC -  FAMILY [facility]-   VE IN OFFICE - ON Thursday -   1-2  CM.   DENIES  HSV AND MRSA.  GBS- NEG

## 2017-10-01 NOTE — Anesthesia Procedure Notes (Signed)
Epidural Patient location during procedure: OB Start time: 10/01/2017 10:50 PM End time: 10/01/2017 11:00 PM  Staffing Anesthesiologist: Leonides Grills, MD Performed: anesthesiologist   Preanesthetic Checklist Completed: patient identified, site marked, pre-op evaluation, timeout performed, IV checked, risks and benefits discussed and monitors and equipment checked  Epidural Patient position: sitting Prep: DuraPrep Patient monitoring: heart rate, cardiac monitor, continuous pulse ox and blood pressure Approach: midline Location: L4-L5 Injection technique: LOR air  Needle:  Needle type: Tuohy  Needle gauge: 17 G Needle length: 9 cm Needle insertion depth: 7 cm Catheter type: closed end flexible Catheter size: 19 Gauge Catheter at skin depth: 12 cm Test dose: negative and Other  Assessment Events: blood not aspirated, injection not painful, no injection resistance and negative IV test  Additional Notes Informed consent obtained prior to proceeding including risk of failure, 1% risk of PDPH, risk of minor discomfort and bruising. Discussed alternatives to epidural analgesia and patient desires to proceed.  Timeout performed pre-procedure verifying patient name, procedure, and platelet count.  Patient tolerated procedure well. Reason for block:procedure for pain

## 2017-10-01 NOTE — Anesthesia Preprocedure Evaluation (Signed)
Anesthesia Evaluation  Patient identified by MRN, date of birth, ID band Patient awake    Reviewed: Allergy & Precautions, H&P , NPO status , Patient's Chart, lab work & pertinent test results  History of Anesthesia Complications Negative for: history of anesthetic complications  Airway Mallampati: II  TM Distance: >3 FB Neck ROM: full    Dental no notable dental hx. (+) Teeth Intact   Pulmonary neg pulmonary ROS,    Pulmonary exam normal breath sounds clear to auscultation       Cardiovascular negative cardio ROS Normal cardiovascular exam Rhythm:regular Rate:Normal     Neuro/Psych negative neurological ROS  negative psych ROS   GI/Hepatic negative GI ROS, Neg liver ROS,   Endo/Other  negative endocrine ROS  Renal/GU negative Renal ROS  negative genitourinary   Musculoskeletal   Abdominal (+) + obese,   Peds  Hematology  (+) Sickle cell trait and anemia ,   Anesthesia Other Findings   Reproductive/Obstetrics (+) Pregnancy                             Anesthesia Physical Anesthesia Plan  ASA: II  Anesthesia Plan: Epidural   Post-op Pain Management:    Induction:   PONV Risk Score and Plan:   Airway Management Planned:   Additional Equipment:   Intra-op Plan:   Post-operative Plan:   Informed Consent: I have reviewed the patients History and Physical, chart, labs and discussed the procedure including the risks, benefits and alternatives for the proposed anesthesia with the patient or authorized representative who has indicated his/her understanding and acceptance.     Plan Discussed with:   Anesthesia Plan Comments:         Anesthesia Quick Evaluation

## 2017-10-01 NOTE — H&P (Signed)
Erica [name] is a 22 y.o. [phone] female at [redacted]w[redacted]d by 6wk u/s presenting w/ PROM at 70, clear fluid while exercising, no uc's.   Reports active fetal movement, contractions: none, vaginal bleeding: none, membranes: ruptured, clear fluid. Initiated prenatal care at [facility] at 8 wks.    This pregnancy complicated by: Erica [name] trait  Prenatal History/Complications:  Term uncomplicated SVB x 2, SAB x 1  Past Medical History: Past Medical History:  Diagnosis Date  . Anemia   . Chlamydia 07/06/14  . Chlamydia infection 08/02/2014  . Chlamydia infection during pregnancy, antepartum 07/27/2014   Had +CHL 2/9 treated in hospital 2/19 will check POT 3/1  . Elevated BP 07/27/2014  . Ganglion cyst of wrist    left  . Pelvic pain in female 07/27/2014  . Pregnant 12/29/2014  . Screening for STD (sexually transmitted disease) 07/27/2014  . Sinus infection 12/29/2014  . Strep throat   . Vaginal discharge 12/29/2014  . Yeast infection 04/07/2013    Past Surgical History: Past Surgical History:  Procedure Laterality Date  . NO PAST SURGERIES      Obstetrical History: OB History    Gravida  4   Para  2   Term  2   Preterm      AB  1   Living  2     SAB  1   TAB      Ectopic      Multiple  0   Live Births  2           Social History: Social History   Socioeconomic History  . Marital status: Legally Separated    Spouse name: Not on file  . Number of children: Not on file  . Years of education: Not on file  . Highest education level: Not on file  Occupational History  . Not on file  Social Needs  . Financial resource strain: Not on file  . Food insecurity:    Worry: Not on file    Inability: Not on file  . Transportation needs:    Medical: Not on file    Non-medical: Not on file  Tobacco Use  . Smoking status: Never Smoker  . Smokeless tobacco: Never Used  Substance and Sexual Activity  . Alcohol use: No  . Drug use: No  . Sexual activity: Yes    Birth  control/protection: None  Lifestyle  . Physical activity:    Days per week: Not on file    Minutes per session: Not on file  . Stress: Not on file  Relationships  . Social connections:    Talks on phone: Not on file    Gets together: Not on file    Attends religious service: Not on file    Active member of club or organization: Not on file    Attends meetings of clubs or organizations: Not on file    Relationship status: Not on file  Other Topics Concern  . Not on file  Social History Narrative  . Not on file    Family History: Family History  Problem Relation Age of Onset  . Diabetes Maternal Grandmother   . Diabetes Paternal Grandmother   . Multiple sclerosis Cousin     Allergies: No Known Allergies  Medications Prior to Admission  Medication Sig Dispense Refill Last Dose  . Prenatal Vit-Fe Fumarate-FA (PRENATAL VITAMINS) 28-0.8 MG TABS Take 1 daily 30 tablet  Taking    Review of Systems  Pertinent pos/neg as indicated  in HPI  Blood pressure 120/70, pulse 98, temperature 99.2 F (37.3 C), temperature source Oral, resp. rate 20, height  (1.6 m), weight 93.1 kg (205 lb 4 oz), last menstrual period 11/14/2016. General appearance: alert, cooperative and no distress Lungs: clear to auscultation bilaterally Heart: regular rate and rhythm Abdomen: gravid, soft, non-tender Extremities: tr edema DTR's 2+  Fetal monitoring: FHR: 140 bpm, variability: moderate,  Accelerations: Present,  decelerations:  Absent Uterine activity: irregular Dilation: 2 Effacement (%): 70 Station: -3 Exam by:: ginger morris rn Presentation: cephalic   Prenatal labs: ABO, Rh: O/Positive/-- (10/03 1512) Antibody: Negative (02/07 0944) Rubella: 1.97 (10/03 1512) RPR: Non Reactive (02/07 0944)  HBsAg: Negative (10/03 1512)  HIV: Non Reactive (02/07 0944)  GBS:     2hr GTT: 76/148/122 Genetic screening:  neg Anatomy US: normal female  No results found for this or any previous  visit (from the past 24 hour(s)).   Assessment:  [redacted]w[redacted]d SIUP  G4P2012  PROM  Cat 1 FHR  GBS  neg  Plan:  Admit to [facility]  IV pain meds/epidural prn active labor  Expectant management, monitor for need for induction  Anticipate NSVB   Plans to breastfeed  Contraception: Nexplanon  Circumcision: at [employer] CNM, WHNP-BC 10/01/2017, 8:39 PM

## 2017-10-02 ENCOUNTER — Encounter: Payer: Medicaid Other | Admitting: Advanced Practice Midwife

## 2017-10-02 DIAGNOSIS — Z3A39 39 weeks gestation of pregnancy: Secondary | ICD-10-CM

## 2017-10-02 LAB — RPR: RPR: NONREACTIVE

## 2017-10-02 MED ORDER — ONDANSETRON HCL 4 MG PO TABS: 4.0000 mg | ORAL_TABLET | ORAL | Status: DC | PRN

## 2017-10-02 MED ORDER — ZOLPIDEM TARTRATE 5 MG PO TABS: 5.0000 mg | ORAL_TABLET | Freq: Every evening | ORAL | Status: DC | PRN

## 2017-10-02 MED ORDER — MEASLES, MUMPS & RUBELLA VAC ~~LOC~~ INJ
0.5000 mL | INJECTION | Freq: Once | SUBCUTANEOUS | Status: DC
  Filled 2017-10-02: qty 0.5

## 2017-10-02 MED ORDER — ACETAMINOPHEN 325 MG PO TABS
650.0000 mg | ORAL_TABLET | ORAL | Status: DC | PRN
  Administered 2017-10-02 – 2017-10-03 (×2): 650 mg via ORAL
  Filled 2017-10-02 (×2): qty 2

## 2017-10-02 MED ORDER — SODIUM CHLORIDE 0.9% FLUSH: 3.0000 mL | Freq: Two times a day (BID) | INTRAVENOUS | Status: DC

## 2017-10-02 MED ORDER — BISACODYL 10 MG RE SUPP: 10.0000 mg | Freq: Every day | RECTAL | Status: DC | PRN

## 2017-10-02 MED ORDER — TETANUS-DIPHTH-ACELL PERTUSSIS 5-2.5-18.5 LF-MCG/0.5 IM SUSP: 0.5000 mL | Freq: Once | INTRAMUSCULAR | Status: DC

## 2017-10-02 MED ORDER — WITCH HAZEL-GLYCERIN EX PADS: 1.0000 "application " | MEDICATED_PAD | CUTANEOUS | Status: DC | PRN

## 2017-10-02 MED ORDER — SODIUM CHLORIDE 0.9% FLUSH: 3.0000 mL | INTRAVENOUS | Status: DC | PRN

## 2017-10-02 MED ORDER — SIMETHICONE 80 MG PO CHEW
80.0000 mg | CHEWABLE_TABLET | ORAL | Status: DC | PRN
Start: 2017-10-02 — End: 2017-10-04

## 2017-10-02 MED ORDER — DIPHENHYDRAMINE HCL 25 MG PO CAPS: 25.0000 mg | ORAL_CAPSULE | Freq: Four times a day (QID) | ORAL | Status: DC | PRN

## 2017-10-02 MED ORDER — BENZOCAINE-MENTHOL 20-0.5 % EX AERO
1.0000 "application " | INHALATION_SPRAY | CUTANEOUS | Status: DC | PRN
  Filled 2017-10-02: qty 56

## 2017-10-02 MED ORDER — COCONUT OIL OIL: 1.0000 "application " | TOPICAL_OIL | Status: DC | PRN

## 2017-10-02 MED ORDER — SENNOSIDES-DOCUSATE SODIUM 8.6-50 MG PO TABS
2.0000 | ORAL_TABLET | ORAL | Status: DC
  Administered 2017-10-03: 2 via ORAL
  Filled 2017-10-02: qty 2

## 2017-10-02 MED ORDER — DIBUCAINE 1 % RE OINT: 1.0000 "application " | TOPICAL_OINTMENT | RECTAL | Status: DC | PRN

## 2017-10-02 MED ORDER — PRENATAL MULTIVITAMIN CH
1.0000 | ORAL_TABLET | Freq: Every day | ORAL | Status: DC
  Administered 2017-10-02 – 2017-10-03 (×2): 1 via ORAL
  Filled 2017-10-02 (×2): qty 1

## 2017-10-02 MED ORDER — IBUPROFEN 600 MG PO TABS
600.0000 mg | ORAL_TABLET | Freq: Four times a day (QID) | ORAL | Status: DC
  Administered 2017-10-02 – 2017-10-04 (×7): 600 mg via ORAL
  Filled 2017-10-02 (×7): qty 1

## 2017-10-02 MED ORDER — FLEET ENEMA 7-19 GM/118ML RE ENEM: 1.0000 | ENEMA | Freq: Every day | RECTAL | Status: DC | PRN

## 2017-10-02 MED ORDER — SODIUM CHLORIDE 0.9 % IV SOLN: 250.0000 mL | INTRAVENOUS | Status: DC | PRN

## 2017-10-02 MED ORDER — ONDANSETRON HCL 4 MG/2ML IJ SOLN: 4.0000 mg | INTRAMUSCULAR | Status: DC | PRN

## 2017-10-02 NOTE — Progress Notes (Signed)
Patient ID: Erica [name], female   DOB: 20-May-1996, 22 y.o.   MRN: [id number] Erica [name] is a 22 y.o. [phone] at [redacted]w[redacted]d admitted for rupture of membranes  Subjective: Contractions became more intense, now comfortable w/ epidural  Objective: BP 103/64   Pulse 71   Temp 98 F (36.7 C) (Oral)   Resp 16   Ht  (1.6 m)   Wt 93.1 kg (205 lb 4 oz)   LMP 11/14/2016 (Exact Date)   SpO2 100%   BMI 36.36 kg/m  No intake/output data recorded.  FHT:  FHR: 135 bpm, variability: moderate,  accelerations:  absent,  decelerations:  Absent UC:   q 2-29mins  SVE:   Dilation: 4.5 Effacement (%): 80, 90 Station: -3 Exam by:: Lahoma Crocker, RN  Labs: Lab Results  Component Value Date   WBC 9.0 10/01/2017   HGB 11.6 (L) 10/01/2017   HCT 34.3 (L) 10/01/2017   MCV 76.2 (L) 10/01/2017   PLT 224 10/01/2017    Assessment / Plan: Spontaneous labor, progressing normally  Labor: Progressing normally Fetal Wellbeing:  Category I Pain Control:  Epidural Pre-eclampsia: n/a I/D:  n/a Anticipated MOD:  NSVD  Cheral Marker CNM, WHNP-BC 10/02/2017, 0200

## 2017-10-02 NOTE — Lactation Note (Signed)
This note was copied from a baby's chart. Lactation Consultation Note  Patient Name: Erica [name] [id number] Date: 10/02/2017 Reason for consult: Initial assessment;Term Breastfeeding consultation services and support information given to patient.  Baby is 5 hours old and fed well in L&D.  Assisted with waking techniques but no feeding cues observed.  Mom taught hand expression and one drop visible.  Attempted to latch baby but he showed no interest.  Reassured mom and instructed to watch for cues and call out for assist prn.  Maternal Data Has patient been taught Hand Expression?: Yes Does the patient have breastfeeding experience prior to this delivery?: Yes  Feeding Feeding Type: Breast Fed  LATCH Score Latch: Too sleepy or reluctant, no latch achieved, no sucking elicited.  Audible Swallowing: None  Type of Nipple: Everted at rest and after stimulation  Comfort (Breast/Nipple): Soft / non-tender  Hold (Positioning): Assistance needed to correctly position infant at breast and maintain latch.  LATCH Score: 5  Interventions    Lactation Tools Discussed/Used     Consult Status Consult Status: Follow-up Date: 10/03/17 Follow-up type: In-patient    Huston Foley 10/02/2017, 10:09 AM

## 2017-10-02 NOTE — Lactation Note (Signed)
This note was copied from a baby's chart. Lactation Consultation Note  Patient Name: Erica [name] Today's Date: 10/02/2017    RN Request for Follow up Visit:  Mother had just finished pumping and spoon feeding infant 2.5 mls of colostrum when I arrived.  Mother states that baby will latch but not suck.  He is awake and alert now but not showing feeding cues.  Offered to assist mother with latch if she would like to try again.  She politely declined this time but stated she may call the next time he is ready to feed.  Encouraged STS, breast massage and hand expression.  Provided a plastic container to collect any EBM she may obtain.  She will spoon feed or finger feed any amount she gets to baby.  She has a DEBP and will use that for breast stimulation when he does not effectively latch and suck.  Mother has no further questions/concerns at this time.                Welles Walthall R Tilla Wilborn 10/02/2017, 10:08 PM

## 2017-10-02 NOTE — Anesthesia Postprocedure Evaluation (Signed)
Anesthesia Post Note  Patient: Erica [name]  Procedure(s) Performed: AN AD HOC LABOR EPIDURAL     Patient location during evaluation: Mother Baby Anesthesia Type: Epidural Level of consciousness: awake Pain management: pain level controlled Vital Signs Assessment: post-procedure vital signs reviewed and stable Respiratory status: spontaneous breathing Cardiovascular status: stable Postop Assessment: epidural receding and patient able to bend at knees Anesthetic complications: no    Last Vitals:  Vitals:   10/02/17 0755 10/02/17 1200  BP: 126/79 113/72  Pulse: 84 69  Resp: 18   Temp: 36.7 C 36.9 C  SpO2: 100% 100%    Last Pain:  Vitals:   10/02/17 1207  TempSrc:   PainSc: 0-No pain   Pain Goal:                 Edison Pace

## 2017-10-03 LAB — BIRTH TISSUE RECOVERY COLLECTION (PLACENTA DONATION)

## 2017-10-03 MED ORDER — IBUPROFEN 600 MG PO TABS: 600.0000 mg | ORAL_TABLET | Freq: Four times a day (QID) | ORAL | 0 refills | Status: DC

## 2017-10-03 NOTE — Discharge Instructions (Signed)
Vaginal Delivery, Care After °Refer to this sheet in the next few weeks. These instructions provide you with information about caring for yourself after vaginal delivery. Your health care provider may also give you more specific instructions. Your treatment has been planned according to current medical practices, but problems sometimes occur. Call your health care provider if you have any problems or questions. °What can I expect after the procedure? °After vaginal delivery, it is common to have: °· Some bleeding from your vagina. °· Soreness in your abdomen, your vagina, and the area of skin between your vaginal opening and your anus (perineum). °· Pelvic cramps. °· Fatigue. ° °Follow these instructions at home: °Medicines °· Take over-the-counter and prescription medicines only as told by your health care provider. °· If you were prescribed an antibiotic medicine, take it as told by your health care provider. Do not stop taking the antibiotic until it is finished. °Driving ° °· Do not drive or operate heavy machinery while taking prescription pain medicine. °· Do not drive for 24 hours if you received a sedative. °Lifestyle °· Do not drink alcohol. This is especially important if you are breastfeeding or taking medicine to relieve pain. °· Do not use tobacco products, including cigarettes, chewing tobacco, or e-cigarettes. If you need help quitting, ask your health care provider. °Eating and drinking °· Drink at least 8 eight-ounce glasses of water every day unless you are told not to by your health care provider. If you choose to breastfeed your baby, you may need to drink more water than this. °· Eat high-fiber foods every day. These foods may help prevent or relieve constipation. High-fiber foods include: °? Whole grain cereals and breads. °? Brown rice. °? Beans. °? Fresh fruits and vegetables. °Activity °· Return to your normal activities as told by your health care provider. Ask your health care provider  what activities are safe for you. °· Rest as much as possible. Try to rest or take a nap when your baby is sleeping. °· Do not lift anything that is heavier than your baby or 10 lb (4.5 kg) until your health care provider says that it is safe. °· Talk with your health care provider about when you can engage in sexual activity. This may depend on your: °? Risk of infection. °? Rate of healing. °? Comfort and desire to engage in sexual activity. °Vaginal Care °· If you have an episiotomy or a vaginal tear, check the area every day for signs of infection. Check for: °? More redness, swelling, or pain. °? More fluid or blood. °? Warmth. °? Pus or a bad smell. °· Do not use tampons or douches until your health care provider says this is safe. °· Watch for any blood clots that may pass from your vagina. These may look like clumps of dark red, brown, or black discharge. °General instructions °· Keep your perineum clean and dry as told by your health care provider. °· Wear loose, comfortable clothing. °· Wipe from front to back when you use the toilet. °· Ask your health care provider if you can shower or take a bath. If you had an episiotomy or a perineal tear during labor and delivery, your health care provider may tell you not to take baths for a certain length of time. °· Wear a bra that supports your breasts and fits you well. °· If possible, have someone help you with household activities and help care for your baby for at least a few days after   you leave the hospital. °· Keep all follow-up visits for you and your baby as told by your health care provider. This is important. °Contact a health care provider if: °· You have: °? Vaginal discharge that has a bad smell. °? Difficulty urinating. °? Pain when urinating. °? A sudden increase or decrease in the frequency of your bowel movements. °? More redness, swelling, or pain around your episiotomy or vaginal tear. °? More fluid or blood coming from your episiotomy or  vaginal tear. °? Pus or a bad smell coming from your episiotomy or vaginal tear. °? A fever. °? A rash. °? Little or no interest in activities you used to enjoy. °? Questions about caring for yourself or your baby. °· Your episiotomy or vaginal tear feels warm to the touch. °· Your episiotomy or vaginal tear is separating or does not appear to be healing. °· Your breasts are painful, hard, or turn red. °· You feel unusually sad or worried. °· You feel nauseous or you vomit. °· You pass large blood clots from your vagina. If you pass a blood clot from your vagina, save it to show to your health care provider. Do not flush blood clots down the toilet without having your health care provider look at them. °· You urinate more than usual. °· You are dizzy or light-headed. °· You have not breastfed at all and you have not had a menstrual period for 12 weeks after delivery. °· You have stopped breastfeeding and you have not had a menstrual period for 12 weeks after you stopped breastfeeding. °Get help right away if: °· You have: °? Pain that does not go away or does not get better with medicine. °? Chest pain. °? Difficulty breathing. °? Blurred vision or spots in your vision. °? Thoughts about hurting yourself or your baby. °· You develop pain in your abdomen or in one of your legs. °· You develop a severe headache. °· You faint. °· You bleed from your vagina so much that you fill two sanitary pads in one hour. °This information is not intended to replace advice given to you by your health care provider. Make sure you discuss any questions you have with your health care provider. °Document Released: 05/11/2000 Document Revised: 10/26/2015 Document Reviewed: 05/29/2015 °Elsevier Interactive Patient Education © 2018 [employer] Inc[employer] ° °

## 2017-10-03 NOTE — Discharge Summary (Addendum)
OB Discharge Summary     Patient Name: Erica [name] DOB: April 29, 1996 MRN: [id number]  Date of admission: 10/01/2017 Delivering MD: Shawna Clamp R   Date of discharge: 10/03/2017  Admitting diagnosis: 39WKS WATER BROKE Intrauterine pregnancy: [redacted]w[redacted]d     Secondary diagnosis:  Active Problems:   NSVD (normal spontaneous vaginal delivery)  Additional problems: none     Discharge diagnosis: Term Pregnancy Delivered                                                                                                Post partum procedures:none  Augmentation: none  Complications: None  Hospital course:  Onset of Labor With Vaginal Delivery     22 y.o. yo [id number] at [redacted]w[redacted]d was admitted in Active Labor on 10/01/2017. Patient had an uncomplicated labor course as follows:  Membrane Rupture Time/Date: 7:00 PM ,10/01/2017   Intrapartum Procedures: Episiotomy: None [1]                                        Lacerations:  None [1]  Patient had a delivery of a Viable infant. 10/02/2017  Information for the patient's newborn:  Admire, Bunnell [[id number]]  Delivery Method: Vaginal, Spontaneous(Filed from Delivery Summary)    Pateint had an uncomplicated postpartum course.  She is ambulating, tolerating a regular diet, passing flatus, and urinating well. Patient is discharged home in stable condition on 10/03/17.   Physical exam  Vitals:   10/02/17 0755 10/02/17 1200 10/02/17 2041 10/03/17 0525  BP: 126/79 113/72 121/74 107/73  Pulse: 84 69 81 63  Resp: Temp: 98 F (36.7 C) 98.5 F (36.9 C) 98.6 F (37 C) 99.4 F (37.4 C)  TempSrc: Oral Oral  Oral  SpO2: 100% 100% 100%   Weight:      Height:       General: alert, cooperative and no distress Lochia: appropriate Uterine Fundus: none Incision: N/A DVT Evaluation: No evidence of DVT seen on physical exam. Negative Homan's sign. Labs: Lab Results  Component Value Date   WBC 9.0 10/01/2017   HGB 11.6 (L)  10/01/2017   HCT 34.3 (L) 10/01/2017   MCV 76.2 (L) 10/01/2017   PLT 224 10/01/2017   CMP Latest Ref Rng & Units 10/11/2014  Glucose 65 - 99 mg/dL 94  BUN 6 - 20 mg/dL 12  Creatinine 2.95 - 2.84 mg/dL 1.32  Sodium 440 - 102 mmol/L 136  Potassium 3.5 - 5.1 mmol/L 3.8  Chloride 101 - 111 mmol/L 108  CO2 22 - 32 mmol/L 22  Calcium 8.9 - 10.3 mg/dL 9.0  Total Protein 6.5 - 8.1 g/dL 7.5  Total Bilirubin 0.3 - 1.2 mg/dL 0.4  Alkaline Phos 38 - 126 U/L 83  AST 15 - 41 U/L 13(L)  ALT 14 - 54 U/L 10(L)    Discharge instruction: per After Visit Summary and "Baby and Me Booklet".  After visit meds:  Allergies as of 10/03/2017   No Known Allergies  Medication List    STOP taking these medications   acetaminophen 500 MG tablet Commonly known as:  TYLENOL   Prenatal Vitamins 28-0.8 MG Tabs     TAKE these medications   ibuprofen 600 MG tablet Commonly known as:  ADVIL,MOTRIN Take 1 tablet (600 mg total) by mouth every 6 (six) hours.       Diet: routine diet  Activity: Advance as tolerated. Pelvic rest for 6 weeks.   Outpatient follow up:6 weeks Follow up Appt: Future Appointments  Date Time Provider Department Center  11/14/2017  3:30 PM Cheral Marker, CNM [facility]   Follow up Visit:No follow-ups on file.  Postpartum contraception: Nexplanon  Newborn Data: Live born female  Birth Weight: 7 lb (3175 g) APGAR: 9, 9  Newborn Delivery   Birth date/time:  10/02/2017 04:11:00 Delivery type:  Vaginal, Spontaneous     Baby Feeding: Breast Disposition:home with mother   10/03/2017 Myrene Buddy, MD  Midwife attestation I have seen and examined this patient and agree with above documentation in the resident's note.   Erica [name] is a 22 y.o. [phone] s/p NSVD.   Pain is well controlled.  Plan for birth control is Nexplanon.  Method of Feeding: breast  PE:  BP 108/65   Pulse 80   Temp 98.7 F (37.1 C)   Resp 18   Ht  (1.6 m)   Wt 205  lb 4 oz (93.1 kg)   LMP 11/14/2016 (Exact Date)   SpO2 99%   BMI 36.36 kg/m  Gen: well appearing Heart: reg rate Lungs: normal WOB Fundus firm Ext: soft, no pain, no edema  Recent Labs    10/01/17 2040  HGB 11.6*  HCT 34.3*     Plan: discharge today - postpartum care discussed - f/u clinic in 6 weeks for postpartum visit   Sharen Counter, CNM 12:18 PM

## 2017-10-03 NOTE — Lactation Note (Signed)
This note was copied from a baby's chart. Lactation Consultation Note  Patient Name: [name] Date: 10/03/2017  Pecola Leisure is cluster feeding.  Mom states baby is latching easily.  Reassured mom cluster feeding is normal today.  No questions or concerns. Encouraged to call out for assist prn.   Maternal Data    Feeding Feeding Type: Breast Fed Length of feed: 15 min  LATCH Score Latch: Grasps breast easily, tongue down, lips flanged, rhythmical sucking.  Audible Swallowing: A few with stimulation  Type of Nipple: Everted at rest and after stimulation  Comfort (Breast/Nipple): Soft / non-tender  Hold (Positioning): Assistance needed to correctly position infant at breast and maintain latch.  LATCH Score: 8  Interventions    Lactation Tools Discussed/Used     Consult Status      Huston Foley 10/03/2017, 2:57 PM

## 2017-10-03 NOTE — Progress Notes (Signed)
Post Partum Day 1 Subjective: no complaints, up ad lib, voiding and tolerating PO. Having some difficulty with breastfeeding.  Objective: Blood pressure 107/73, pulse 63, temperature 99.4 F (37.4 C), temperature source Oral, resp. rate 18, height  (1.6 m), weight 205 lb 4 oz (93.1 kg), last menstrual period 11/14/2016, SpO2 100 %.  Physical Exam:  General: alert, cooperative and no distress Lochia: appropriate Uterine Fundus: firm Incision: n/a DVT Evaluation: No evidence of DVT seen on physical exam. Negative Homan's sign. No cords or calf tenderness. No significant calf/ankle edema.  Recent Labs    10/01/17 2040  HGB 11.6*  HCT 34.3*    Assessment/Plan: Plan for discharge tomorrow, Breastfeeding, Lactation consult and Contraception Nexplanon   LOS: 2 days   Donette Larry, CNM 10/03/2017, 12:39 PM

## 2017-10-04 NOTE — Lactation Note (Signed)
This note was copied from a baby's chart. Lactation Consultation Note  Patient Name: Erica [name] [id number] Date: 10/04/2017 Reason for consult: Follow-up assessment Mom started supplementing with formula last evening due to cluster feeding.  Instructed to always put baby to breast first.  Reminded to pump breasts anytime baby doesn't go to breast or receives a bottle.  Mom has a DEBP for home use.  Lactation outpatient services and support reviewed and encouraged prn.  Maternal Data    Feeding    LATCH Score                   Interventions    Lactation Tools Discussed/Used     Consult Status Consult Status: Complete Follow-up type: Call as needed    Huston Foley 10/04/2017, 9:18 AM

## 2017-11-11 ENCOUNTER — Encounter (INDEPENDENT_AMBULATORY_CARE_PROVIDER_SITE_OTHER): Payer: Self-pay

## 2017-11-11 ENCOUNTER — Ambulatory Visit (INDEPENDENT_AMBULATORY_CARE_PROVIDER_SITE_OTHER): Payer: Medicaid Other | Admitting: Women's Health

## 2017-11-11 ENCOUNTER — Encounter: Payer: Self-pay | Admitting: *Deleted

## 2017-11-11 NOTE — Progress Notes (Signed)
POSTPARTUM VISIT Patient name: Erica [name] MRN [id number]  Date of birth: 1996/02/13 Chief Complaint:   postpartum visit (interested in Nexplanon)  History of Present Illness:   Erica [name] is a 22 y.o. [phone] African American female being seen today for a postpartum visit. She is 5 weeks postpartum following a spontaneous vaginal delivery at 39.5 gestational weeks. Anesthesia: epidural. I have fully reviewed the prenatal and intrapartum course. Pregnancy uncomplicated. Postpartum course has been uncomplicated. Bleeding on period now. Bowel function is normal. Bladder function is normal.  Patient is sexually active. Last sexual activity: ~3wks ago.  Contraception method is condoms and wants Nexplanon.  [place] Postpartum Depression Screening: negative. Score 3.   Last pap 03/2016.  Results were normal .  Patient's last menstrual period was 11/08/2017.  Baby's course has been uncomplicated. Baby is feeding by breast & bottle, milk supply decreased.  Review of Systems:   Pertinent items are noted in HPI Denies Abnormal vaginal discharge w/ itching/odor/irritation, headaches, visual changes, shortness of breath, chest pain, abdominal pain, severe nausea/vomiting, or problems with urination or bowel movements. Pertinent History Reviewed:  Reviewed past medical,surgical, obstetrical and family history.  Reviewed problem list, medications and allergies. OB History  Gravida Para Term Preterm AB Living  4 3 3   1 3   SAB TAB Ectopic Multiple Live Births  1     0 3    # Outcome Date GA Lbr Len/2nd Weight Sex Delivery Anes PTL Lv  4 SAB 01/2015 [redacted]w[redacted]d         3 Term 07/16/14 [redacted]w[redacted]d 16:48 / 00:15 8 lb 7.4 oz (3.839 kg) F Vag-Spont EPI N LIV  2 Term 06/27/13 [redacted]w[redacted]d 07:30 / 00:04 6 lb 13.4 oz (3.1 kg) F Vag-Spont None N LIV  1 Term     M Vag-Spont   LIV   Physical Assessment:   Vitals:   11/11/17 0917  BP: 124/76  Pulse: 79  Weight: 178 lb (80.7 kg)  Height: 5\' 3"  (1.6  m)  Body mass index is 31.53 kg/m.       Physical Examination:   General appearance: alert, well appearing, and in no distress  Mental status: alert, oriented to person, place, and time  Skin: warm & dry   Cardiovascular: normal heart rate noted   Respiratory: normal respiratory effort, no distress   Breasts: deferred, no complaints   Abdomen: soft, non-tender   Pelvic: VULVA: normal appearing vulva with no masses, tenderness or lesions, UTERUS: uterus is normal size, shape, consistency and nontender  Rectal: no hemorrhoids  Extremities: no edema       No results found for this or any previous visit (from the past 24 hour(s)).  Assessment & Plan:  1) Postpartum exam 2) 5 wks s/p SVB 3) Breast & bottlefeeding 4) Depression screening 5) Contraception counseling, pt prefers abstinence until Nexplanon insertion  6) Decreased milk supply, discussed and gave printed tips  Meds: No orders of the defined types were placed in this encounter.   Follow-up: Return for asap for nexplanon.   No orders of the defined types were placed in this encounter.   Cheral Marker CNM, [facility] 11/11/2017 9:59 AM

## 2017-11-11 NOTE — Patient Instructions (Addendum)
NO SEX UNTIL AFTER YOU GET YOUR BIRTH CONTROL   Tips To Increase Milk Supply  Lots of water! Enough so that your urine is clear  Plenty of calories, if you're not getting enough calories, your milk supply can decrease  Breastfeed/pump often, every 2-3 hours x 20-4130mins  Fenugreek 3 pills 3 times a day, this may make your urine smell like maple syrup  Mother's Milk Tea  Lactation cookies, google for the recipe  Real oatmeal    Etonogestrel implant What is this medicine? ETONOGESTREL (et oh noe JES trel) is a contraceptive (birth control) device. It is used to prevent pregnancy. It can be used for up to 3 years. This medicine may be used for other purposes; ask your health care provider or pharmacist if you have questions. COMMON BRAND NAME(S): Implanon, Nexplanon What should I tell my health care provider before I take this medicine? They need to know if you have any of these conditions: -abnormal vaginal bleeding -blood vessel disease or blood clots -cancer of the breast, cervix, or liver -depression -diabetes -gallbladder disease -headaches -heart disease or recent heart attack -high blood pressure -high cholesterol -kidney disease -liver disease -renal disease -seizures -tobacco smoker -an unusual or allergic reaction to etonogestrel, other hormones, anesthetics or antiseptics, medicines, foods, dyes, or preservatives -pregnant or trying to get pregnant -breast-feeding How should I use this medicine? This device is inserted just under the skin on the inner side of your upper arm by a health care professional. Talk to your pediatrician regarding the use of this medicine in children. Special care may be needed. Overdosage: If you think you have taken too much of this medicine contact a poison control center or emergency room at once. NOTE: This medicine is only for you. Do not share this medicine with others. What if I miss a dose? This does not apply. What may  interact with this medicine? Do not take this medicine with any of the following medications: -amprenavir -bosentan -fosamprenavir This medicine may also interact with the following medications: -barbiturate medicines for inducing sleep or treating seizures -certain medicines for fungal infections like ketoconazole and itraconazole -grapefruit juice -griseofulvin -medicines to treat seizures like carbamazepine, felbamate, oxcarbazepine, phenytoin, topiramate -modafinil -phenylbutazone -rifampin -rufinamide -some medicines to treat HIV infection like atazanavir, indinavir, lopinavir, nelfinavir, tipranavir, ritonavir -St. John's wort This list may not describe all possible interactions. Give your health care provider a list of all the medicines, herbs, non-prescription drugs, or dietary supplements you use. Also tell them if you smoke, drink alcohol, or use illegal drugs. Some items may interact with your medicine. What should I watch for while using this medicine? This product does not protect you against HIV infection (AIDS) or other sexually transmitted diseases. You should be able to feel the implant by pressing your fingertips over the skin where it was inserted. Contact your doctor if you cannot feel the implant, and use a non-hormonal birth control method (such as condoms) until your doctor confirms that the implant is in place. If you feel that the implant may have broken or become bent while in your arm, contact your healthcare provider. What side effects may I notice from receiving this medicine? Side effects that you should report to your doctor or health care professional as soon as possible: -allergic reactions like skin rash, itching or hives, swelling of the face, lips, or tongue -breast lumps -changes in emotions or moods -depressed mood -heavy or prolonged menstrual bleeding -pain, irritation, swelling, or bruising at  the insertion site -scar at site of insertion -signs  of infection at the insertion site such as fever, and skin redness, pain or discharge -signs of pregnancy -signs and symptoms of a blood clot such as breathing problems; changes in vision; chest pain; severe, sudden headache; pain, swelling, warmth in the leg; trouble speaking; sudden numbness or weakness of the face, arm or leg -signs and symptoms of liver injury like dark yellow or brown urine; general ill feeling or flu-like symptoms; light-colored stools; loss of appetite; nausea; right upper belly pain; unusually weak or tired; yellowing of the eyes or skin -unusual vaginal bleeding, discharge -signs and symptoms of a stroke like changes in vision; confusion; trouble speaking or understanding; severe headaches; sudden numbness or weakness of the face, arm or leg; trouble walking; dizziness; loss of balance or coordination Side effects that usually do not require medical attention (report to your doctor or health care professional if they continue or are bothersome): -acne -back pain -breast pain -changes in weight -dizziness -general ill feeling or flu-like symptoms -headache -irregular menstrual bleeding -nausea -sore throat -vaginal irritation or inflammation This list may not describe all possible side effects. Call your doctor for medical advice about side effects. You may report side effects to FDA at 1-800-FDA-1088. Where should I keep my medicine? This drug is given in a hospital or clinic and will not be stored at home. NOTE: This sheet is a summary. It may not cover all possible information. If you have questions about this medicine, talk to your doctor, pharmacist, or health care provider.  2018 Elsevier/Gold Standard (2015-12-01 11:19:22)

## 2017-11-13 ENCOUNTER — Encounter: Payer: Self-pay | Admitting: *Deleted

## 2017-11-13 ENCOUNTER — Telehealth: Payer: Self-pay | Admitting: Women's Health

## 2017-11-13 NOTE — Telephone Encounter (Signed)
Pt needs a note to go back to work/ had appointment on Monday and went back to work today and they need a release note

## 2017-11-14 ENCOUNTER — Ambulatory Visit: Payer: Medicaid Other | Admitting: Women's Health

## 2017-11-18 ENCOUNTER — Encounter: Payer: Self-pay | Admitting: Women's Health

## 2017-11-18 ENCOUNTER — Ambulatory Visit (INDEPENDENT_AMBULATORY_CARE_PROVIDER_SITE_OTHER): Payer: Medicaid Other | Admitting: Women's Health

## 2017-11-18 VITALS — BP 126/76 | HR 71 | Ht 63.0 in | Wt 183.0 lb

## 2017-11-18 DIAGNOSIS — Z3202 Encounter for pregnancy test, result negative: Secondary | ICD-10-CM | POA: Diagnosis not present

## 2017-11-18 DIAGNOSIS — Z30017 Encounter for initial prescription of implantable subdermal contraceptive: Secondary | ICD-10-CM

## 2017-11-18 DIAGNOSIS — Z3049 Encounter for surveillance of other contraceptives: Secondary | ICD-10-CM

## 2017-11-18 LAB — POCT URINE PREGNANCY: Preg Test, Ur: NEGATIVE

## 2017-11-18 MED ORDER — ETONOGESTREL 68 MG ~~LOC~~ IMPL
68.0000 mg | DRUG_IMPLANT | Freq: Once | SUBCUTANEOUS | Status: AC
  Administered 2017-11-18: 68 mg via SUBCUTANEOUS

## 2017-11-18 NOTE — Patient Instructions (Signed)

## 2017-11-18 NOTE — Progress Notes (Signed)
NEXPLANON INSERTION Patient name: Erica [name] MRN [id number]  Date of birth: [birth year] Subjective Findings:   Erica [name] is a 22 y.o. [phone] African American female being seen today for insertion of a Nexplanon.   Patient's last menstrual period was 11/08/2017. Last sexual intercourse was >2wks ago Last pap11/2017. Results were:  normal  Risks/benefits/side effects of Nexplanon have been discussed and her questions have been answered.  Specifically, a failure rate of 05/998 has been reported, with an increased failure rate if pt takes St. John's Wort and/or antiseizure medicaitons.  She is aware of the common side effect of irregular bleeding, which the incidence of decreases over time. Signed copy of informed consent in chart.  Pertinent History Reviewed:   Reviewed past medical,surgical, social, obstetrical and family history.  Reviewed problem list, medications and allergies. Objective Findings & Procedure:    Vitals:   11/18/17 1523  BP: 126/76  Pulse: 71  Weight: 183 lb (83 kg)  Height: 5\' 3"  (1.6 m)  Body mass index is 32.42 kg/m.  Results for orders placed or performed in visit on 11/18/17 (from the past 24 hour(s))  POCT urine pregnancy   Collection Time: 11/18/17  3:25 PM  Result Value Ref Range   Preg Test, Ur Negative Negative     Time out was performed.  She is right-handed, so her left arm, approximately 10cm from the medial epicondyle and 3-5cm posterior to the sulcus, was cleansed with alcohol and anesthetized with 2cc of 2% Lidocaine.  The area was cleansed again with betadine and the Nexplanon was inserted per manufacturer's recommendations without difficulty.  3 steri-strips and pressure bandage were applied. The patient tolerated the procedure well.  Assessment & Plan:   1) Nexplanon insertion Pt was instructed to keep the area clean and dry, remove pressure bandage in 24 hours, and keep insertion site covered with the steri-strip for 3-5  days.  Back up contraception was recommended for 2 weeks.  She was given a card indicating date Nexplanon was inserted and date it needs to be removed. Follow-up PRN problems.  Orders Placed This Encounter  Procedures  . POCT urine pregnancy    Follow-up: Return in about 1 year (around 11/19/2018) for Pap & physical.  Cheral Marker CNM, [facility] 11/18/2017 3:47 PM

## 2017-12-31 ENCOUNTER — Encounter: Payer: Self-pay | Admitting: Women's Health

## 2017-12-31 ENCOUNTER — Ambulatory Visit: Payer: BLUE CROSS/BLUE SHIELD | Admitting: Women's Health

## 2017-12-31 DIAGNOSIS — F53 Postpartum depression: Secondary | ICD-10-CM | POA: Insufficient documentation

## 2017-12-31 DIAGNOSIS — O99345 Other mental disorders complicating the puerperium: Principal | ICD-10-CM

## 2017-12-31 MED ORDER — SERTRALINE HCL 25 MG PO TABS: 25.0000 mg | ORAL_TABLET | Freq: Every day | ORAL | 6 refills | Status: DC

## 2017-12-31 NOTE — Progress Notes (Signed)
GYN VISIT Patient name: Erica [name] MRN [id number]  Date of birth: 05/18/1996 Chief Complaint:   Depression  History of Present Illness:   Erica [name] is a 22 y.o. [phone] African American female s/p SVB being seen today for postpartum depression. States she has tried to push it off thinking it would go away but it is not. EPDS at pp visit on 6/17 was 3, states she may not have been completely truthful w/ her answers. Had Nexplanon placed 6/24, has worsened since then, but was definitely there prior to insertion. Her father who lived in [place] passed away early January 02, 2023, which also worsened the depression. States she had after her last baby as well, but just never told anyone and it eventually went away. Denies SI/HI/II. Appetite is up and down, not much sleep, doesn't find joy in things she used to. Offered meds/therapy, wants meds only for right now, wants to think about therapy.  Depression screen [facility] 2/9 12/31/2017 02/27/2017 06/12/2016  Decreased Interest 3 0 0  Down, Depressed, Hopeless 3 0 0  PHQ - 2 Score 6 0 0  Altered sleeping 2 0 -  Tired, decreased energy 3 2 -  Change in appetite 3 0 -  Feeling bad or failure about yourself  3 0 -  Trouble concentrating 1 0 -  Moving slowly or fidgety/restless 0 0 -  Suicidal thoughts 0 0 -  PHQ-9 Score 18 2 -  Difficult doing work/chores Somewhat difficult - -     Patient's last menstrual period was 12/02/2017. The current method of family planning is nexplanon. Last pap 03/2016. Results were:  normal Review of Systems:   Pertinent items are noted in HPI Denies fever/chills, dizziness, headaches, visual disturbances, fatigue, shortness of breath, chest pain, abdominal pain, vomiting, abnormal vaginal discharge/itching/odor/irritation, problems with periods, bowel movements, urination, or intercourse unless otherwise stated above.  Pertinent History Reviewed:  Reviewed past medical,surgical, social, obstetrical and family  history.  Reviewed problem list, medications and allergies. Physical Assessment:   Vitals:   12/31/17 1600  BP: 110/71  Pulse: 80  Weight: 184 lb 6.4 oz (83.6 kg)  Height: 5\' 3"  (1.6 m)  Body mass index is 32.66 kg/m.       Physical Examination:   General appearance: alert, well appearing, and in no distress, tearful  Mental status: alert, oriented to person, place, and time  Skin: warm & dry   Cardiovascular: normal heart rate noted  Respiratory: normal respiratory effort, no distress  Abdomen: soft, non-tender   Pelvic: examination not indicated  Extremities: no edema   No results found for this or any previous visit (from the past 24 hour(s)).  Assessment & Plan:  1) Postpartum depression> rx zoloft 25mg  daily, understands can take a few weeks before starts to notice improvement. Let us know if decides for therapy. F/U 4wks  Meds:  Meds ordered this encounter  Medications  . sertraline (ZOLOFT) 25 MG tablet    Sig: Take 1 tablet (25 mg total) by mouth daily.    Dispense:  30 tablet    Refill:  6    Order Specific Question:   Supervising Provider    Answer:   Despina Hidden, LUTHER H [[id number]]    No orders of the defined types were placed in this encounter.   Return in about 1 month (around 01/28/2018) for F/U w/ me.  Cheral Marker CNM, [facility] 12/31/2017 4:34 PM

## 2017-12-31 NOTE — Patient Instructions (Signed)
[facility] [place] [phone]   Perinatal Depression When a woman feels excessive sadness, anger, or anxiety during pregnancy or during the first 12 months after she gives birth, she has a condition called perinatal depression. Depression can interfere with work, school, relationships, and other everyday activities. If it is not managed properly, it can also cause problems in the mother and her baby. Sometimes, perinatal depression is left untreated because symptoms are thought to be normal mood swings during and right after pregnancy. If you have symptoms of depression, it is important to talk with your health care provider. What are the causes? The exact cause of this condition is not known. Hormonal changes during and after pregnancy may play a role in causing perinatal depression. What increases the risk? You are more likely to develop this condition if:  You have a personal or family history of depression, anxiety, or mood disorders.  You experience a stressful life event during pregnancy, such as the death of a loved one.  You have a lot of regular life stress.  You do not have support from family members or loved ones, or you are in an abusive relationship.  What are the signs or symptoms? Symptoms of this condition include:  Feeling sad or hopeless.  Feelings of guilt.  Feeling irritable or overwhelmed.  Changes in your appetite.  Lack of energy or motivation.  Sleep problems.  Difficulty concentrating or completing tasks.  Loss of interest in hobbies or relationships.  Headaches or stomach problems that do not go away.  How is this diagnosed? This condition is diagnosed based on a physical exam and mental evaluation. In some cases, your health care provider may use a depression screening tool. These tools include a list of questions that can help a health care provider diagnose depression. Your health care provider may refer you to a mental  health expert who specializes in depression. How is this treated? This condition may be treated with:  Medicines. Your health care provider will only give you medicines that have been proven safe for pregnancy and breastfeeding.  Talk therapy with a mental health professional to help change your patterns of thinking (cognitive behavioral therapy).  Support groups.  Brain stimulation or light therapies.  Stress reduction therapies, such as mindfulness.  Follow these instructions at home: Lifestyle  Do not use any products that contain nicotine or tobacco, such as cigarettes and e-cigarettes. If you need help quitting, ask your health care provider.  Do not use alcohol when you are pregnant. After your baby is born, limit alcohol intake to no more than 1 drink a day. One drink equals 12 oz of beer, 5 oz of wine, or 1 oz of hard liquor.  Consider joining a support group for new mothers. Ask your health care provider for recommendations.  Take good care of yourself. Make sure you: ? Get plenty of sleep. If you are having trouble sleeping, talk with your health care provider. ? Eat a healthy diet. This includes plenty of fruits and vegetables, whole grains, and lean proteins. ? Exercise regularly, as told by your health care provider. Ask your health care provider what exercises are safe for you. General instructions  Take over-the-counter and prescription medicines only as told by your health care provider.  Talk with your partner or family members about your feelings during pregnancy. Share any concerns or anxieties that you may have.  Ask for help with tasks or chores when you need it. Ask friends and  family members to provide meals, watch your children, or help with cleaning.  Keep all follow-up visits as told by your health care provider. This is important. Contact a health care provider if:  You (or people close to you) notice that you have any symptoms of depression.  You  have depression and your symptoms get worse.  You experience side effects from medicines, such as nausea or sleep problems. Get help right away if:  You feel like hurting yourself, your baby, or someone else. If you ever feel like you may hurt yourself or others, or have thoughts about taking your own life, get help right away. You can go to your nearest emergency department or call:  Your local emergency services (911 in the [place]  A suicide crisis helpline, such as the National Suicide Prevention Lifeline at 714-660-3344. This is open 24 hours a day.  Summary  Perinatal depression is when a woman feels excessive sadness, anger, or anxiety during pregnancy or during the first 12 months after she gives birth.  If perinatal depression is not treated, it can lead to health problems for the mother and her baby.  This condition is treated with medicines, talk therapy, stress reduction therapies, or a combination of two or more treatments.  Talk with your partner or family members about your feelings. Do not be afraid to ask for help. This information is not intended to replace advice given to you by your health care provider. Make sure you discuss any questions you have with your health care provider. Document Released: 07/11/2016 Document Revised: 07/11/2016 Document Reviewed: 07/11/2016 Elsevier Interactive Patient Education  [employer].

## 2018-01-28 ENCOUNTER — Ambulatory Visit (INDEPENDENT_AMBULATORY_CARE_PROVIDER_SITE_OTHER): Payer: BLUE CROSS/BLUE SHIELD | Admitting: Women's Health

## 2018-01-28 ENCOUNTER — Encounter: Payer: Self-pay | Admitting: Women's Health

## 2018-01-28 VITALS — BP 134/79 | HR 80 | Ht 63.0 in | Wt 186.0 lb

## 2018-01-28 DIAGNOSIS — O99345 Other mental disorders complicating the puerperium: Secondary | ICD-10-CM

## 2018-01-28 DIAGNOSIS — F53 Postpartum depression: Secondary | ICD-10-CM | POA: Diagnosis not present

## 2018-01-28 NOTE — Progress Notes (Signed)
   GYN VISIT Patient name: Erica [name] MRN [id number]  Date of birth: 06-21-1995 Chief Complaint:   Follow-up  History of Present Illness:   Erica [name] is a 22 y.o. [phone] African American female being seen today for f/u on zoloft 25mg  that was rx'd 8/6 at pp for ppd.  States she feels 'so much' better! Can't believe the difference since starting meds. Feels depression may have been going on before having baby, so feels she may want to stay on zoloft.  Depression screen [facility] 2/9 01/28/2018 12/31/2017 02/27/2017 06/12/2016  Decreased Interest 1 3 0 0  Down, Depressed, Hopeless 0 3 0 0  PHQ - 2 Score 1 6 0 0  Altered sleeping 2 2 0 -  Tired, decreased energy 1 3 2  -  Change in appetite 2 3 0 -  Feeling bad or failure about yourself  1 3 0 -  Trouble concentrating 0 1 0 -  Moving slowly or fidgety/restless 0 0 0 -  Suicidal thoughts 0 0 0 -  PHQ-9 Score 7 18 2  -  Difficult doing work/chores - Somewhat difficult - -     Patient's last menstrual period was 01/20/2018. The current method of family planning is nexplanon placed 11/18/17. Last pap 03/2016. Results were:  normal Review of Systems:   Pertinent items are noted in HPI Denies fever/chills, dizziness, headaches, visual disturbances, fatigue, shortness of breath, chest pain, abdominal pain, vomiting, abnormal vaginal discharge/itching/odor/irritation, problems with periods, bowel movements, urination, or intercourse unless otherwise stated above.  Pertinent History Reviewed:  Reviewed past medical,surgical, social, obstetrical and family history.  Reviewed problem list, medications and allergies. Physical Assessment:   Vitals:   01/28/18 1523  BP: 134/79  Pulse: 80  Weight: 186 lb (84.4 kg)  Height: 5\' 3"  (1.6 m)  Body mass index is 32.95 kg/m.       Physical Examination:   General appearance: alert, well appearing, and in no distress  Mental status: alert, oriented to person, place, and time  Skin:  warm & dry   Cardiovascular: normal heart rate noted  Respiratory: normal respiratory effort, no distress  Abdomen: soft, non-tender   Pelvic: examination not indicated  Extremities: no edema   No results found for this or any previous visit (from the past 24 hour(s)).  Assessment & Plan:  1) PPD> much better on zoloft 25mg , continue  Meds: No orders of the defined types were placed in this encounter.   No orders of the defined types were placed in this encounter.   Return for next Aug for pap & physical.  Cheral Marker CNM, [facility] 01/28/2018 3:40 PM

## 2018-02-18 ENCOUNTER — Other Ambulatory Visit: Payer: Self-pay | Admitting: Advanced Practice Midwife

## 2018-02-18 ENCOUNTER — Telehealth: Payer: Self-pay | Admitting: *Deleted

## 2018-02-18 MED ORDER — MEGESTROL ACETATE 40 MG PO TABS: ORAL_TABLET | ORAL | 3 refills | Status: DC

## 2018-02-18 NOTE — Telephone Encounter (Signed)
Patient states she has been bleeding for the past 2 weeks.  Nexplanon is still in place and she has had this problem in the past.  She is requesting Megace. Please advise.

## 2018-03-05 ENCOUNTER — Encounter: Payer: Self-pay | Admitting: Pediatrics

## 2018-03-05 ENCOUNTER — Ambulatory Visit (INDEPENDENT_AMBULATORY_CARE_PROVIDER_SITE_OTHER): Payer: BLUE CROSS/BLUE SHIELD | Admitting: Pediatrics

## 2018-03-05 VITALS — BP 125/84 | HR 82 | Temp 100.3°F | Ht 63.0 in | Wt 186.6 lb

## 2018-03-05 DIAGNOSIS — Z6833 Body mass index (BMI) 33.0-33.9, adult: Secondary | ICD-10-CM

## 2018-03-05 DIAGNOSIS — F339 Major depressive disorder, recurrent, unspecified: Secondary | ICD-10-CM

## 2018-03-05 MED ORDER — SERTRALINE HCL 50 MG PO TABS: 50.0000 mg | ORAL_TABLET | Freq: Every day | ORAL | 1 refills | Status: DC

## 2018-03-05 NOTE — Progress Notes (Signed)
  Subjective:   Patient ID: Erica [name], female    DOB: July 13, 1995, 22 y.o.   MRN: [id number] CC: appeal form  HPI: [name] is a 22 y.o. female   Five months postpartum.  Next pregnancy weight 205 pounds.  Now 186 pounds.  Has appeal letter for insurance company about why she is not at their weight goal.   Patient would like to get weight down to 160 pounds.  Walks regularly throughout the day.  Has a hard time with snacking.  Mood has been slightly down.  Recently started on sertraline 25 mg through gynecology for postpartum depression.  She thinks it is helping some but not enough.  She does think she is able to do all that she needs to do, taking care of 57-month-old and other kids at home in addition to working.  Recent URI symptoms, resolving.  Relevant past medical, surgical, family and social history reviewed. Allergies and medications reviewed and updated. Social History   Tobacco Use  Smoking Status Never Smoker  Smokeless Tobacco Never Used   ROS: Per HPI   Objective:    BP 125/84   Pulse 82   Temp 100.3 F (37.9 C) (Oral)   Ht 5\' 3"  (1.6 m)   Wt 186 lb 9.6 oz (84.6 kg)   BMI 33.05 kg/m   Wt Readings from Last 3 Encounters:  03/05/18 186 lb 9.6 oz (84.6 kg)  01/28/18 186 lb (84.4 kg)  12/31/17 184 lb 6.4 oz (83.6 kg)    Gen: NAD, alert, cooperative with exam, NCAT EYES: EOMI, no conjunctival injection, or no icterus ENT:   OP without erythema LYMPH: no cervical LAD CV: NRRR, normal S1/S2, no murmur, distal pulses 2+ b/l Resp: CTABL, no wheezes, normal WOB Abd: +BS, soft, NTND.  Ext: No edema, warm Neuro: Alert and oriented, strength equal b/l UE and LE, coordination grossly normal MSK: normal muscle bulk Psych: normal affect, mood is down at times, no thoughts of self harm  Assessment & Plan:  Aili was seen today for appeal form.  Diagnoses and all orders for this visit:  Depression, recurrent (HCC) Some ongoing symptoms,  safe at home, will increase below to 50mg . Return precautions discussed. -     sertraline (ZOLOFT) 50 MG tablet; Take 1 tablet (50 mg total) by mouth daily.  BMI 33.0-33.9,adult Lifestyle changes discussed. Would like to lose apprx 5 lbs per 3 months over next few months. Appeal form filled out for insurance. Will refer to nutrition for further assistance with nutrition choices. -     Amb ref to Medical Nutrition Therapy-MNT  Follow up plan: Return in about 6 months (around 09/04/2018). Rex Kras, MD [place]

## 2018-04-07 ENCOUNTER — Encounter: Payer: Self-pay | Admitting: Dietician

## 2018-04-07 ENCOUNTER — Encounter: Payer: Medicaid Other | Attending: Pediatrics | Admitting: Dietician

## 2018-04-07 DIAGNOSIS — Z713 Dietary counseling and surveillance: Secondary | ICD-10-CM | POA: Insufficient documentation

## 2018-04-07 DIAGNOSIS — Z6833 Body mass index (BMI) 33.0-33.9, adult: Secondary | ICD-10-CM | POA: Insufficient documentation

## 2018-04-07 DIAGNOSIS — E669 Obesity, unspecified: Secondary | ICD-10-CM | POA: Insufficient documentation

## 2018-04-07 DIAGNOSIS — Z6834 Body mass index (BMI) 34.0-34.9, adult: Secondary | ICD-10-CM

## 2018-04-07 NOTE — Patient Instructions (Signed)
Consider leaving the sugar out of the coffee.  Be mindful about how much creamer that you are using. Plan ahead.  Muffin cup eggs Consider choices when eating out.  Avoid fried. Meal plan.   Mindful eating:  Eat slowly, stop when you are satisfied  Aim for 20-30 minutes for a meal.  It takes this long for your brain to get the fullness signal.  Before a snack ask, "Am I hungry or eating for another reason?" When I am bored, I will  ________ rather than eat. If you have a snack, take a small portion and put it in a bowl and enjoy.   Resources:  Eat Right.org  Choose My Plate.gov

## 2018-04-07 NOTE — Progress Notes (Signed)
Medical Nutrition Therapy:  Appt start time: 1600 end time:  8127. Arrived at wrong office  Assessment:  Primary concerns today: Patient is here today alone.  She states that for cheaper insurance, she needs to meet BMI standards.  She would like to lose weight for herself as well.  Weight hx: 193 lbs today (6 months post partum), highest adult weight 145 lbs lowest adult weight Goal 160 lbs   Patient lives with her mover and 3 children ages 97,4, and 6 months.  Mom and patient share shopping and cooking.  Her mother has "tried every diet out there" and is currently wanting to lose weight.  Was trying to do the keto diet.  "Gave up on it at [place]".  Patient works for [employer].  Radio broadcast assistant with faxes.  Eats at work due to boredom.  She is also planning on going back to school in a couple of months online for [employer].  Preferred Learning Style:   No preference indicated   Learning Readiness:   Ready  MEDICATIONS: Nexplanon Birth Control- having side effect of chronic period and taking Megace to stop this.   DIETARY INTAKE:  Usual eating pattern includes 3 meals and 2 snacks per day. Avoided foods include soda.  Decreased her bread intake.  24-hr recall:  B (5:30 or 6:30 AM): 2-3 boiled eggs, toast OR ham and cheese croisant  Snk ( AM): vending machine (kit cats or muffins) OR fruit cup L (12 PM): Fast food (burger or chicken tenders and fries OR bacon, egg, and cheese biscuit, hashbrown) Snk ( PM): vending machine D (4:30-5 PM): chicken or hotdogs or hamburger (without bun), vegetables, baked beans or potatoes Snk ( PM): none Beverages: water, cranberry juice (1-2 cups daily), coffee with 2 tsp sugar and sweetened creamer  Usual physical activity: walks every other hour (five floors), caring for children.  Estimated energy needs: 1500 calories 60-70 g protein  Progress Towards Goal(s):  In progress.   Nutritional Diagnosis:  NB-1.1 Food and  nutrition-related knowledge deficit As related to Healthy eating habits to maintain a healthy weight.  As evidenced by BMI 34, patient report.    Intervention:  Nutrition counseling/education related to healthy meal planning and mindful eating.  Discussed eating out as well.  Discussed nutrition important for nourishing our body and eating habits need to be a lifestyle rather than a diet.  Consider leaving the sugar out of the coffee.  Be mindful about how much creamer that you are using. Plan ahead.  Muffin cup eggs Consider choices when eating out.  Avoid fried. Meal plan.   Mindful eating:  Eat slowly, stop when you are satisfied  Aim for 20-30 minutes for a meal.  It takes this long for your brain to get the fullness signal.  Before a snack ask, "Am I hungry or eating for another reason?" When I am bored, I will  ________ rather than eat. If you have a snack, take a small portion and put it in a bowl and enjoy.   Resources:  Eat Right.org  Choose My SamedayNews.com.cy   Teaching Method Utilized:  Visual Auditory Hands on  Handouts given during visit include:  1500 calorie sample meal plans  Weight loss cooking tips from AND  Weight loss tips from [place] ideas for eating out  Barriers to learning/adherence to lifestyle change: time  Demonstrated degree of understanding via:  Teach Back   Monitoring/Evaluation:  Dietary intake, exercise, and body  weight in 2 month(s).

## 2018-04-17 ENCOUNTER — Telehealth: Payer: Self-pay | Admitting: *Deleted

## 2018-04-17 ENCOUNTER — Other Ambulatory Visit: Payer: Self-pay | Admitting: Advanced Practice Midwife

## 2018-04-17 MED ORDER — ESTRADIOL 1 MG PO TABS: ORAL_TABLET | ORAL | 2 refills | Status: DC

## 2018-04-17 NOTE — Telephone Encounter (Signed)
Pt called requesting that a new medication be sent in to help with her bleeding.  She states that her nutritionist advised her that megestrol was also used in helping cancer pts keep weight on. She states that she has issues with weight and doesn't want to use this medication. Advised that I would send her request to a provider and  She could check with her pharmacy in the next 24 hours. Pt verbalized understanding/

## 2018-04-17 NOTE — Progress Notes (Signed)
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  Appetite ^, most likely from nexplanon, but doesn't want to add anything that could make it worse

## 2018-05-15 ENCOUNTER — Other Ambulatory Visit: Payer: Self-pay | Admitting: Pediatrics

## 2018-05-15 ENCOUNTER — Other Ambulatory Visit: Payer: BLUE CROSS/BLUE SHIELD

## 2018-05-15 DIAGNOSIS — J029 Acute pharyngitis, unspecified: Secondary | ICD-10-CM

## 2018-05-15 NOTE — Progress Notes (Signed)
Two daughters seen today, strep positive. Mom with some sore throat, no fevers. Otherwise feeling well. Negative rapid strep, will f/u culture.

## 2018-05-16 LAB — CULTURE, GROUP A STREP

## 2018-05-16 LAB — RAPID STREP SCREEN (MED CTR MEBANE ONLY): STREP GP A AG, IA W/REFLEX: NEGATIVE

## 2018-05-19 ENCOUNTER — Encounter: Payer: Self-pay | Admitting: Pediatrics

## 2018-05-19 LAB — CULTURE, GROUP A STREP

## 2018-05-19 MED ORDER — AMOXICILLIN 500 MG PO CAPS: 500.0000 mg | ORAL_CAPSULE | Freq: Two times a day (BID) | ORAL | 0 refills | Status: DC

## 2018-05-19 MED ORDER — AMOXICILLIN 500 MG PO CAPS: 500.0000 mg | ORAL_CAPSULE | Freq: Two times a day (BID) | ORAL | 0 refills | Status: AC

## 2018-05-19 NOTE — Addendum Note (Signed)
Addended byHilton Cork: Hatim Homann C on: 05/19/2018 11:02 AM   Modules accepted: Orders

## 2018-06-02 ENCOUNTER — Telehealth: Payer: Self-pay | Admitting: *Deleted

## 2018-06-02 ENCOUNTER — Ambulatory Visit: Payer: Medicaid Other | Admitting: Dietician

## 2018-06-02 NOTE — Telephone Encounter (Signed)
Pt called requesting a prescription for a yeast infection. Pt c/o thick white discharge, denies itching, states its "uncomfortable". She states that she tried Monistat 3 but it did not help. She says it usually doesn't. Advised that I would send her request to a provider and she could check with her pharmacy a little later today.

## 2018-06-02 NOTE — Telephone Encounter (Signed)
Informed pt that per Joellyn Haff, CNM, she would need an appt before medication can be prescribed. Pt connected to scheduling.

## 2018-06-04 ENCOUNTER — Ambulatory Visit (INDEPENDENT_AMBULATORY_CARE_PROVIDER_SITE_OTHER): Payer: Managed Care, Other (non HMO) | Admitting: Obstetrics and Gynecology

## 2018-06-04 ENCOUNTER — Other Ambulatory Visit: Payer: Self-pay | Admitting: Obstetrics and Gynecology

## 2018-06-04 ENCOUNTER — Other Ambulatory Visit: Payer: Self-pay

## 2018-06-04 ENCOUNTER — Encounter: Payer: Self-pay | Admitting: Obstetrics and Gynecology

## 2018-06-04 VITALS — BP 127/73 | HR 91 | Ht 63.0 in | Wt 194.0 lb

## 2018-06-04 DIAGNOSIS — N898 Other specified noninflammatory disorders of vagina: Secondary | ICD-10-CM

## 2018-06-04 LAB — POCT WET PREP (WET MOUNT): Trichomonas Wet Prep HPF POC: ABSENT

## 2018-06-04 NOTE — Progress Notes (Signed)
[facility] Clinic Visit  @DATE @            Patient name: Erica [name] MRN [id number]  Date of birth: 02-Jan-1996  CC & HPI:  Erica [name] is a 23 y.o. female presenting today for current vulvar itching.  She is used some Monistat again but still finds herself uncomfortable.  She has had white thick discharge.  She has a history of recurrent yeast infections usually which will respond to the Monistat, she refer prefers Diflucan.  She has not used a steroid cream to address the discomfort  ROS:  ROS No history of diabetes.  She had a normal glucose tolerance test with her last pregnancy.  Patient agrees to hemoglobin A1c test  Pertinent History Reviewed:   Reviewed: Significant for  Medical         Past Medical History:  Diagnosis Date  . Anemia   . Chlamydia 07/06/14  . Chlamydia infection 08/02/2014  . Chlamydia infection during pregnancy, antepartum 07/27/2014   Had +CHL 2/9 treated in hospital 2/19 will check POT 3/1  . Elevated BP 07/27/2014  . Ganglion cyst of wrist    left  . Pelvic pain in female 07/27/2014  . Pregnant 12/29/2014  . Screening for STD (sexually transmitted disease) 07/27/2014  . Sinus infection 12/29/2014  . Strep throat   . Vaginal discharge 12/29/2014  . Yeast infection 04/07/2013                              Surgical Hx:    Past Surgical History:  Procedure Laterality Date  . NO PAST SURGERIES     Medications: Reviewed & Updated - see associated section                       Current Outpatient Medications:  .  estradiol (ESTRACE) 1 MG tablet, 1-2 po daily as needed for bleeding, Disp: 60 tablet, Rfl: 2 .  ibuprofen (ADVIL,MOTRIN) 200 MG tablet, Take 400 mg by mouth as needed., Disp: , Rfl:  .  megestrol (MEGACE) 40 MG tablet, Take 3/day (at the same time) for 5 days; 2/day for 5 days, then 1/day PO prn bleeding (Patient not taking: Reported on 06/04/2018), Disp: 60 tablet, Rfl: 3 .  sertraline (ZOLOFT) 50 MG tablet, Take 1 tablet (50 mg total) by  mouth daily. (Patient not taking: Reported on 06/04/2018), Disp: 90 tablet, Rfl: 1   Social History: Reviewed -  reports that she has never smoked. She has never used smokeless tobacco.  Objective Findings:  Vitals: Blood pressure 127/73, pulse 91, height 5\' 3"  (1.6 m), weight 194 lb (88 kg).  PHYSICAL EXAMINATION General appearance - alert, well appearing, and in no distress, oriented to person, place, and time and overweight Mental status - alert, oriented to person, place, and time Skin -   PELVIC External genitalia -minimal erythema, thick white discharge does not appear classic yeast but may be influenced by the Monistat residual Vulva -as above Vagina -quite thick discharge.  KOH and wet prep collected Cervix -normal  Uterus -   Adnexa -  Wet [place] -KOH and wet prep performed.  No yeast no clue cells with rare white cell small round areas that I think represent the Monistat Rectal - normal rectal, no masses, rectal exam not indicated    Assessment & Plan:   A:  1. Partially treated recurrent monilia vaginitis  P:  1. Mycolog cream for topical use refill PRN 2. Diflucan 150 mg now and repeat in 3 days.  Refill x3  Admitted by written prescription due to computers shut down

## 2018-06-05 LAB — HGB A1C W/O EAG: Hgb A1c MFr Bld: 5.4 % (ref 4.8–5.6)

## 2018-07-17 ENCOUNTER — Telehealth: Payer: Self-pay

## 2018-07-17 ENCOUNTER — Other Ambulatory Visit: Payer: Self-pay | Admitting: Family Medicine

## 2018-07-17 DIAGNOSIS — Z20828 Contact with and (suspected) exposure to other viral communicable diseases: Secondary | ICD-10-CM

## 2018-07-17 MED ORDER — OSELTAMIVIR PHOSPHATE 75 MG PO CAPS: 75.0000 mg | ORAL_CAPSULE | Freq: Every day | ORAL | 0 refills | Status: AC

## 2018-07-17 NOTE — Telephone Encounter (Signed)
Sent in prophylactic tamiflu, once daily for 10 days.

## 2018-07-17 NOTE — Telephone Encounter (Signed)
Patient aware and verbalizes understanding. 

## 2018-07-17 NOTE — Telephone Encounter (Signed)
Patient states that son was dx with flu- michele sent in tamiflu for her daughter Reuel Boom since she has start having a fever. Patient would like to know if something can be sent in for her just in case she starts getting symptoms? Patient aware she does need to come in to est with Elon Jester asap- patient verbalizes understanding. Please advise and if approved send to [facility] in [place]

## 2018-09-08 ENCOUNTER — Telehealth: Payer: Self-pay | Admitting: Women's Health

## 2018-09-08 MED ORDER — FLUCONAZOLE 150 MG PO TABS: ORAL_TABLET | ORAL | 1 refills | Status: DC

## 2018-09-08 NOTE — Telephone Encounter (Signed)
Pt c/o thick white discharge with itching.

## 2018-09-08 NOTE — Telephone Encounter (Signed)
Patient called, stated that she has had a yeast infection x 2 days.  Having a discharge.  Itching has increased.  [employer]  [phone]

## 2018-09-08 NOTE — Telephone Encounter (Signed)
Pt says she has yeast infection, will rx diflucan

## 2018-11-18 ENCOUNTER — Other Ambulatory Visit: Payer: Self-pay | Admitting: Women's Health

## 2018-12-02 ENCOUNTER — Other Ambulatory Visit: Payer: Self-pay | Admitting: Adult Health

## 2018-12-05 ENCOUNTER — Telehealth: Payer: Self-pay | Admitting: *Deleted

## 2018-12-05 NOTE — Telephone Encounter (Signed)
Patient called wanting to schedule an appt to switch birth controls.

## 2018-12-05 NOTE — Telephone Encounter (Signed)
Pt is on Nexplanon and is having a lot of headaches and irregular bleeding. Pt is wanting to switch to pills. Call transferred to front desk for appt. [place]

## 2018-12-16 ENCOUNTER — Ambulatory Visit (INDEPENDENT_AMBULATORY_CARE_PROVIDER_SITE_OTHER): Payer: Medicaid Other | Admitting: Adult Health

## 2018-12-16 ENCOUNTER — Other Ambulatory Visit: Payer: Self-pay

## 2018-12-16 ENCOUNTER — Encounter: Payer: Self-pay | Admitting: Adult Health

## 2018-12-16 ENCOUNTER — Other Ambulatory Visit (HOSPITAL_COMMUNITY)
Admission: RE | Admit: 2018-12-16 | Discharge: 2018-12-16 | Disposition: A | Discharge status: Home / Self Care | Payer: Managed Care, Other (non HMO) | Source: Ambulatory Visit | Attending: Adult Health | Admitting: Adult Health

## 2018-12-16 ENCOUNTER — Encounter: Payer: Medicaid Other | Admitting: Obstetrics and Gynecology

## 2018-12-16 VITALS — BP 122/80 | HR 86 | Ht 63.0 in | Wt 198.0 lb

## 2018-12-16 DIAGNOSIS — N926 Irregular menstruation, unspecified: Secondary | ICD-10-CM

## 2018-12-16 DIAGNOSIS — Z01419 Encounter for gynecological examination (general) (routine) without abnormal findings: Secondary | ICD-10-CM | POA: Insufficient documentation

## 2018-12-16 DIAGNOSIS — Z975 Presence of (intrauterine) contraceptive device: Secondary | ICD-10-CM

## 2018-12-16 DIAGNOSIS — Z30011 Encounter for initial prescription of contraceptive pills: Secondary | ICD-10-CM

## 2018-12-16 DIAGNOSIS — Z0000 Encounter for general adult medical examination without abnormal findings: Secondary | ICD-10-CM | POA: Diagnosis not present

## 2018-12-16 DIAGNOSIS — Z113 Encounter for screening for infections with a predominantly sexual mode of transmission: Secondary | ICD-10-CM

## 2018-12-16 DIAGNOSIS — Z3009 Encounter for other general counseling and advice on contraception: Secondary | ICD-10-CM

## 2018-12-16 MED ORDER — LO LOESTRIN FE 1 MG-10 MCG / 10 MCG PO TABS: 1.0000 | ORAL_TABLET | Freq: Every day | ORAL | 11 refills | Status: DC

## 2018-12-16 NOTE — Progress Notes (Signed)
Patient ID: Erica [name], female   DOB: Sep 26, 1995, 23 y.o.   MRN: [id number] History of Present Illness: Erica [name] is a 23 year old black female, [id number], in for well woman gyn exam and pap, has Family planning medicaid and has nexplanon, which she wants removed due to irregular bleeding and weight gain and wants to get on OCs. (she was to get nexplanon removed today, but had to have physical first) PCP is Erica Lesches NP.   Current Medications, Allergies, Past Medical History, Past Surgical History, Family History and Social History were reviewed in [employer] record.     Review of Systems: Patient denies any headaches, hearing loss, fatigue, blurred vision, shortness of breath, chest pain, abdominal pain, problems with bowel movements, urination, or intercourse. No joint pain or mood swings. Has had irregular bleeding with nexplanon Has had weight gain with nexplanon     Physical Exam:BP 122/80 (BP Location: Right Arm, Patient Position: Sitting, Cuff Size: Normal)   Pulse 86   Ht 5\' 3"  (1.6 m)   Wt 198 lb (89.8 kg)   BMI 35.07 kg/m  General:  Well developed, well nourished, no acute distress Skin:  Warm and dry Neck:  Midline trachea, normal thyroid, good ROM, no lymphadenopathy Lungs; Clear to auscultation bilaterally Breast:  No dominant palpable mass, retraction, or nipple discharge Cardiovascular: Regular rate and rhythm Abdomen:  Soft, non tender, no hepatosplenomegaly Pelvic:  External genitalia is normal in appearance, no lesions.  The vagina is normal in appearance. Urethra has no lesions or masses. The cervix is bulbous. Pap with GC/CHL and HPV with 16/18 genotyping performed. Uterus is felt to be normal size, shape, and contour.  No adnexal masses or tenderness noted.Bladder is non tender, no masses felt. Extremities/musculoskeletal:  No swelling or varicosities noted, no clubbing or cyanosis Psych:  No mood changes, alert and cooperative,seems  happy Fall risk is low PHQ 2 score 0 Examination chaperoned by Levy Pupa LPN.   Impression: 1. Encounter for gynecological examination with Papanicolaou smear of cervix   2. Family planning   3. Screening examination for STD (sexually transmitted disease)   4. Irregular bleeding   5. Nexplanon in place   6. Encounter for initial prescription of contraceptive pills       Plan: Will rx lo loestrin,can start now or Sunday  Meds ordered this encounter  Medications  . Norethindrone-Ethinyl Estradiol-Fe Biphas (LO LOESTRIN FE) 1 MG-10 MCG / 10 MCG tablet    Sig: Take 1 tablet by mouth daily. Take 1 daily by mouth    Dispense:  1 Package    Refill:  11    BIN [id number], PCN [id number], GRP [id number]    Order Specific Question:   Supervising Provider    Answer:   Florian Buff [[id number]]  Return in 2 days for nexplanon removal with me Physical in 1 year Pap in 3 if normal Check HIV and RPR  Use condoms

## 2018-12-17 LAB — RPR: RPR Ser Ql: NONREACTIVE

## 2018-12-17 LAB — HIV ANTIBODY (ROUTINE TESTING W REFLEX): HIV Screen 4th Generation wRfx: NONREACTIVE

## 2018-12-18 ENCOUNTER — Ambulatory Visit (INDEPENDENT_AMBULATORY_CARE_PROVIDER_SITE_OTHER): Payer: Managed Care, Other (non HMO) | Admitting: Adult Health

## 2018-12-18 ENCOUNTER — Other Ambulatory Visit: Payer: Self-pay

## 2018-12-18 ENCOUNTER — Encounter: Payer: Self-pay | Admitting: Adult Health

## 2018-12-18 VITALS — BP 122/76 | HR 92 | Ht 63.0 in | Wt 198.0 lb

## 2018-12-18 DIAGNOSIS — Z3046 Encounter for surveillance of implantable subdermal contraceptive: Secondary | ICD-10-CM

## 2018-12-18 DIAGNOSIS — Z30017 Encounter for initial prescription of implantable subdermal contraceptive: Secondary | ICD-10-CM

## 2018-12-18 LAB — CYTOLOGY - PAP
Chlamydia: NEGATIVE
Diagnosis: NEGATIVE
HPV: NOT DETECTED
Neisseria Gonorrhea: NEGATIVE

## 2018-12-18 NOTE — Progress Notes (Signed)
Patient ID: Erica [name], female   DOB: 08-02-1995, 23 y.o.   MRN: [id number] History of Present Illness: Erica [name] is a 23 year old black female, in for nexplanon, had pap and physical family planning medicaid 12/16/18 and has Rx for OCs.HIV and RPR both negative. Pap is pending. PCP is Darla Lesches, NP.    Current Medications, Allergies, Past Medical History, Past Surgical History, Family History and Social History were reviewed in [employer] record.     Review of Systems: For nexplanon removal     Physical Exam:BP 122/76 (BP Location: Left Arm, Patient Position: Sitting, Cuff Size: Normal)   Pulse 92   Ht 5\' 3"  (1.6 m)   Wt 198 lb (89.8 kg)   LMP 12/18/2018   BMI 35.07 kg/m  General:  Well developed, well nourished, no acute distress Skin:  Warm and dry Psych:  No mood changes, alert and cooperative,seems happy Fall risk is low. Consent signed, time out called. Left arm cleansed with betadine, and injected with 2.5 cc 1% lidocaine and waited til numb.Under sterile technique a #11 blade was used to make small vertical incision, and a curved forceps was used to easily remove rod. Steri strips applied. Pressure dressing applied.   Impression: 1. Nexplanon removal   2. Nexplanon insertion       Plan: Start OCs on Sunday, has rx Use condoms x at least 1 pack, keep clean and dry x 24 hours, no heavy lifting, keep steri strips on x 72 hours, Keep pressure dressing on x 24 hours. Follow up prn problems.

## 2018-12-18 NOTE — Patient Instructions (Signed)
Use condoms for at least 1 pack, keep clean and dry x 24 hours, no heavy lifting, keep steri strips on x 72 hours, Keep pressure dressing on x 24 hours. Follow up prn problems. Start OCs Sunday

## 2019-02-13 ENCOUNTER — Telehealth: Payer: Self-pay | Admitting: *Deleted

## 2019-02-13 ENCOUNTER — Other Ambulatory Visit: Payer: Self-pay | Admitting: Women's Health

## 2019-02-13 MED ORDER — FLUCONAZOLE 150 MG PO TABS: 150.0000 mg | ORAL_TABLET | Freq: Once | ORAL | 0 refills | Status: AC

## 2019-02-13 NOTE — Telephone Encounter (Signed)
Patient states she has recently finished a course of antibiotics prescribed by her dentist and she now has a yeast infection. She is requesting Diflucan be sent to her pharmacy.

## 2019-03-03 ENCOUNTER — Telehealth: Payer: Self-pay | Admitting: *Deleted

## 2019-03-03 ENCOUNTER — Telehealth: Payer: Self-pay | Admitting: Obstetrics & Gynecology

## 2019-03-03 MED ORDER — METRONIDAZOLE 0.75 % VA GEL: VAGINAL | 0 refills | Status: DC

## 2019-03-03 NOTE — Telephone Encounter (Signed)
Pt thinks she has BV. Having thin discharge with odor. She is not sexually active. Wanted to see if we can send in RX.

## 2019-03-30 ENCOUNTER — Telehealth: Payer: Self-pay | Admitting: *Deleted

## 2019-03-30 MED ORDER — METRONIDAZOLE 500 MG PO TABS: 500.0000 mg | ORAL_TABLET | Freq: Two times a day (BID) | ORAL | 0 refills | Status: DC

## 2019-03-30 NOTE — Telephone Encounter (Signed)
Pt aware that I sent Rx for flagyl

## 2019-03-30 NOTE — Telephone Encounter (Signed)
Patient left message that she is having a vaginal odor and feels like she needs to have it checked. She isn't sure if its something we can do over the phone or if she has to come in. She would prefer not to come to the office due to concern over covid.

## 2019-03-30 NOTE — Telephone Encounter (Signed)
Patient states she has a white discharge with an odor.  Denies itching, burning. Dr Elonda Husky sent in [place] on 10/6 in which she used but states it gave her a yeast infection.  She has used the tablets in the past with relief with them.  She prefers not to come into the office if she doesn't need due to [place] but will if recommended. Please advise.

## 2019-05-06 ENCOUNTER — Telehealth: Payer: Self-pay | Admitting: *Deleted

## 2019-05-06 MED ORDER — NORETHIN ACE-ETH ESTRAD-FE 1-20 MG-MCG PO TABS: 1.0000 | ORAL_TABLET | Freq: Every day | ORAL | 11 refills | Status: DC

## 2019-05-06 NOTE — Telephone Encounter (Signed)
Patient called having period for 3 weeks. Feels like it's going to stop but doesn't. Patient would like something called in to make the bleeding stop.

## 2019-05-06 NOTE — Telephone Encounter (Signed)
Has been bleeding for 3 weeks on lo loestrin, so stop today, will rx junel 1/20 start Sunday and use condoms for 1 pack

## 2019-05-06 NOTE — Telephone Encounter (Signed)
Left message to call back  

## 2019-05-06 NOTE — Telephone Encounter (Signed)
Patient left message that she is calling Anderson Malta to let her know that she is on [employer]

## 2019-05-11 ENCOUNTER — Other Ambulatory Visit: Payer: Self-pay | Admitting: Adult Health

## 2019-05-11 MED ORDER — FLUCONAZOLE 150 MG PO TABS: ORAL_TABLET | ORAL | 1 refills | Status: DC

## 2019-05-11 NOTE — Progress Notes (Signed)
rx diflucan  

## 2019-06-24 ENCOUNTER — Other Ambulatory Visit: Payer: Self-pay | Admitting: Adult Health

## 2019-07-23 ENCOUNTER — Encounter: Payer: Self-pay | Admitting: Family Medicine

## 2019-07-23 ENCOUNTER — Telehealth (INDEPENDENT_AMBULATORY_CARE_PROVIDER_SITE_OTHER): Payer: Managed Care, Other (non HMO) | Admitting: Family Medicine

## 2019-07-23 ENCOUNTER — Other Ambulatory Visit: Payer: Self-pay | Admitting: Family Medicine

## 2019-07-23 ENCOUNTER — Other Ambulatory Visit: Payer: Self-pay

## 2019-07-23 DIAGNOSIS — L298 Other pruritus: Secondary | ICD-10-CM

## 2019-07-23 MED ORDER — FAMOTIDINE 20 MG PO TABS: 20.0000 mg | ORAL_TABLET | Freq: Two times a day (BID) | ORAL | 0 refills | Status: DC

## 2019-07-23 MED ORDER — PREDNISONE 10 MG (21) PO TBPK: ORAL_TABLET | ORAL | 0 refills | Status: DC

## 2019-07-23 MED ORDER — TRIAMCINOLONE ACETONIDE 0.1 % EX CREA: 1.0000 "application " | TOPICAL_CREAM | Freq: Two times a day (BID) | CUTANEOUS | 0 refills | Status: DC

## 2019-07-23 NOTE — Progress Notes (Signed)
Virtual Visit via MyChart Video Visit Note Due to COVID-19 pandemic this visit was conducted virtually. This visit type was conducted due to national recommendations for restrictions regarding the COVID-19 Pandemic (e.g. social distancing, sheltering in place) in an effort to limit this patient's exposure and mitigate transmission in our community. All issues noted in this document were discussed and addressed.  A physical exam was not performed with this format.   I connected with Erica [name] on 07/23/2019 at 1055 by video and verified that I am speaking with the correct person using two identifiers. Erica [name] is currently located at home and no one is currently with them during visit. The provider, Kari Baars, FNP is located in their office at time of visit.  I discussed the limitations, risks, security and privacy concerns of performing an evaluation and management service by telephone and the availability of in person appointments. I also discussed with the patient that there may be a patient responsible charge related to this service. The patient expressed understanding and agreed to proceed.  Subjective:  Patient ID: Erica [name], female    DOB: April 01, 1996, 24 y.o.   MRN: [id number]  Chief Complaint:  Rash   HPI: Erica [name] is a 24 y.o. female presenting on 07/23/2019 for Rash   Rash This is a new problem. The current episode started 1 to 4 weeks ago. The problem has been gradually worsening since onset. The affected locations include the abdomen, chest, back, left arm, right arm and neck. The rash is characterized by redness and itchiness. She was exposed to a new detergent/soap. Pertinent negatives include no anorexia, congestion, cough, diarrhea, eye pain, facial edema, fatigue, fever, joint pain, nail changes, rhinorrhea, shortness of breath, sore throat or vomiting. Past treatments include antihistamine, anti-itch cream, moisturizer and cold  compress. The treatment provided no relief.     Relevant past medical, surgical, family, and social history reviewed and updated as indicated.  Allergies and medications reviewed and updated.   Past Medical History:  Diagnosis Date  . Anemia   . Chlamydia 07/06/14  . Chlamydia infection 08/02/2014  . Chlamydia infection during pregnancy, antepartum 07/27/2014   Had +CHL 2/9 treated in hospital 2/19 will check POT 3/1  . Elevated BP 07/27/2014  . Ganglion cyst of wrist    left  . Pelvic pain in female 07/27/2014  . Pregnant 12/29/2014  . Screening for STD (sexually transmitted disease) 07/27/2014  . Sinus infection 12/29/2014  . Strep throat   . Vaginal discharge 12/29/2014  . Yeast infection 04/07/2013    Past Surgical History:  Procedure Laterality Date  . NO PAST SURGERIES      Social History   Socioeconomic History  . Marital status: Legally Separated    Spouse name: Not on file  . Number of children: Not on file  . Years of education: Not on file  . Highest education level: Not on file  Occupational History  . Not on file  Tobacco Use  . Smoking status: Never Smoker  . Smokeless tobacco: Never Used  Substance and Sexual Activity  . Alcohol use: No  . Drug use: No  . Sexual activity: Yes    Birth control/protection: Condom, Pill  Other Topics Concern  . Not on file  Social History Narrative  . Not on file   Social Determinants of Health   Financial Resource Strain:   . Difficulty of Paying Living Expenses: Not on file  Food Insecurity:   .  Worried About Programme researcher, broadcasting/film/video in the Last Year: Not on file  . Ran Out of Food in the Last Year: Not on file  Transportation Needs:   . Lack of Transportation (Medical): Not on file  . Lack of Transportation (Non-Medical): Not on file  Physical Activity:   . Days of Exercise per Week: Not on file  . Minutes of Exercise per Session: Not on file  Stress:   . Feeling of Stress : Not on file  Social Connections:   . Frequency  of Communication with Friends and Family: Not on file  . Frequency of Social Gatherings with Friends and Family: Not on file  . Attends Religious Services: Not on file  . Active Member of Clubs or Organizations: Not on file  . Attends Banker Meetings: Not on file  . Marital Status: Not on file  Intimate Partner Violence:   . Fear of Current or Ex-Partner: Not on file  . Emotionally Abused: Not on file  . Physically Abused: Not on file  . Sexually Abused: Not on file    Outpatient Encounter Medications as of 07/23/2019  Medication Sig  . famotidine (PEPCID) 20 MG tablet Take 1 tablet (20 mg total) by mouth 2 (two) times daily.  . fluconazole (DIFLUCAN) 150 MG tablet TAKE 1 TABLET BY MOUTH NOW. REPEAT 1 IN 3 DAYS  . metroNIDAZOLE (FLAGYL) 500 MG tablet Take 1 tablet (500 mg total) by mouth 2 (two) times daily.  . metroNIDAZOLE (METROGEL VAGINAL) 0.75 % vaginal gel Nightly x 5 nights  . norethindrone-ethinyl estradiol (LOESTRIN FE) 1-20 MG-MCG tablet Take 1 tablet by mouth daily.  . predniSONE (STERAPRED UNI-PAK 21 TAB) 10 MG (21) TBPK tablet As directed x 6 days  . triamcinolone cream (KENALOG) 0.1 % Apply 1 application topically 2 (two) times daily.   No facility-administered encounter medications on file as of 07/23/2019.    No Known Allergies  Review of Systems  Constitutional: Negative for activity change, appetite change, chills, diaphoresis, fatigue, fever and unexpected weight change.  HENT: Negative.  Negative for congestion, rhinorrhea, sore throat, trouble swallowing and voice change.   Eyes: Negative.  Negative for photophobia, pain and visual disturbance.  Respiratory: Negative for cough, choking, chest tightness, shortness of breath, wheezing and stridor.   Cardiovascular: Negative for chest pain, palpitations and leg swelling.  Gastrointestinal: Negative for abdominal pain, anorexia, blood in stool, constipation, diarrhea, nausea and vomiting.  Endocrine:  Negative.   Genitourinary: Negative for decreased urine volume, difficulty urinating, dysuria, frequency and urgency.  Musculoskeletal: Negative for arthralgias, joint pain and myalgias.  Skin: Positive for color change and rash. Negative for nail changes.  Allergic/Immunologic: Negative.   Neurological: Negative for dizziness, tremors, seizures, syncope, facial asymmetry, speech difficulty, weakness, light-headedness, numbness and headaches.  Hematological: Negative.   Psychiatric/Behavioral: Negative for confusion, hallucinations, sleep disturbance and suicidal ideas.  All other systems reviewed and are negative.        Observations/Objective: No vital signs or physical exam, this was a virtual health encounter.  Pt alert and oriented, answers all questions appropriately, and able to speak in full sentences.  No dyspnea, facial edema, or stridor. Red, raised rash to bilateral arms, chest, and neck noted. No drainage present.   Assessment and Plan: Latoyna was seen today for rash.  Diagnoses and all orders for this visit:  Pruritic erythematous rash Diffuse pruritic, red, raised rash likely due to new detergent. Pt has stopped using detergent but rash did not resolve.  Will treat with below. Symptomatic care discussed in detail. [t aware to report any new, worsening, or persistent symptoms.  -     famotidine (PEPCID) 20 MG tablet; Take 1 tablet (20 mg total) by mouth 2 (two) times daily. -     triamcinolone cream (KENALOG) 0.1 %; Apply 1 application topically 2 (two) times daily. -     predniSONE (STERAPRED UNI-PAK 21 TAB) 10 MG (21) TBPK tablet; As directed x 6 days     Follow Up Instructions: Return if symptoms worsen or fail to improve.    I discussed the assessment and treatment plan with the patient. The patient was provided an opportunity to ask questions and all were answered. The patient agreed with the plan and demonstrated an understanding of the instructions.   The  patient was advised to call back or seek an in-person evaluation if the symptoms worsen or if the condition fails to improve as anticipated.  The above assessment and management plan was discussed with the patient. The patient verbalized understanding of and has agreed to the management plan. Patient is aware to call the clinic if they develop any new symptoms or if symptoms persist or worsen. Patient is aware when to return to the clinic for a follow-up visit. Patient educated on when it is appropriate to go to the emergency department.    I provided 15 minutes of non-face-to-face time during this encounter. The video started at 1055. The video ended at 1105. The other time was used for coordination of care.    Monia Pouch, FNP-C [facility] [address] [place], [place] [address] [phone] 07/23/2019

## 2019-08-19 ENCOUNTER — Other Ambulatory Visit: Payer: Self-pay | Admitting: Family Medicine

## 2019-08-19 DIAGNOSIS — L298 Other pruritus: Secondary | ICD-10-CM

## 2019-08-27 ENCOUNTER — Other Ambulatory Visit: Payer: Self-pay | Admitting: Adult Health

## 2019-08-27 MED ORDER — FLUCONAZOLE 150 MG PO TABS: ORAL_TABLET | ORAL | 1 refills | Status: DC

## 2019-08-27 NOTE — Progress Notes (Signed)
rx diflucan  

## 2019-09-19 ENCOUNTER — Ambulatory Visit: Payer: Managed Care, Other (non HMO) | Attending: Internal Medicine

## 2019-09-19 DIAGNOSIS — Z23 Encounter for immunization: Secondary | ICD-10-CM

## 2019-09-19 NOTE — Progress Notes (Signed)
   Covid-19 Vaccination Clinic  Name:  Erica [name]    MRN: [id number] DOB: 02/11/1996  09/19/2019  [name] was observed post Covid-19 immunization for 15 minutes without incident. She was provided with Vaccine Information Sheet and instruction to access the V-Safe system.   [name] was instructed to call 911 with any severe reactions post vaccine: Marland Kitchen Difficulty breathing  . Swelling of face and throat  . A fast heartbeat  . A bad rash all over body  . Dizziness and weakness   Immunizations Administered    Name Date Dose VIS Date Route   Pfizer COVID-19 Vaccine 09/19/2019  8:16 AM 0.3 mL 07/22/2018 Intramuscular   Manufacturer: [employer], [employer]   Lot: [id number]   NDC: [address]-7

## 2019-09-25 ENCOUNTER — Encounter: Payer: Self-pay | Admitting: Adult Health

## 2019-09-25 ENCOUNTER — Other Ambulatory Visit: Payer: Self-pay

## 2019-09-25 ENCOUNTER — Ambulatory Visit (INDEPENDENT_AMBULATORY_CARE_PROVIDER_SITE_OTHER): Payer: Managed Care, Other (non HMO) | Admitting: Adult Health

## 2019-09-25 VITALS — BP 124/81 | HR 91 | Ht 63.0 in | Wt 194.0 lb

## 2019-09-25 DIAGNOSIS — B379 Candidiasis, unspecified: Secondary | ICD-10-CM

## 2019-09-25 DIAGNOSIS — N898 Other specified noninflammatory disorders of vagina: Secondary | ICD-10-CM

## 2019-09-25 DIAGNOSIS — Z113 Encounter for screening for infections with a predominantly sexual mode of transmission: Secondary | ICD-10-CM

## 2019-09-25 LAB — POCT WET PREP (WET MOUNT)

## 2019-09-25 MED ORDER — FLUCONAZOLE 150 MG PO TABS: ORAL_TABLET | ORAL | 1 refills | Status: DC

## 2019-09-25 NOTE — Progress Notes (Signed)
  Subjective:     Patient ID: Erica [name], female   DOB: 03-01-1996, 24 y.o.   MRN: [id number]  HPI Erica [name] is a 24 year old black female, [phone], in complaining of white clumpy discharge, is on antibiotics for tooth infection.Is better after taking 1 diflucan. She requests STD testing too.  PCP is Gilford Silvius NP  Review of Systems Has clumpy white vaginal discharge  Reviewed past medical,surgical, social and family history. Reviewed medications and allergies.     Objective:   Physical Exam BP 124/81 (BP Location: Right Arm, Patient Position: Sitting, Cuff Size: Normal)   Pulse 91   Ht 5\' 3"  (1.6 m)   Wt 194 lb (88 kg)   BMI 34.37 kg/m    Skin warm and dry.Pelvic: external genitalia is normal in appearance no lesions, vagina: white discharge without odor,side walls are red,urethra has no lesions or masses noted, cervix:smooth and bulbous, uterus: normal size, shape and contour, non tender, no masses felt, adnexa: no masses or tenderness noted. Bladder is non tender and no masses felt. Wet prep: + yeast and +WBCs. Nuswab obtained. Examination chaperoned by LPN Assessment:     1. Screening examination for STD (sexually transmitted disease) Check HIV and RPR nuswab sent   2. Vaginal discharge nuswab  sent   3. Yeast infection Will  rx diflucan Meds ordered this encounter  Medications  . fluconazole (DIFLUCAN) 150 MG tablet    Sig: TAKE 1 TABLET BY MOUTH NOW. REPEAT 1 IN 3 DAYS    Dispense:  2 tablet    Refill:  1    Order Specific Question:   Supervising Provider    Answer:   Faith Rogue [[id number]]      Plan:     Follow up prn

## 2019-09-26 LAB — RPR: RPR Ser Ql: NONREACTIVE

## 2019-09-26 LAB — HIV ANTIBODY (ROUTINE TESTING W REFLEX): HIV Screen 4th Generation wRfx: NONREACTIVE

## 2019-10-03 LAB — NUSWAB VAGINITIS PLUS (VG+)
Candida albicans, NAA: POSITIVE — AB
Candida glabrata, NAA: NEGATIVE
Chlamydia trachomatis, NAA: NEGATIVE
Neisseria gonorrhoeae, NAA: NEGATIVE
Trich vag by NAA: NEGATIVE

## 2019-10-12 ENCOUNTER — Ambulatory Visit: Payer: Managed Care, Other (non HMO) | Attending: Internal Medicine

## 2019-10-12 DIAGNOSIS — Z23 Encounter for immunization: Secondary | ICD-10-CM

## 2019-10-12 NOTE — Progress Notes (Signed)
   Covid-19 Vaccination Clinic  Name:  Erica [name]    MRN: [id number] DOB: 01-02-1996  10/12/2019  Erica [name] was observed post Covid-19 immunization for 15 minutes without incident. She was provided with Vaccine Information Sheet and instruction to access the V-Safe system.   Erica [name] was instructed to call 911 with any severe reactions post vaccine: Marland Kitchen Difficulty breathing  . Swelling of face and throat  . A fast heartbeat  . A bad rash all over body  . Dizziness and weakness   Immunizations Administered    Name Date Dose VIS Date Route   Pfizer COVID-19 Vaccine 10/12/2019  8:33 AM 0.3 mL 07/22/2018 Intramuscular   Manufacturer: [employer], [employer]   Lot: [id number]   NDC: [address]-8

## 2019-11-11 ENCOUNTER — Other Ambulatory Visit: Payer: Self-pay | Admitting: Adult Health

## 2019-11-11 MED ORDER — FLUCONAZOLE 150 MG PO TABS: ORAL_TABLET | ORAL | 1 refills | Status: DC

## 2019-11-11 NOTE — Progress Notes (Signed)
Refill diflucan 

## 2020-02-02 ENCOUNTER — Telehealth: Payer: Self-pay | Admitting: *Deleted

## 2020-02-02 MED ORDER — SULFAMETHOXAZOLE-TRIMETHOPRIM 800-160 MG PO TABS
1.0000 | ORAL_TABLET | Freq: Two times a day (BID) | ORAL | 0 refills | Status: DC
Start: 2020-02-02 — End: 2020-02-11

## 2020-02-02 NOTE — Telephone Encounter (Signed)
Pt is going to get Covid tested hopefully today. 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 Pt has noticed urinary frequency and nothing comes out. Urine is really dark in color, has an odor and looks "foggy". Can you send in med for urine? Thanks!! JSY

## 2020-02-02 NOTE — Telephone Encounter (Signed)
Erica [name] has UTI symptoms and is getting COVID testing at [facility] 02/04/20.told to look at [facility] website too for other locations  Will rx septra ds and push fluids

## 2020-02-06 ENCOUNTER — Other Ambulatory Visit: Payer: Self-pay | Admitting: Nurse Practitioner

## 2020-02-06 MED ORDER — MUPIROCIN 2 % EX OINT: TOPICAL_OINTMENT | CUTANEOUS | 0 refills | Status: DC

## 2020-02-11 ENCOUNTER — Other Ambulatory Visit (INDEPENDENT_AMBULATORY_CARE_PROVIDER_SITE_OTHER): Payer: Managed Care, Other (non HMO) | Admitting: *Deleted

## 2020-02-11 ENCOUNTER — Other Ambulatory Visit: Payer: Self-pay

## 2020-02-11 ENCOUNTER — Other Ambulatory Visit (HOSPITAL_COMMUNITY)
Admission: RE | Admit: 2020-02-11 | Discharge: 2020-02-11 | Disposition: A | Discharge status: Home / Self Care | Payer: Managed Care, Other (non HMO) | Source: Ambulatory Visit | Attending: Obstetrics & Gynecology | Admitting: Obstetrics & Gynecology

## 2020-02-11 DIAGNOSIS — R8290 Unspecified abnormal findings in urine: Secondary | ICD-10-CM | POA: Diagnosis not present

## 2020-02-11 DIAGNOSIS — Z113 Encounter for screening for infections with a predominantly sexual mode of transmission: Secondary | ICD-10-CM

## 2020-02-11 DIAGNOSIS — R3915 Urgency of urination: Secondary | ICD-10-CM

## 2020-02-11 DIAGNOSIS — N898 Other specified noninflammatory disorders of vagina: Secondary | ICD-10-CM | POA: Diagnosis not present

## 2020-02-11 LAB — POCT URINALYSIS DIPSTICK
Blood, UA: NEGATIVE
Glucose, UA: NEGATIVE
Ketones, UA: NEGATIVE
Leukocytes, UA: NEGATIVE
Nitrite, UA: NEGATIVE
Protein, UA: NEGATIVE

## 2020-02-11 NOTE — Progress Notes (Signed)
   NURSE VISIT- UTI SYMPTOMS   SUBJECTIVE:  Erica [name] is a 24 y.o. [phone] female here for UTI symptoms. She is a GYN patient. She reports urinary urgency and odor with urine.  OBJECTIVE:  There were no vitals taken for this visit.  Appears well, in no apparent distress  Results for orders placed or performed in visit on 02/11/20 (from the past 24 hour(s))  POCT Urinalysis Dipstick   Collection Time: 02/11/20  9:42 AM  Result Value Ref Range   Color, UA     Clarity, UA     Glucose, UA Negative Negative   Bilirubin, UA     Ketones, UA neg    Spec Grav, UA     Blood, UA neg    pH, UA     Protein, UA Negative Negative   Urobilinogen, UA     Nitrite, UA neg    Leukocytes, UA Negative Negative   Appearance     Odor      ASSESSMENT: GYN patient with UTI symptoms and negative nitrites  PLAN: Note routed to Cyril Mourning, AGNP   Rx sent by provider today: No Urine culture sent Call or return to clinic prn if these symptoms worsen or fail to improve as anticipated. Follow-up: as needed   Malachy Mood  02/11/2020 9:49 AM

## 2020-02-11 NOTE — Progress Notes (Signed)
Chart reviewed for nurse visit. Agree with plan of care.  Adline Potter, NP 02/11/2020 11:45 AM

## 2020-02-12 ENCOUNTER — Other Ambulatory Visit: Payer: Self-pay | Admitting: Adult Health

## 2020-02-12 ENCOUNTER — Encounter: Payer: Self-pay | Admitting: Family Medicine

## 2020-02-12 ENCOUNTER — Encounter: Payer: Managed Care, Other (non HMO) | Admitting: Family Medicine

## 2020-02-12 LAB — CERVICOVAGINAL ANCILLARY ONLY
Bacterial Vaginitis (gardnerella): POSITIVE — AB
Candida Glabrata: NEGATIVE
Candida Vaginitis: NEGATIVE
Chlamydia: NEGATIVE
Comment: NEGATIVE
Comment: NEGATIVE
Comment: NEGATIVE
Comment: NEGATIVE
Comment: NEGATIVE
Comment: NORMAL
Neisseria Gonorrhea: NEGATIVE
Trichomonas: NEGATIVE

## 2020-02-12 LAB — MICROSCOPIC EXAMINATION
Bacteria, UA: NONE SEEN
Casts: NONE SEEN /lpf

## 2020-02-12 LAB — URINALYSIS, ROUTINE W REFLEX MICROSCOPIC
Bilirubin, UA: NEGATIVE
Glucose, UA: NEGATIVE
Ketones, UA: NEGATIVE
Nitrite, UA: NEGATIVE
Protein,UA: NEGATIVE
RBC, UA: NEGATIVE
Specific Gravity, UA: 1.017 (ref 1.005–1.030)
Urobilinogen, Ur: 0.2 mg/dL (ref 0.2–1.0)
pH, UA: 6.5 (ref 5.0–7.5)

## 2020-02-12 LAB — HIV ANTIBODY (ROUTINE TESTING W REFLEX): HIV Screen 4th Generation wRfx: NONREACTIVE

## 2020-02-12 LAB — RPR: RPR Ser Ql: NONREACTIVE

## 2020-02-12 MED ORDER — METRONIDAZOLE 500 MG PO TABS: 500.0000 mg | ORAL_TABLET | Freq: Two times a day (BID) | ORAL | 0 refills | Status: DC

## 2020-02-12 NOTE — Progress Notes (Signed)
CV swab +BV rx flagyl °

## 2020-02-13 LAB — URINE CULTURE: Organism ID, Bacteria: NO GROWTH

## 2020-02-14 NOTE — Progress Notes (Signed)
Patient reports she cancelled appointment on MyChart. No longer needed.

## 2020-04-19 ENCOUNTER — Other Ambulatory Visit: Payer: Self-pay | Admitting: Adult Health

## 2020-04-19 MED ORDER — FLUCONAZOLE 150 MG PO TABS: ORAL_TABLET | ORAL | 1 refills | Status: DC

## 2020-04-19 NOTE — Progress Notes (Signed)
rx diflucan  

## 2020-04-25 ENCOUNTER — Other Ambulatory Visit: Payer: Self-pay | Admitting: Adult Health

## 2020-07-30 ENCOUNTER — Other Ambulatory Visit: Payer: Self-pay | Admitting: Adult Health

## 2020-08-05 ENCOUNTER — Other Ambulatory Visit: Payer: Self-pay

## 2020-08-05 ENCOUNTER — Ambulatory Visit (INDEPENDENT_AMBULATORY_CARE_PROVIDER_SITE_OTHER): Payer: Managed Care, Other (non HMO) | Admitting: Family Medicine

## 2020-08-05 ENCOUNTER — Encounter: Payer: Self-pay | Admitting: Family Medicine

## 2020-08-05 VITALS — BP 116/85 | HR 92 | Temp 98.5°F | Ht 63.0 in | Wt 174.5 lb

## 2020-08-05 DIAGNOSIS — Z0001 Encounter for general adult medical examination with abnormal findings: Secondary | ICD-10-CM

## 2020-08-05 DIAGNOSIS — Z113 Encounter for screening for infections with a predominantly sexual mode of transmission: Secondary | ICD-10-CM | POA: Diagnosis not present

## 2020-08-05 DIAGNOSIS — N926 Irregular menstruation, unspecified: Secondary | ICD-10-CM

## 2020-08-05 DIAGNOSIS — Z0000 Encounter for general adult medical examination without abnormal findings: Secondary | ICD-10-CM

## 2020-08-05 LAB — PREGNANCY, URINE: Preg Test, Ur: NEGATIVE

## 2020-08-05 NOTE — Progress Notes (Signed)
Erica [name] is a 25 y.o. female presents to office today for annual physical exam examination.    Concerns today include: 1. Irregular cycles Erica [name] reports that her cycles are a week behind her placebo week on her OCPs. This has happened before and her Ob/Gyn changed her birth control. She is nervous about pregnancy she she became pregnant with her son on birth control.  2. STD testing Erica [name] would like urine cytology and blood work for STD testing. She denies known exposure.   Last pap smear: 12/16/18- normal Refills needed today: none  Past Medical History:  Diagnosis Date  . Anemia   . Chlamydia 07/06/14  . Chlamydia infection 08/02/2014  . Chlamydia infection during pregnancy, antepartum 07/27/2014   Had +CHL 2/9 treated in hospital 2/19 will check POT 3/1  . Elevated BP 07/27/2014  . Ganglion cyst of wrist    left  . Pelvic pain in female 07/27/2014  . Pregnant 12/29/2014  . Screening for STD (sexually transmitted disease) 07/27/2014  . Sinus infection 12/29/2014  . Strep throat   . Vaginal discharge 12/29/2014  . Yeast infection 04/07/2013   Social History   Socioeconomic History  . Marital status: Legally Separated    Spouse name: Not on file  . Number of children: Not on file  . Years of education: Not on file  . Highest education level: Not on file  Occupational History  . Not on file  Tobacco Use  . Smoking status: Never Smoker  . Smokeless tobacco: Never Used  Vaping Use  . Vaping Use: Never used  Substance and Sexual Activity  . Alcohol use: No  . Drug use: No  . Sexual activity: Yes    Birth control/protection: Condom, Pill  Other Topics Concern  . Not on file  Social History Narrative  . Not on file   Social Determinants of Health   Financial Resource Strain: Not on file  Food Insecurity: Not on file  Transportation Needs: Not on file  Physical Activity: Not on file  Stress: Not on file  Social Connections: Not on file  Intimate Partner  Violence: Not on file   Past Surgical History:  Procedure Laterality Date  . NO PAST SURGERIES     Family History  Problem Relation Age of Onset  . Diabetes Maternal Grandmother   . Diabetes Paternal Grandmother   . Multiple sclerosis Cousin     Current Outpatient Medications:  .  AUROVELA FE 1/20 1-20 MG-MCG tablet, TAKE 1 TABLET BY MOUTH DAILY, Disp: 28 tablet, Rfl: 12  No Known Allergies   ROS: Review of Systems Pertinent items noted in HPI and remainder of comprehensive ROS otherwise negative.    Physical exam BP 116/85   Pulse 92   Temp 98.5 F (36.9 C) (Temporal)   Ht '5\' 3"'  (1.6 m)   Wt 174 lb 8 oz (79.2 kg)   BMI 30.91 kg/m  General appearance: alert, cooperative and no distress Head: Normocephalic, without obvious abnormality, atraumatic Eyes: conjunctivae/corneas clear. PERRL, EOM's intact. Fundi benign. Ears: normal TM's and external ear canals both ears Nose: Nares normal. Septum midline. Mucosa normal. No drainage or sinus tenderness. Throat: lips, mucosa, and tongue normal; teeth and gums normal Neck: no adenopathy, no carotid bruit, no JVD, supple, symmetrical, trachea midline and thyroid not enlarged, symmetric, no tenderness/mass/nodules Lungs: clear to auscultation bilaterally Breasts: normal appearance, no masses or tenderness, No nipple retraction or dimpling, No nipple discharge or bleeding, No axillary or supraclavicular adenopathy, Normal  to palpation without dominant masses, Taught monthly breast self examination Heart: regular rate and rhythm, S1, S2 normal, no murmur, click, rub or gallop Abdomen: soft, non-tender; bowel sounds normal; no masses,  no organomegaly Extremities: extremities normal, atraumatic, no cyanosis or edema Pulses: 2+ and symmetric Skin: Skin color, texture, turgor normal. No rashes or lesions Lymph nodes: Cervical, supraclavicular, and axillary nodes normal. Neurologic: Alert and oriented X 3, normal strength and tone.  Normal symmetric reflexes. Normal coordination and gait    Assessment/ Plan: Erica [name] here for annual physical exam.   Erica [name] was seen today for annual exam.  Diagnoses and all orders for this visit:  Routine general medical examination at a health care facility Labs pending as below.  -     CBC with Differential/Platelet -     CMP14+EGFR -     Lipid panel -     Thyroid Panel With TSH  Irregular menstrual cycle Urine pregnancy obtained. Patient will discuss with Ob/Gyn. -     Pregnancy, urine  Screening for STDs (sexually transmitted diseases) Screening as below.  -     STD Screen (8) -     GC/Chlamydia Probe Amp  Counseled on healthy lifestyle choices, including diet (rich in fruits, vegetables and lean meats and low in salt and simple carbohydrates) and exercise (at least 30 minutes of moderate physical activity daily).  Patient to follow up in 1 year for annual exam or sooner if needed.  The above assessment and management plan was discussed with the patient. The patient verbalized understanding of and has agreed to the management plan. Patient is aware to call the clinic if symptoms persist or worsen. Patient is aware when to return to the clinic for a follow-up visit. Patient educated on when it is appropriate to go to the emergency department.   Marjorie Smolder, FNP-C [facility] [address] [place], [place] [address] [phone]

## 2020-08-05 NOTE — Patient Instructions (Addendum)
Breast Self-Awareness Breast self-awareness means being familiar with how your breasts look and feel. It involves checking your breasts regularly and reporting any changes to your health care provider. Practicing breast self-awareness is important. Sometimes changes may not be harmful (are benign), but sometimes a change in your breasts can be a sign of a serious medical problem. It is important to learn how to do this procedure correctly so that you can catch problems early, when treatment is more likely to be successful. All women should practice breast self-awareness, including women who have had breast implants. What you need:  A mirror.  A well-lit room. How to do a breast self-exam A breast self-exam is one way to learn what is normal for your breasts and whether your breasts are changing. To do a breast self-exam: Look for changes 1. Remove all the clothing above your waist. 2. Stand in front of a mirror in a room with good lighting. 3. Put your hands on your hips. 4. Push your hands firmly downward. 5. Compare your breasts in the mirror. Look for differences between them (asymmetry), such as: ? Differences in shape. ? Differences in size. ? Puckers, dips, and bumps in one breast and not the other. 6. Look at each breast for changes in the skin, such as: ? Redness. ? Scaly areas. 7. Look for changes in your nipples, such as: ? Discharge. ? Bleeding. ? Dimpling. ? Redness. ? A change in position.   Feel for changes Carefully feel your breasts for lumps and changes. It is best to do this while lying on your back on the floor, and again while sitting or standing in the tub or shower with soapy water on your skin. Feel each breast in the following way: 1. Place the arm on the side of the breast you are examining above your head. 2. Feel your breast with the other hand. 3. Start in the nipple area and make -inch (2 cm) overlapping circles to feel your breast. Use the pads of your  three middle fingers to do this. Apply light pressure, then medium pressure, then firm pressure. The light pressure will allow you to feel the tissue closest to the skin. The medium pressure will allow you to feel the tissue that is a little deeper. The firm pressure will allow you to feel the tissue close to the ribs. 4. Continue the overlapping circles, moving downward over the breast until you feel your ribs below your breast. 5. Move one finger-width toward the center of the body. Continue to use the -inch (2 cm) overlapping circles to feel your breast as you move slowly up toward your collarbone. 6. Continue the up-and-down exam using all three pressures until you reach your armpit.   Write down what you find Writing down what you find can help you remember what to discuss with your health care provider. Write down:  What is normal for each breast.  Any changes that you find in each breast, including: ? The kind of changes you find. ? Any pain or tenderness. ? Size and location of any lumps.  Where you are in your menstrual cycle, if you are still menstruating. General tips and recommendations  Examine your breasts every month.  If you are breastfeeding, the best time to examine your breasts is after a feeding or after using a breast pump.  If you menstruate, the best time to examine your breasts is 5-7 days after your period. Breasts are generally lumpier during menstrual periods, and  it may be more difficult to notice changes.  With time and practice, you will become more familiar with the variations in your breasts and more comfortable with the exam. Contact a health care provider if you:  See a change in the shape or size of your breasts or nipples.  See a change in the skin of your breast or nipples, such as a reddened or scaly area.  Have unusual discharge from your nipples.  Find a lump or thick area that was not there before.  Have pain in your breasts.  Have any  concerns related to your breast health. Summary  Breast self-awareness includes looking for physical changes in your breasts, as well as feeling for any changes within your breasts.  Breast self-awareness should be performed in front of a mirror in a well-lit room.  You should examine your breasts every month. If you menstruate, the best time to examine your breasts is 5-7 days after your menstrual period.  Let your health care provider know of any changes you notice in your breasts, including changes in size, changes on the skin, pain or tenderness, or unusual fluid from your nipples. This information is not intended to replace advice given to you by your health care provider. Make sure you discuss any questions you have with your health care provider. Document Revised: 12/31/2017 Document Reviewed: 12/31/2017 Elsevier Patient Education  2021 [employer] ral instructions  Schedule regular health, dental, and eye exams.  Stay current with your vaccines.  Tell your health care provider if: ? You often feel depressed. ? You have ever been abused or do not feel safe at home. Summary  Adopting a healthy lifestyle and getting preventive care are important in promoting health and wellness.  Follow your health care provider's instructions about healthy diet, exercising, and getting tested or screened for diseases.  Follow your health care provider's instructions on monitoring your cholesterol and blood pressure. This information is not intended to replace advice given to you by your health care provider. Make sure you discuss any questions you have with your health care provider. Document Revised: 05/07/2018 Document Reviewed: 05/07/2018 Elsevier Patient Education  2021 [employer] Inc[employer]    Health Maintenance, Female Adopting a healthy lifestyle and getting preventive care are important in promoting health and wellness. Ask your health care provider about:  The right schedule for you  to have regular tests and exams.  Things you can do on your own to prevent diseases and keep yourself healthy. What should I know about diet, weight, and exercise? Eat a healthy diet  Eat a diet that includes plenty of vegetables, fruits, low-fat dairy products, and lean protein.  Do not eat a lot of foods that are high in solid fats, added sugars, or sodium.   Maintain a healthy weight Body mass index (BMI) is used to identify weight problems. It estimates body fat based on height and weight. Your health care provider can help determine your BMI and help you achieve or maintain a healthy weight. Get regular exercise Get regular exercise. This is one of the most important things you can do for your health. Most adults should:  Exercise for at least 150 minutes each week. The exercise should increase your heart rate and make you sweat (moderate-intensity exercise).  Do strengthening exercises at least twice a week. This is in addition to the moderate-intensity exercise.  Spend less time sitting. Even light physical activity can be beneficial. Watch cholesterol and blood lipids Have your blood  tested for lipids and cholesterol at 27 years of age, then have this test every 5 years. Have your cholesterol levels checked more often if:  Your lipid or cholesterol levels are high.  You are older than 27 years of age.  You are at high risk for heart disease. What should I know about cancer screening? Depending on your health history and family history, you may need to have cancer screening at various ages. This may include screening for:  Breast cancer.  Cervical cancer.  Colorectal cancer.  Skin cancer.  Lung cancer. What should I know about heart disease, diabetes, and high blood pressure? Blood pressure and heart disease  High blood pressure causes heart disease and increases the risk of stroke. This is more likely to develop in people who have high blood pressure readings, are  of African descent, or are overweight.  Have your blood pressure checked: ? Every 3-5 years if you are 66-25 years of age. ? Every year if you are 31 years old or older. Diabetes Have regular diabetes screenings. This checks your fasting blood sugar level. Have the screening done:  Once every three years after age 46 if you are at a normal weight and have a low risk for diabetes.  More often and at a younger age if you are overweight or have a high risk for diabetes. What should I know about preventing infection? Hepatitis B If you have a higher risk for hepatitis B, you should be screened for this virus. Talk with your health care provider to find out if you are at risk for hepatitis B infection. Hepatitis C Testing is recommended for:  Everyone born from [birth year] through 1967.  Anyone with known risk factors for hepatitis C. Sexually transmitted infections (STIs)  Get screened for STIs, including gonorrhea and chlamydia, if: ? You are sexually active and are younger than 27 years of age. ? You are older than 27 years of age and your health care provider tells you that you are at risk for this type of infection. ? Your sexual activity has changed since you were last screened, and you are at increased risk for chlamydia or gonorrhea. Ask your health care provider if you are at risk.  Ask your health care provider about whether you are at high risk for HIV. Your health care provider may recommend a prescription medicine to help prevent HIV infection. If you choose to take medicine to prevent HIV, you should first get tested for HIV. You should then be tested every 3 months for as long as you are taking the medicine. Pregnancy  If you are about to stop having your period (premenopausal) and you may become pregnant, seek counseling before you get pregnant.  Take 400 to 800 micrograms (mcg) of folic acid every day if you become pregnant.  Ask for birth control (contraception) if you want to  prevent pregnancy. Osteoporosis and menopause Osteoporosis is a disease in which the bones lose minerals and strength with aging. This can result in bone fractures. If you are 32 years old or older, or if you are at risk for osteoporosis and fractures, ask your health care provider if you should:  Be screened for bone loss.  Take a calcium or vitamin D supplement to lower your risk of fractures.  Be given hormone replacement therapy (HRT) to treat symptoms of menopause. Follow these instructions at home: Lifestyle  Do not use any products that contain nicotine or tobacco, such as cigarettes, e-cigarettes, and chewing  tobacco. If you need help quitting, ask your health care provider.  Do not use street drugs.  Do not share needles.  Ask your health care provider for help if you need support or information about quitting drugs. Alcohol use  Do not drink alcohol if: ? Your health care provider tells you not to drink. ? You are pregnant, may be pregnant, or are planning to become pregnant.  If you drink alcohol: ? Limit how much you use to 0-1 drink a day. ? Limit intake if you are breastfeeding.  Be aware of how much alcohol is in your drink. In the [place] one drink equals one 12 oz bottle of beer (355 mL), one 5 oz glass of wine (148 mL), or one 1 oz glass of hard liquor (44 mL).

## 2020-08-06 LAB — CMP14+EGFR
ALT: 13 IU/L (ref 0–32)
AST: 16 IU/L (ref 0–40)
Albumin/Globulin Ratio: 1.4 (ref 1.2–2.2)
Albumin: 4.5 g/dL (ref 3.9–5.0)
Alkaline Phosphatase: 59 IU/L (ref 44–121)
BUN/Creatinine Ratio: 12 (ref 9–23)
BUN: 11 mg/dL (ref 6–20)
Bilirubin Total: 0.3 mg/dL (ref 0.0–1.2)
CO2: 20 mmol/L (ref 20–29)
Calcium: 9.7 mg/dL (ref 8.7–10.2)
Chloride: 101 mmol/L (ref 96–106)
Creatinine, Ser: 0.91 mg/dL (ref 0.57–1.00)
Globulin, Total: 3.3 g/dL (ref 1.5–4.5)
Glucose: 84 mg/dL (ref 65–99)
Potassium: 4.1 mmol/L (ref 3.5–5.2)
Sodium: 138 mmol/L (ref 134–144)
Total Protein: 7.8 g/dL (ref 6.0–8.5)
eGFR: 89 mL/min/{1.73_m2} (ref >59)

## 2020-08-06 LAB — CBC WITH DIFFERENTIAL/PLATELET
Basophils Absolute: 0 10*3/uL (ref 0.0–0.2)
Basos: 0 %
EOS (ABSOLUTE): 0.1 10*3/uL (ref 0.0–0.4)
Eos: 1 %
Hematocrit: 44.3 % (ref 34.0–46.6)
Hemoglobin: 14.6 g/dL (ref 11.1–15.9)
Immature Grans (Abs): 0 10*3/uL (ref 0.0–0.1)
Immature Granulocytes: 0 %
Lymphocytes Absolute: 2.1 10*3/uL (ref 0.7–3.1)
Lymphs: 20 %
MCH: 28 pg (ref 26.6–33.0)
MCHC: 33 g/dL (ref 31.5–35.7)
MCV: 85 fL (ref 79–97)
Monocytes Absolute: 0.8 10*3/uL (ref 0.1–0.9)
Monocytes: 7 %
Neutrophils Absolute: 7.5 10*3/uL — ABNORMAL HIGH (ref 1.4–7.0)
Neutrophils: 72 %
Platelets: 327 10*3/uL (ref 150–450)
RBC: 5.21 x10E6/uL (ref 3.77–5.28)
RDW: 12.6 % (ref 11.7–15.4)
WBC: 10.5 10*3/uL (ref 3.4–10.8)

## 2020-08-06 LAB — THYROID PANEL WITH TSH
Free Thyroxine Index: 2.7 (ref 1.2–4.9)
T3 Uptake Ratio: 24 % (ref 24–39)
T4, Total: 11.3 ug/dL (ref 4.5–12.0)
TSH: 2.41 u[IU]/mL (ref 0.450–4.500)

## 2020-08-06 LAB — STD SCREEN (8)
HIV Screen 4th Generation wRfx: NONREACTIVE
HSV 1 Glycoprotein G Ab, IgG: 30.6 index — ABNORMAL HIGH (ref 0.00–0.90)
HSV 2 IgG, Type Spec: <0.91 index (ref 0.00–0.90)
Hep A IgM: NEGATIVE
Hep B C IgM: NEGATIVE
Hep C Virus Ab: <0.1 s/co ratio (ref 0.0–0.9)
Hepatitis B Surface Ag: NEGATIVE
RPR Ser Ql: REACTIVE — AB

## 2020-08-06 LAB — RPR, QUANT. (REFLEX): Rapid Plasma Reagin, Quant: 1:2 {titer} — ABNORMAL HIGH

## 2020-08-06 LAB — LIPID PANEL
Chol/HDL Ratio: 3.6 ratio (ref 0.0–4.4)
Cholesterol, Total: 217 mg/dL — ABNORMAL HIGH (ref 100–199)
HDL: 61 mg/dL (ref >39)
LDL Chol Calc (NIH): 137 mg/dL — ABNORMAL HIGH (ref 0–99)
Triglycerides: 105 mg/dL (ref 0–149)
VLDL Cholesterol Cal: 19 mg/dL (ref 5–40)

## 2020-08-06 LAB — GC/CHLAMYDIA PROBE AMP
Chlamydia trachomatis, NAA: NEGATIVE
Neisseria Gonorrhoeae by PCR: NEGATIVE

## 2020-08-08 ENCOUNTER — Encounter: Payer: Self-pay | Admitting: Family Medicine

## 2020-08-08 NOTE — Telephone Encounter (Signed)
The tests were all normal. The one exception was the RPR (syphilis) test. It came back equivocal, so a second level test will be done. Reassure Erica [name] that this is common, and usually comes back okay.  Please add FTA to her lab.  Thanks, WS

## 2020-08-09 ENCOUNTER — Other Ambulatory Visit: Payer: Self-pay | Admitting: Family Medicine

## 2020-08-09 ENCOUNTER — Ambulatory Visit (INDEPENDENT_AMBULATORY_CARE_PROVIDER_SITE_OTHER): Payer: Managed Care, Other (non HMO)

## 2020-08-09 ENCOUNTER — Other Ambulatory Visit: Payer: Self-pay

## 2020-08-09 ENCOUNTER — Encounter: Payer: Self-pay | Admitting: Family Medicine

## 2020-08-09 DIAGNOSIS — A530 Latent syphilis, unspecified as early or late: Secondary | ICD-10-CM

## 2020-08-09 DIAGNOSIS — B009 Herpesviral infection, unspecified: Secondary | ICD-10-CM

## 2020-08-09 MED ORDER — PENICILLIN G BENZATHINE 1200000 UNIT/2ML IM SUSP
1.2000 10*6.[IU] | Freq: Once | INTRAMUSCULAR | Status: AC
  Administered 2020-08-09: 2.4 10*6.[IU] via INTRAMUSCULAR

## 2020-08-09 MED ORDER — VALACYCLOVIR HCL 1 G PO TABS: ORAL_TABLET | ORAL | 1 refills | Status: DC

## 2020-08-09 NOTE — Progress Notes (Signed)
Pt was given 2 1.2 million units of Bicillin in left and right upper outer quadrants.  Pt tolerated the injections well. She was tearful. I asked if she was alright and she said no. Instructed her to have a seat and she declined. Pt immediately left the room.

## 2020-08-10 ENCOUNTER — Encounter: Payer: Self-pay | Admitting: Family Medicine

## 2020-08-10 ENCOUNTER — Ambulatory Visit (INDEPENDENT_AMBULATORY_CARE_PROVIDER_SITE_OTHER): Payer: Managed Care, Other (non HMO) | Admitting: Family Medicine

## 2020-08-10 DIAGNOSIS — F332 Major depressive disorder, recurrent severe without psychotic features: Secondary | ICD-10-CM

## 2020-08-10 MED ORDER — ESCITALOPRAM OXALATE 10 MG PO TABS: 10.0000 mg | ORAL_TABLET | Freq: Every day | ORAL | 2 refills | Status: DC

## 2020-08-10 NOTE — Progress Notes (Signed)
Virtual Visit via Telephone Note  I connected with Erica [name] on 08/10/20 at 8:15 AM by telephone and verified that I am speaking with the correct person using two identifiers. Erica [name] is currently located at home and nobody is currently with her during this visit. The provider, Gwenlyn Fudge, FNP is located in their home at time of visit.  I discussed the limitations, risks, security and privacy concerns of performing an evaluation and management service by telephone and the availability of in person appointments. I also discussed with the patient that there may be a patient responsible charge related to this service. The patient expressed understanding and agreed to proceed.  Subjective: PCP: Gabriel Earing, FNP  Chief Complaint  Patient presents with  . Depression   Patient feels she has always struggled with depression, especially postpartum. She was treated postpartum for a few months. Feels here recently the depression is getting worse. She is single mom of three kids and has recently gone back to college. She does not remember the name of the medication she was put on but remembers she did not have a sex drive and she was breastfeeding (I suspect it was sertraline).  Depression screen [facility] 2/9 08/10/2020 08/05/2020 12/16/2018  Decreased Interest 3 0 0  Down, Depressed, Hopeless 3 0 0  PHQ - 2 Score 6 0 0  Altered sleeping 3 - -  Tired, decreased energy 3 - -  Change in appetite 3 - -  Feeling bad or failure about yourself  3 - -  Trouble concentrating 1 - -  Moving slowly or fidgety/restless 0 - -  Suicidal thoughts 0 - -  PHQ-9 Score 19 - -  Difficult doing work/chores Very difficult - -   GAD 7 : Generalized Anxiety Score 08/10/2020  Nervous, Anxious, on Edge 0  Control/stop worrying 1  Worry too much - different things 1  Trouble relaxing 3  Restless 1  Easily annoyed or irritable 1  Afraid - awful might happen 0  Total GAD 7 Score 7  Anxiety  Difficulty Somewhat difficult    ROS: Per HPI  Current Outpatient Medications:  .  AUROVELA FE 1/20 1-20 MG-MCG tablet, TAKE 1 TABLET BY MOUTH DAILY, Disp: 28 tablet, Rfl: 12 .  valACYclovir (VALTREX) 1000 MG tablet, Take 2 PO BID x 1 day at blister onset., Disp: 12 tablet, Rfl: 1  No Known Allergies Past Medical History:  Diagnosis Date  . Anemia   . Chlamydia 07/06/14  . Chlamydia infection 08/02/2014  . Chlamydia infection during pregnancy, antepartum 07/27/2014   Had +CHL 2/9 treated in hospital 2/19 will check POT 3/1  . Elevated BP 07/27/2014  . Ganglion cyst of wrist    left  . Pelvic pain in female 07/27/2014  . Pregnant 12/29/2014  . Screening for STD (sexually transmitted disease) 07/27/2014  . Sinus infection 12/29/2014  . Strep throat   . Vaginal discharge 12/29/2014  . Yeast infection 04/07/2013    Observations/Objective: A&O  No respiratory distress or wheezing audible over the phone Mood, judgement, and thought processes all WNL   Assessment and Plan: 1. Major depressive disorder, recurrent severe without psychotic features (HCC) Uncontrolled.  Started patient on Lexapro 10 mg once daily.  I suspect she has previously been on sertraline which was effective in treating her depression, but caused a decreased sex drive. - escitalopram (LEXAPRO) 10 MG tablet; Take 1 tablet (10 mg total) by mouth daily.  Dispense: 30 tablet; Refill:  2   Follow Up Instructions: Return in about 6 weeks (around 09/21/2020) for depression (may be telephone).  I discussed the assessment and treatment plan with the patient. The patient was provided an opportunity to ask questions and all were answered. The patient agreed with the plan and demonstrated an understanding of the instructions.   The patient was advised to call back or seek an in-person evaluation if the symptoms worsen or if the condition fails to improve as anticipated.  The above assessment and management plan was discussed with the  patient. The patient verbalized understanding of and has agreed to the management plan. Patient is aware to call the clinic if symptoms persist or worsen. Patient is aware when to return to the clinic for a follow-up visit. Patient educated on when it is appropriate to go to the emergency department.   Time call ended: 8:27 AM  I provided 12 minutes of non-face-to-face time during this encounter.  Deliah Boston, MSN, APRN, FNP-C [facility] 08/10/20

## 2020-08-11 LAB — SPECIMEN STATUS REPORT

## 2020-08-11 LAB — RPR, QUANT+TP ABS (REFLEX)
Rapid Plasma Reagin, Quant: 1:2 {titer} — ABNORMAL HIGH
T Pallidum Abs: NONREACTIVE

## 2020-08-11 LAB — RPR: RPR Ser Ql: REACTIVE — AB

## 2020-09-16 ENCOUNTER — Other Ambulatory Visit: Payer: Managed Care, Other (non HMO)

## 2020-09-20 ENCOUNTER — Ambulatory Visit (INDEPENDENT_AMBULATORY_CARE_PROVIDER_SITE_OTHER): Payer: Managed Care, Other (non HMO) | Admitting: Family Medicine

## 2020-09-20 ENCOUNTER — Encounter: Payer: Self-pay | Admitting: Family Medicine

## 2020-09-20 DIAGNOSIS — F332 Major depressive disorder, recurrent severe without psychotic features: Secondary | ICD-10-CM

## 2020-09-20 MED ORDER — FLUOXETINE HCL 20 MG PO TABS: 20.0000 mg | ORAL_TABLET | Freq: Every day | ORAL | 2 refills | Status: DC

## 2020-09-20 NOTE — Progress Notes (Signed)
Virtual Visit via Telephone Note  I connected with Erica [name] on 09/20/20 at 8:08 AM by telephone and verified that I am speaking with the correct person using two identifiers. Erica [name] is currently located at home and her son is currently with her during this visit. The provider, Gwenlyn Fudge, FNP is located in their office at time of visit.  I discussed the limitations, risks, security and privacy concerns of performing an evaluation and management service by telephone and the availability of in person appointments. I also discussed with the patient that there may be a patient responsible charge related to this service. The patient expressed understanding and agreed to proceed.  Subjective: PCP: Gabriel Earing, FNP  Chief Complaint  Patient presents with  . Depression   Patient is following up on depression. She was started on Lexapro 10 mg once daily ~6 weeks ago.  She reports that she does not feel like it is helping and she would like to try something else.  Depression screen [facility] 2/9 09/20/2020 08/10/2020 08/05/2020  Decreased Interest 1 3 0  Down, Depressed, Hopeless 1 3 0  PHQ - 2 Score 2 6 0  Altered sleeping 3 3 -  Tired, decreased energy 3 3 -  Change in appetite 0 3 -  Feeling bad or failure about yourself  1 3 -  Trouble concentrating 0 1 -  Moving slowly or fidgety/restless 0 0 -  Suicidal thoughts 0 0 -  PHQ-9 Score 9 19 -  Difficult doing work/chores Somewhat difficult Very difficult -    ROS: Per HPI  Current Outpatient Medications:  .  AUROVELA FE 1/20 1-20 MG-MCG tablet, TAKE 1 TABLET BY MOUTH DAILY, Disp: 28 tablet, Rfl: 12 .  escitalopram (LEXAPRO) 10 MG tablet, Take 1 tablet (10 mg total) by mouth daily., Disp: 30 tablet, Rfl: 2 .  valACYclovir (VALTREX) 1000 MG tablet, Take 2 PO BID x 1 day at blister onset., Disp: 12 tablet, Rfl: 1  No Known Allergies Past Medical History:  Diagnosis Date  . Anemia   . Chlamydia 07/06/14  .  Chlamydia infection 08/02/2014  . Chlamydia infection during pregnancy, antepartum 07/27/2014   Had +CHL 2/9 treated in hospital 2/19 will check POT 3/1  . Elevated BP 07/27/2014  . Ganglion cyst of wrist    left  . Pelvic pain in female 07/27/2014  . Pregnant 12/29/2014  . Screening for STD (sexually transmitted disease) 07/27/2014  . Sinus infection 12/29/2014  . Strep throat   . Vaginal discharge 12/29/2014  . Yeast infection 04/07/2013    Observations/Objective: A&O  No respiratory distress or wheezing audible over the phone Mood, judgement, and thought processes all WNL   Assessment and Plan: 1. Major depressive disorder, recurrent severe without psychotic features (HCC) Improving per score, but not per patient.  She would like to try something else.  Lexapro discontinued.  Rx'd Prozac. - FLUoxetine (PROZAC) 20 MG tablet; Take 1 tablet (20 mg total) by mouth daily.  Dispense: 30 tablet; Refill: 2   Follow Up Instructions: Return in about 6 weeks (around 11/01/2020) for depression (me or PCP).  I discussed the assessment and treatment plan with the patient. The patient was provided an opportunity to ask questions and all were answered. The patient agreed with the plan and demonstrated an understanding of the instructions.   The patient was advised to call back or seek an in-person evaluation if the symptoms worsen or if the condition fails to  improve as anticipated.  The above assessment and management plan was discussed with the patient. The patient verbalized understanding of and has agreed to the management plan. Patient is aware to call the clinic if symptoms persist or worsen. Patient is aware when to return to the clinic for a follow-up visit. Patient educated on when it is appropriate to go to the emergency department.   Time call ended: 8:19 Am  I provided 11 minutes of non-face-to-face time during this encounter.  Deliah Boston, MSN, APRN, FNP-C [facility] 09/20/20

## 2020-09-30 ENCOUNTER — Other Ambulatory Visit: Payer: Self-pay

## 2020-09-30 ENCOUNTER — Other Ambulatory Visit (INDEPENDENT_AMBULATORY_CARE_PROVIDER_SITE_OTHER): Payer: Managed Care, Other (non HMO)

## 2020-09-30 ENCOUNTER — Other Ambulatory Visit (HOSPITAL_COMMUNITY)
Admission: RE | Admit: 2020-09-30 | Discharge: 2020-09-30 | Disposition: A | Discharge status: Home / Self Care | Payer: Managed Care, Other (non HMO) | Source: Ambulatory Visit | Attending: Obstetrics & Gynecology | Admitting: Obstetrics & Gynecology

## 2020-09-30 DIAGNOSIS — Z113 Encounter for screening for infections with a predominantly sexual mode of transmission: Secondary | ICD-10-CM | POA: Insufficient documentation

## 2020-09-30 NOTE — Progress Notes (Signed)
   NURSE VISIT- VAGINITIS/STD/POC  SUBJECTIVE:  Erica [name] is a 25 y.o. [phone] GYN patientfemale here for a vaginal swab for STD screen.  She reports the following symptoms: none for 0 days. Denies abnormal vaginal bleeding, significant pelvic pain, fever, or UTI symptoms.  OBJECTIVE:  There were no vitals taken for this visit.  Appears well, in no apparent distress  ASSESSMENT: Vaginal swab for STD screen  PLAN: Self-collected vaginal probe for Gonorrhea, Chlamydia, Trichomonas, Bacterial Vaginosis, Yeast sent to lab Treatment: to be determined once results are received Follow-up as needed if symptoms persist/worsen, or new symptoms develop  Annamarie Dawley  09/30/2020 10:41 AM

## 2020-09-30 NOTE — Progress Notes (Signed)
Chart reviewed for nurse visit. Agree with plan of care.  Adline Potter, NP 09/30/2020 1:02 PM

## 2020-10-04 LAB — CERVICOVAGINAL ANCILLARY ONLY
Bacterial Vaginitis (gardnerella): NEGATIVE
Candida Glabrata: NEGATIVE
Candida Vaginitis: NEGATIVE
Chlamydia: NEGATIVE
Comment: NEGATIVE
Comment: NEGATIVE
Comment: NEGATIVE
Comment: NEGATIVE
Comment: NEGATIVE
Comment: NORMAL
Neisseria Gonorrhea: NEGATIVE
Trichomonas: NEGATIVE

## 2020-11-18 ENCOUNTER — Telehealth: Payer: Self-pay | Admitting: Family Medicine

## 2020-11-18 NOTE — Telephone Encounter (Signed)
Pt called stating that she made an appt to see her PCP on 6/28 for depression follow up and wants to know if it can be a televisit or does it need to be in person?  Please advise and call patient.

## 2020-11-18 NOTE — Telephone Encounter (Signed)
Please advise ok for phone or does she need in person visit?

## 2020-11-18 NOTE — Telephone Encounter (Signed)
I am okay with this being a telephone visit.

## 2020-11-18 NOTE — Telephone Encounter (Signed)
Pt aware, changed appt

## 2020-11-22 ENCOUNTER — Encounter: Payer: Self-pay | Admitting: Family Medicine

## 2020-11-22 ENCOUNTER — Ambulatory Visit (INDEPENDENT_AMBULATORY_CARE_PROVIDER_SITE_OTHER): Payer: Managed Care, Other (non HMO) | Admitting: Family Medicine

## 2020-11-22 DIAGNOSIS — F332 Major depressive disorder, recurrent severe without psychotic features: Secondary | ICD-10-CM | POA: Diagnosis not present

## 2020-11-22 MED ORDER — ESCITALOPRAM OXALATE 10 MG PO TABS: 10.0000 mg | ORAL_TABLET | Freq: Every day | ORAL | 2 refills | Status: DC

## 2020-11-22 NOTE — Progress Notes (Signed)
Virtual Visit via Telephone Note  I connected with Erica [name] on 11/22/20 at 3:21 PM by telephone and verified that I am speaking with the correct person using two identifiers. Erica [name] is currently located at home and her kids are currently with her during this visit. The provider, Gwenlyn Fudge, FNP is located in their home at time of visit.  I discussed the limitations, risks, security and privacy concerns of performing an evaluation and management service by telephone and the availability of in person appointments. I also discussed with the patient that there may be a patient responsible charge related to this service. The patient expressed understanding and agreed to proceed.  Subjective: PCP: Gabriel Earing, FNP  Chief Complaint  Patient presents with   Depression   Patient is following up on depression.  At our last visit Lexapro was changed to Prozac as patient was not sure that Lexapro was helping.  She reports today that she could not afford the Prozac as it was going to cost $40-$50, so she has not been taking anything to see if she really needed it.  She reports today she realizes she really needs the medication and that Lexapro was actually helping.  Depression screen [facility] 2/9 11/22/2020 09/20/2020 08/10/2020  Decreased Interest 3 1 3   Down, Depressed, Hopeless 3 1 3   PHQ - 2 Score 6 2 6   Altered sleeping 3 3 3   Tired, decreased energy 3 3 3   Change in appetite 3 0 3  Feeling bad or failure about yourself  3 1 3   Trouble concentrating 3 0 1  Moving slowly or fidgety/restless 1 0 0  Suicidal thoughts 0 0 0  PHQ-9 Score 22 9 19   Difficult doing work/chores Very difficult Somewhat difficult Very difficult    ROS: Per HPI  Current Outpatient Medications:    AUROVELA FE 1/20 1-20 MG-MCG tablet, TAKE 1 TABLET BY MOUTH DAILY, Disp: 28 tablet, Rfl: 12   FLUoxetine (PROZAC) 20 MG tablet, Take 1 tablet (20 mg total) by mouth daily., Disp: 30 tablet,  Rfl: 2   valACYclovir (VALTREX) 1000 MG tablet, Take 2 PO BID x 1 day at blister onset., Disp: 12 tablet, Rfl: 1  No Known Allergies Past Medical History:  Diagnosis Date   Anemia    Chlamydia 07/06/14   Chlamydia infection 08/02/2014   Chlamydia infection during pregnancy, antepartum 07/27/2014   Had +CHL 2/9 treated in hospital 2/19 will check POT 3/1   Elevated BP 07/27/2014   Ganglion cyst of wrist    left   Pelvic pain in female 07/27/2014   Pregnant 12/29/2014   Screening for STD (sexually transmitted disease) 07/27/2014   Sinus infection 12/29/2014   Strep throat    Vaginal discharge 12/29/2014   Yeast infection 04/07/2013    Observations/Objective: A&O  No respiratory distress or wheezing audible over the phone Mood, judgement, and thought processes all WNL   Assessment and Plan: 1. Major depressive disorder, recurrent severe without psychotic features (HCC) Uncontrolled.  Restarted Lexapro 10 mg once daily. - escitalopram (LEXAPRO) 10 MG tablet; Take 1 tablet (10 mg total) by mouth daily.  Dispense: 30 tablet; Refill: 2   Follow Up Instructions: Return in about 6 weeks (around 01/03/2021) for depression.  I discussed the assessment and treatment plan with the patient. The patient was provided an opportunity to ask questions and all were answered. The patient agreed with the plan and demonstrated an understanding of the instructions.   The  patient was advised to call back or seek an in-person evaluation if the symptoms worsen or if the condition fails to improve as anticipated.  The above assessment and management plan was discussed with the patient. The patient verbalized understanding of and has agreed to the management plan. Patient is aware to call the clinic if symptoms persist or worsen. Patient is aware when to return to the clinic for a follow-up visit. Patient educated on when it is appropriate to go to the emergency department.   Time call ended: 3:32 PM  I provided 11  minutes of non-face-to-face time during this encounter.  Deliah Boston, MSN, APRN, FNP-C [facility] 11/22/20

## 2020-12-14 ENCOUNTER — Other Ambulatory Visit: Payer: Self-pay | Admitting: Women's Health

## 2020-12-14 ENCOUNTER — Telehealth: Payer: Self-pay

## 2020-12-14 MED ORDER — NITROFURANTOIN MONOHYD MACRO 100 MG PO CAPS: 100.0000 mg | ORAL_CAPSULE | Freq: Two times a day (BID) | ORAL | 0 refills | Status: DC

## 2020-12-14 NOTE — Telephone Encounter (Signed)
Pt called wanting to make an appt for a possible UTI. Pt was told she would have to leave a sample for urinalysis. Pt asked to speak to a nurse. Call was transferred to myself. I informed the pt that she would need to leave a sample so that the correct antibiotic could be prescribed for the specific bacterial strain that is possibly present. She stated that she worked Monday through Friday from 7:55 am to 5:30 pm. I informed her that our office was closed during those times and she would need to go to an urgent care if she was unable to come to our office during our office hours. She stated that she'd been seen here before and given the same medication each time and wanted to speak to [facility] personally. She was informed that Victorino Dike was out for the week, to which she asked to speak to [employer]. I informed her that I would send [name] a msg. Pt confirmed understanding.

## 2021-02-13 ENCOUNTER — Other Ambulatory Visit (HOSPITAL_COMMUNITY)
Admission: RE | Admit: 2021-02-13 | Discharge: 2021-02-13 | Disposition: A | Discharge status: Home / Self Care | Payer: Managed Care, Other (non HMO) | Source: Ambulatory Visit | Attending: Obstetrics & Gynecology | Admitting: Obstetrics & Gynecology

## 2021-02-13 ENCOUNTER — Other Ambulatory Visit (INDEPENDENT_AMBULATORY_CARE_PROVIDER_SITE_OTHER): Payer: Managed Care, Other (non HMO)

## 2021-02-13 ENCOUNTER — Other Ambulatory Visit: Payer: Self-pay

## 2021-02-13 DIAGNOSIS — B9689 Other specified bacterial agents as the cause of diseases classified elsewhere: Secondary | ICD-10-CM | POA: Diagnosis not present

## 2021-02-13 DIAGNOSIS — R399 Unspecified symptoms and signs involving the genitourinary system: Secondary | ICD-10-CM | POA: Diagnosis not present

## 2021-02-13 DIAGNOSIS — Z113 Encounter for screening for infections with a predominantly sexual mode of transmission: Secondary | ICD-10-CM | POA: Insufficient documentation

## 2021-02-13 DIAGNOSIS — N760 Acute vaginitis: Secondary | ICD-10-CM | POA: Insufficient documentation

## 2021-02-13 LAB — POCT URINALYSIS DIPSTICK OB
Glucose, UA: NEGATIVE
Ketones, UA: NEGATIVE
Nitrite, UA: NEGATIVE
POC,PROTEIN,UA: NEGATIVE

## 2021-02-13 NOTE — Progress Notes (Signed)
Chart reviewed for nurse visit. Agree with plan of care.  Adline Potter, NP 02/13/2021 3:32 PM

## 2021-02-13 NOTE — Progress Notes (Signed)
   NURSE VISIT- VAGINITIS/STD  SUBJECTIVE:  Erica [name] is a 25 y.o. [phone] GYN patientfemale here for a vaginal swab for vaginitis screening, STD screen.  She reports the following symptoms: none. Denies abnormal vaginal bleeding, significant pelvic pain, fever, or UTI symptoms.  OBJECTIVE:  There were no vitals taken for this visit.  Appears well, in no apparent distress  ASSESSMENT: Vaginal swab for  vaginitis & STD screening  PLAN: Self-collected vaginal probe for Gonorrhea, Chlamydia, Trichomonas, Bacterial Vaginosis, Yeast sent to lab, blood work in lab requested Treatment: to be determined once results are received Follow-up as needed if symptoms persist/worsen, or new symptoms develop    NURSE VISIT- UTI SYMPTOMS   SUBJECTIVE:  Erica [name] is a 25 y.o. [phone] female here for UTI symptoms. She is a GYN patient. She reports cloudy urine, urinary frequency, urinary hesitancy, and urinary urgency.  OBJECTIVE:  There were no vitals taken for this visit.  Appears well, in no apparent distress  Results for orders placed or performed in visit on 02/13/21 (from the past 24 hour(s))  POC Urinalysis Dipstick OB   Collection Time: 02/13/21  3:01 PM  Result Value Ref Range   Color, UA     Clarity, UA     Glucose, UA Negative Negative   Bilirubin, UA     Ketones, UA neg    Spec Grav, UA     Blood, UA trace non-hemolyzed    pH, UA     POC,PROTEIN,UA Negative Negative, Trace, Small (1+), Moderate (2+), Large (3+), 4+   Urobilinogen, UA     Nitrite, UA neg    Leukocytes, UA Moderate (2+) (A) Negative   Appearance     Odor      ASSESSMENT: GYN patient with UTI symptoms and negative nitrites  PLAN: Note routed to Cyril Mourning, AGNP   Rx sent by provider today: No Urine culture sent Call or return to clinic prn if these symptoms worsen or fail to improve as anticipated. Follow-up: as needed   Makena Murdock A Azari Janssens  02/13/2021 3:05 PM

## 2021-02-14 ENCOUNTER — Telehealth: Payer: Self-pay

## 2021-02-14 LAB — URINALYSIS, ROUTINE W REFLEX MICROSCOPIC
Bilirubin, UA: NEGATIVE
Glucose, UA: NEGATIVE
Ketones, UA: NEGATIVE
Nitrite, UA: NEGATIVE
Protein,UA: NEGATIVE
RBC, UA: NEGATIVE
Specific Gravity, UA: 1.012 (ref 1.005–1.030)
Urobilinogen, Ur: 0.2 mg/dL (ref 0.2–1.0)
pH, UA: 7 (ref 5.0–7.5)

## 2021-02-14 LAB — MICROSCOPIC EXAMINATION: Casts: NONE SEEN /lpf

## 2021-02-14 LAB — HEPATITIS C ANTIBODY: Hep C Virus Ab: <0.1 s/co ratio (ref 0.0–0.9)

## 2021-02-14 LAB — RPR: RPR Ser Ql: NONREACTIVE

## 2021-02-14 LAB — HIV ANTIBODY (ROUTINE TESTING W REFLEX): HIV Screen 4th Generation wRfx: NONREACTIVE

## 2021-02-14 NOTE — Telephone Encounter (Signed)
Left message that HIV,RPR and hepatitis C antibody negative, Urine culture not back yet

## 2021-02-14 NOTE — Telephone Encounter (Signed)
Patient calling stating that she got a msg throug my chart but was not about to send a msg back wanting make to see if someone would call her so that she could make sure she dosen't have a uti

## 2021-02-14 NOTE — Telephone Encounter (Signed)
Returned pt's call. Two identifiers used. Pt asked why her UA results were negative with elevated WBC's and leukocytes. Told pt her concern would be forwarded to Erica [name] for more info. Pt confirmed understanding.

## 2021-02-15 ENCOUNTER — Other Ambulatory Visit: Payer: Self-pay | Admitting: Adult Health

## 2021-02-15 LAB — CERVICOVAGINAL ANCILLARY ONLY
Bacterial Vaginitis (gardnerella): POSITIVE — AB
Candida Glabrata: NEGATIVE
Candida Vaginitis: NEGATIVE
Chlamydia: NEGATIVE
Comment: NEGATIVE
Comment: NEGATIVE
Comment: NEGATIVE
Comment: NEGATIVE
Comment: NEGATIVE
Comment: NORMAL
Neisseria Gonorrhea: NEGATIVE
Trichomonas: NEGATIVE

## 2021-02-15 MED ORDER — METRONIDAZOLE 500 MG PO TABS: 500.0000 mg | ORAL_TABLET | Freq: Two times a day (BID) | ORAL | 0 refills | Status: DC

## 2021-02-19 LAB — URINE CULTURE

## 2021-02-20 ENCOUNTER — Other Ambulatory Visit: Payer: Self-pay | Admitting: Adult Health

## 2021-02-20 MED ORDER — SULFAMETHOXAZOLE-TRIMETHOPRIM 800-160 MG PO TABS
1.0000 | ORAL_TABLET | Freq: Two times a day (BID) | ORAL | 0 refills | Status: DC
Start: 2021-02-20 — End: 2021-05-11

## 2021-02-22 ENCOUNTER — Telehealth: Payer: Self-pay

## 2021-02-22 MED ORDER — FLUCONAZOLE 150 MG PO TABS: ORAL_TABLET | ORAL | 1 refills | Status: DC

## 2021-02-22 NOTE — Addendum Note (Signed)
Addended by: Cyril Mourning A on: 02/22/2021 01:39 PM   Modules accepted: Orders

## 2021-02-22 NOTE — Telephone Encounter (Signed)
Pt aware that rx sent for diflucan

## 2021-02-22 NOTE — Telephone Encounter (Signed)
Called pt per Mychart request. Two identifiers used. Pt was confused about taking two different medications. Explained to pt her lab results and why she was prescribed the medications. Pt stated that she was beginning to get a yeast infection. Told pt I would forward her concerns to [employer] for advisement.

## 2021-02-27 ENCOUNTER — Other Ambulatory Visit: Payer: Self-pay | Admitting: Family Medicine

## 2021-02-27 DIAGNOSIS — F332 Major depressive disorder, recurrent severe without psychotic features: Secondary | ICD-10-CM

## 2021-04-24 ENCOUNTER — Telehealth: Payer: Self-pay | Admitting: Family Medicine

## 2021-04-24 NOTE — Telephone Encounter (Signed)
Pt scheduled with Harlow Mares 05/11/21 at 4:15 to discuss weight management.

## 2021-05-11 ENCOUNTER — Encounter: Payer: Self-pay | Admitting: Family Medicine

## 2021-05-11 ENCOUNTER — Ambulatory Visit (INDEPENDENT_AMBULATORY_CARE_PROVIDER_SITE_OTHER): Payer: Managed Care, Other (non HMO) | Admitting: Family Medicine

## 2021-05-11 VITALS — BP 124/76 | HR 82 | Temp 98.1°F | Ht 63.0 in | Wt 190.5 lb

## 2021-05-11 DIAGNOSIS — F3340 Major depressive disorder, recurrent, in remission, unspecified: Secondary | ICD-10-CM | POA: Diagnosis not present

## 2021-05-11 DIAGNOSIS — L0100 Impetigo, unspecified: Secondary | ICD-10-CM

## 2021-05-11 DIAGNOSIS — E6609 Other obesity due to excess calories: Secondary | ICD-10-CM

## 2021-05-11 DIAGNOSIS — Z6833 Body mass index (BMI) 33.0-33.9, adult: Secondary | ICD-10-CM

## 2021-05-11 DIAGNOSIS — R21 Rash and other nonspecific skin eruption: Secondary | ICD-10-CM

## 2021-05-11 MED ORDER — MUPIROCIN 2 % EX OINT: 1.0000 "application " | TOPICAL_OINTMENT | Freq: Two times a day (BID) | CUTANEOUS | 0 refills | Status: DC

## 2021-05-11 MED ORDER — NYSTATIN 100000 UNIT/GM EX CREA: 1.0000 "application " | TOPICAL_CREAM | Freq: Two times a day (BID) | CUTANEOUS | 0 refills | Status: DC

## 2021-05-11 MED ORDER — PHENTERMINE HCL 37.5 MG PO TABS
37.5000 mg | ORAL_TABLET | Freq: Every day | ORAL | 0 refills | Status: DC
Start: 2021-05-11 — End: 2021-06-13

## 2021-05-11 NOTE — Patient Instructions (Signed)
Obesity, Adult ?Obesity is the condition of having too much total body fat. Being overweight or obese means that your weight is greater than what is considered healthy for your body size. Obesity is determined by a measurement called BMI (body mass index). BMI is an estimate of body fat and is calculated from height and weight. For adults, a BMI of 30 or higher is considered obese. ?Obesity can lead to other health concerns and major illnesses, including: ?Stroke. ?Coronary artery disease (CAD). ?Type 2 diabetes. ?Some types of cancer, including cancers of the colon, breast, uterus, and gallbladder. ?High blood pressure (hypertension). ?High cholesterol. ?Gallbladder stones. ?Obesity can also contribute to: ?Osteoarthritis. ?Sleep apnea. ?Infertility problems. ?What are the causes? ?Common causes of this condition include: ?Eating daily meals that are high in calories, sugar, and fat. ?Drinking high amounts of sugar-sweetened beverages, such as soft drinks. ?Being born with genes that may make you more likely to become obese. ?Having a medical condition that causes obesity, including: ?Hypothyroidism. ?Polycystic ovarian syndrome (PCOS). ?Binge-eating disorder. ?Cushing syndrome. ?Taking certain medicines, such as steroids, antidepressants, and seizure medicines. ?Not being physically active (sedentary lifestyle). ?Not getting enough sleep. ?What increases the risk? ?The following factors may make you more likely to develop this condition: ?Having a family history of obesity. ?Living in an area with limited access to: ?Parks, recreation centers, or sidewalks. ?Healthy food choices, such as grocery stores and farmers' markets. ?What are the signs or symptoms? ?The main sign of this condition is having too much body fat. ?How is this diagnosed? ?This condition is diagnosed based on: ?Your BMI. If you are an adult with a BMI of 30 or higher, you are considered obese. ?Your waist circumference. This measures the  distance around your waistline. ?Your skinfold thickness. Your health care provider may gently pinch a fold of your skin and measure it. ?You may have other tests to check for underlying conditions. ?How is this treated? ?Treatment for this condition often includes changing your lifestyle. Treatment may include some or all of the following: ?Dietary changes. This may include developing a healthy meal plan. ?Regular physical activity. This may include activity that causes your heart to beat faster (aerobic exercise) and strength training. Work with your health care provider to design an exercise program that works for you. ?Medicine to help you lose weight if you are unable to lose one pound a week after six weeks of healthy eating and more physical activity. ?Treating conditions that cause the obesity (underlying conditions). ?Surgery. Surgical options may include gastric banding and gastric bypass. Surgery may be done if: ?Other treatments have not helped to improve your condition. ?You have a BMI of 40 or higher. ?You have life-threatening health problems related to obesity. ?Follow these instructions at home: ?Eating and drinking ? ?Follow recommendations from your health care provider about what you eat and drink. Your health care provider may advise you to: ?Limit fast food, sweets, and processed snack foods. ?Choose low-fat options, such as low-fat milk instead of whole milk. ?Eat five or more servings of fruits or vegetables every day. ?Choose healthy foods when you eat out. ?Keep low-fat snacks available. ?Limit sugary drinks, such as soda, fruit juice, sweetened iced tea, and flavored milk. ?Drink enough water to keep your urine pale yellow. ?Do not follow a fad diet. Fad diets can be unhealthy and even dangerous. ?Other healthful choices include: ?Eat at home more often. This gives you more control over what you eat. ?Learn to read food labels.   This will help you understand how much food is considered one  serving. ?Learn what a healthy serving size is. ?Physical activity ?Exercise regularly, as told by your health care provider. ?Most adults should get up to 150 minutes of moderate-intensity exercise every week. ?Ask your health care provider what types of exercise are safe for you and how often you should exercise. ?Warm up and stretch before being active. ?Cool down and stretch after being active. ?Rest between periods of activity. ?Lifestyle ?Work with your health care provider and a dietitian to set a weight-loss goal that is healthy and reasonable for you. ?Limit your screen time. ?Find ways to reward yourself that do not involve food. ?Do not drink alcohol if: ?Your health care provider tells you not to drink. ?You are pregnant, may be pregnant, or are planning to become pregnant. ?If you drink alcohol: ?Limit how much you have to: ?0-1 drink a day for women. ?0-2 drinks a day for men. ?Know how much alcohol is in your drink. In the [place] one drink equals one 12 oz bottle of beer (355 mL), one 5 oz glass of wine (148 mL), or one 1? oz glass of hard liquor (44 mL). ?General instructions ?Keep a weight-loss journal to keep track of the food you eat and how much exercise you get. ?Take over-the-counter and prescription medicines only as told by your health care provider. ?Take vitamins and supplements only as told by your health care provider. ?Consider joining a support group. Your health care provider may be able to recommend a support group. ?Pay attention to your mental health as obesity can lead to depression or self esteem issues. ?Keep all follow-up visits. This is important. ?Contact a health care provider if: ?You are unable to meet your weight-loss goal after six weeks of dietary and lifestyle changes. ?You have trouble breathing. ?Summary ?Obesity is the condition of having too much total body fat. ?Being overweight or obese means that your weight is greater than what is considered healthy for your body  size. ?Work with your health care provider and a dietitian to set a weight-loss goal that is healthy and reasonable for you. ?Exercise regularly, as told by your health care provider. Ask your health care provider what types of exercise are safe for you and how often you should exercise. ?This information is not intended to replace advice given to you by your health care provider. Make sure you discuss any questions you have with your health care provider. ?Document Revised: 12/20/2020 Document Reviewed: 12/20/2020 ?Elsevier Patient Education ? 2022 Elsevier Inc. ? ?

## 2021-05-11 NOTE — Progress Notes (Signed)
Established Patient Office Visit  Subjective:  Patient ID: Erica [name], female    DOB: [birth year]  Age: 25 y.o. MRN: [id number]  CC:  Chief Complaint  Patient presents with   Obesity    HPI Erica [name] presents for obesity. She was going to a weight loss clinic and was given phentermine. She did well on phentermine without side effects. She stopped going to the weight loss clinic because they did not file with her insurance and it was becoming expensive. She has been off of phentermine for 1 month.   She reports that she eats a well balanced diet. She is drinking a gallon of water a day. She does weight lifting daily. She walks 3-5x a week.   She also has impetigo on her nose. This has been present for about 1 week. She has had this in the past and responded well to mupirocin.    She has has a rash on her upper medial thighs x 10 days. It is itchy. She has tried Eucerin, mupirocin, and rubbing alcohol without improvement.   Depression screen [facility] 2/9 05/11/2021 11/22/2020 09/20/2020  Decreased Interest 0 3 1  Down, Depressed, Hopeless 0 3 1  PHQ - 2 Score 0 6 2  Altered sleeping '3 3 3  ' Tired, decreased energy '1 3 3  ' Change in appetite 0 3 0  Feeling bad or failure about yourself  '1 3 1  ' Trouble concentrating 0 3 0  Moving slowly or fidgety/restless 0 1 0  Suicidal thoughts 0 0 0  PHQ-9 Score '5 22 9  ' Difficult doing work/chores Not difficult at all Very difficult Somewhat difficult   GAD 7 : Generalized Anxiety Score 05/11/2021 08/10/2020  Nervous, Anxious, on Edge 0 0  Control/stop worrying 0 1  Worry too much - different things 1 1  Trouble relaxing 1 3  Restless 0 1  Easily annoyed or irritable 0 1  Afraid - awful might happen 1 0  Total GAD 7 Score 3 7  Anxiety Difficulty Not difficult at all Somewhat difficult      Past Medical History:  Diagnosis Date   Anemia    Chlamydia 07/06/14   Chlamydia infection 08/02/2014   Chlamydia infection during  pregnancy, antepartum 07/27/2014   Had +CHL 2/9 treated in hospital 2/19 will check POT 3/1   Elevated BP 07/27/2014   Ganglion cyst of wrist    left   Pelvic pain in female 07/27/2014   Pregnant 12/29/2014   Screening for STD (sexually transmitted disease) 07/27/2014   Sinus infection 12/29/2014   Strep throat    Vaginal discharge 12/29/2014   Yeast infection 04/07/2013    Past Surgical History:  Procedure Laterality Date   NO PAST SURGERIES      Family History  Problem Relation Age of Onset   Diabetes Maternal Grandmother    Diabetes Paternal Grandmother    Multiple sclerosis Cousin     Social History   Socioeconomic History   Marital status: Legally Separated    Spouse name: Not on file   Number of children: Not on file   Years of education: Not on file   Highest education level: Not on file  Occupational History   Not on file  Tobacco Use   Smoking status: Never   Smokeless tobacco: Never  Vaping Use   Vaping Use: Never used  Substance and Sexual Activity   Alcohol use: No   Drug use: No   Sexual activity: Yes  Birth control/protection: Condom, Pill  Other Topics Concern   Not on file  Social History Narrative   Not on file   Social Determinants of Health   Financial Resource Strain: Not on file  Food Insecurity: Not on file  Transportation Needs: Not on file  Physical Activity: Not on file  Stress: Not on file  Social Connections: Not on file  Intimate Partner Violence: Not on file    Outpatient Medications Prior to Visit  Medication Sig Dispense Refill   AUROVELA FE 1/20 1-20 MG-MCG tablet TAKE 1 TABLET BY MOUTH DAILY 28 tablet 12   fluconazole (DIFLUCAN) 150 MG tablet Take 1 now and 1 in 3 days 2 tablet 1   escitalopram (LEXAPRO) 10 MG tablet Take 1 tablet (10 mg total) by mouth daily. Needs an appointment before further refills 30 tablet 0   metroNIDAZOLE (FLAGYL) 500 MG tablet Take 1 tablet (500 mg total) by mouth 2 (two) times daily. 14 tablet 0    sulfamethoxazole-trimethoprim (BACTRIM DS) 800-160 MG tablet Take 1 tablet by mouth 2 (two) times daily. Take 1 bid 14 tablet 0   No facility-administered medications prior to visit.    No Known Allergies  ROS Review of Systems As per HPI.   Objective:    Physical Exam Vitals and nursing note reviewed.  Constitutional:      General: She is not in acute distress.    Appearance: She is not ill-appearing, toxic-appearing or diaphoretic.  HENT:     Nose:     Comments: Honey crusted nasal lesions present Cardiovascular:     Rate and Rhythm: Normal rate and regular rhythm.     Heart sounds: Normal heart sounds. No murmur heard. Pulmonary:     Effort: Pulmonary effort is normal. No respiratory distress.     Breath sounds: Normal breath sounds.  Musculoskeletal:     Right lower leg: No edema.     Left lower leg: No edema.  Skin:    General: Skin is warm and dry.     Findings: Rash (flesh colored papular rash to medial upper thighs. No signs of infection) present.  Neurological:     General: No focal deficit present.     Mental Status: She is alert and oriented to person, place, and time.    BP 124/76    Pulse 82    Temp 98.1 F (36.7 C) (Temporal)    Ht '5\' 3"'  (1.6 m)    Wt 190 lb 8 oz (86.4 kg)    BMI 33.75 kg/m  Wt Readings from Last 3 Encounters:  05/11/21 190 lb 8 oz (86.4 kg)  08/05/20 174 lb 8 oz (79.2 kg)  09/25/19 194 lb (88 kg)     There are no preventive care reminders to display for this patient.  There are no preventive care reminders to display for this patient.  Lab Results  Component Value Date   TSH 2.410 08/05/2020   Lab Results  Component Value Date   WBC 10.5 08/05/2020   HGB 14.6 08/05/2020   HCT 44.3 08/05/2020   MCV 85 08/05/2020   PLT 327 08/05/2020   Lab Results  Component Value Date   NA 138 08/05/2020   K 4.1 08/05/2020   CO2 20 08/05/2020   GLUCOSE 84 08/05/2020   BUN 11 08/05/2020   CREATININE 0.91 08/05/2020   BILITOT 0.3  08/05/2020   ALKPHOS 59 08/05/2020   AST 16 08/05/2020   ALT 13 08/05/2020   PROT 7.8 08/05/2020  ALBUMIN 4.5 08/05/2020   CALCIUM 9.7 08/05/2020   ANIONGAP 6 10/11/2014   EGFR 89 08/05/2020   Lab Results  Component Value Date   CHOL 217 (H) 08/05/2020   Lab Results  Component Value Date   HDL 61 08/05/2020   Lab Results  Component Value Date   LDLCALC 137 (H) 08/05/2020   Lab Results  Component Value Date   TRIG 105 08/05/2020   Lab Results  Component Value Date   CHOLHDL 3.6 08/05/2020   Lab Results  Component Value Date   HGBA1C 5.4 06/04/2018      Assessment & Plan:   Yaritza was seen today for obesity.  Diagnoses and all orders for this visit:  Class 1 obesity due to excess calories with serious comorbidity and body mass index (BMI) of 33.0 to 33.9 in adult PDMP reviewed, no red flags. Refill provided. Also discussed saxenda today. She would like to start with phentermine and will consider saxenda after doing some research.  -     phentermine (ADIPEX-P) 37.5 MG tablet; Take 1 tablet (37.5 mg total) by mouth daily before breakfast.  Recurrent major depression in remission ([place]) Well controlled. Not currently on medication.   Impetigo Bactroban ordered.  -     mupirocin ointment (BACTROBAN) 2 %; Apply 1 application topically 2 (two) times daily.  Rash Try nystatin as below.  -     nystatin cream (MYCOSTATIN); Apply 1 application topically 2 (two) times daily.   Follow-up: Return in about 4 weeks (around 06/08/2021) for weight management.  The patient indicates understanding of these issues and agrees with the plan.    Gwenlyn Perking, FNP

## 2021-05-15 ENCOUNTER — Telehealth: Payer: Self-pay | Admitting: *Deleted

## 2021-05-15 NOTE — Telephone Encounter (Signed)
[facility]: [id number] - PA Case ID: [id number] - Rx #: [id number] Need help? Call us at [phone] Status Sent to [employer] [facility] Phentermine HCl 37.5MG  tablets

## 2021-05-15 NOTE — Telephone Encounter (Signed)
[facility] Key: [id number] - PA Case ID: [id number] - Rx #: [id number] Need help? Call us at [phone] Outcome [employer] Request Reference Number: [id number]. PHENTERMINE TAB 37.5MG  is approved through 11/13/2021. Your patient may now fill this prescription and it will be covered. Drug Phentermine HCl 37.5MG  tablets Form [employer]   Sent approval to [employer]

## 2021-06-13 ENCOUNTER — Ambulatory Visit (INDEPENDENT_AMBULATORY_CARE_PROVIDER_SITE_OTHER): Payer: Managed Care, Other (non HMO) | Admitting: Family Medicine

## 2021-06-13 ENCOUNTER — Encounter: Payer: Self-pay | Admitting: Family Medicine

## 2021-06-13 VITALS — BP 121/86 | HR 88 | Temp 98.3°F | Ht 63.0 in | Wt 191.0 lb

## 2021-06-13 DIAGNOSIS — Z6833 Body mass index (BMI) 33.0-33.9, adult: Secondary | ICD-10-CM | POA: Diagnosis not present

## 2021-06-13 DIAGNOSIS — Z7689 Persons encountering health services in other specified circumstances: Secondary | ICD-10-CM | POA: Diagnosis not present

## 2021-06-13 DIAGNOSIS — E6609 Other obesity due to excess calories: Secondary | ICD-10-CM

## 2021-06-13 MED ORDER — PHENTERMINE HCL 37.5 MG PO TABS: 37.5000 mg | ORAL_TABLET | Freq: Every day | ORAL | 0 refills | Status: DC

## 2021-06-13 MED ORDER — PHENTERMINE HCL 37.5 MG PO CAPS: 37.5000 mg | ORAL_CAPSULE | ORAL | 0 refills | Status: DC

## 2021-06-13 NOTE — Progress Notes (Signed)
Subjective:  Patient ID: Erica [name], female    DOB: [birth year], 26 y.o.   MRN: [id number]  Patient Care Team: Gwenlyn Perking, FNP as PCP - General (Family Medicine)   Chief Complaint:  Weight Check   HPI: Erica [name] is a 26 y.o. female presenting on 06/13/2021 for Weight Check   Pt presents today for weight management. She was started on Adipex 1 month ago by her PCP. She has not lost any weight since initiation of medications. She does not follow a diet or exercise routine. She denies any adverse side effects from medications. No chest pain, palpitations, anxiety, tremors, insomnia, or leg swelling.   There are no diagnoses linked to this encounter.    Relevant past medical, surgical, family, and social history reviewed and updated as indicated.  Allergies and medications reviewed and updated. Data reviewed: Chart in Epic.   Past Medical History:  Diagnosis Date   Anemia    Chlamydia 07/06/14   Chlamydia infection 08/02/2014   Chlamydia infection during pregnancy, antepartum 07/27/2014   Had +CHL 2/9 treated in hospital 2/19 will check POT 3/1   Elevated BP 07/27/2014   Ganglion cyst of wrist    left   Pelvic pain in female 07/27/2014   Pregnant 12/29/2014   Screening for STD (sexually transmitted disease) 07/27/2014   Sinus infection 12/29/2014   Strep throat    Vaginal discharge 12/29/2014   Yeast infection 04/07/2013    Past Surgical History:  Procedure Laterality Date   NO PAST SURGERIES      Social History   Socioeconomic History   Marital status: Legally Separated    Spouse name: Not on file   Number of children: Not on file   Years of education: Not on file   Highest education level: Not on file  Occupational History   Not on file  Tobacco Use   Smoking status: Never   Smokeless tobacco: Never  Vaping Use   Vaping Use: Never used  Substance and Sexual Activity   Alcohol use: No   Drug use: No   Sexual activity: Yes    Birth  control/protection: Condom, Pill  Other Topics Concern   Not on file  Social History Narrative   Not on file   Social Determinants of Health   Financial Resource Strain: Not on file  Food Insecurity: Not on file  Transportation Needs: Not on file  Physical Activity: Not on file  Stress: Not on file  Social Connections: Not on file  Intimate Partner Violence: Not on file    Outpatient Encounter Medications as of 06/13/2021  Medication Sig   AUROVELA FE 1/20 1-20 MG-MCG tablet TAKE 1 TABLET BY MOUTH DAILY   fluconazole (DIFLUCAN) 150 MG tablet Take 1 now and 1 in 3 days   mupirocin ointment (BACTROBAN) 2 % Apply 1 application topically 2 (two) times daily.   nystatin cream (MYCOSTATIN) Apply 1 application topically 2 (two) times daily.   [START ON 07/13/2021] phentermine 37.5 MG capsule Take 1 capsule (37.5 mg total) by mouth every morning.   [DISCONTINUED] phentermine (ADIPEX-P) 37.5 MG tablet Take 1 tablet (37.5 mg total) by mouth daily before breakfast.   phentermine (ADIPEX-P) 37.5 MG tablet Take 1 tablet (37.5 mg total) by mouth daily before breakfast.   No facility-administered encounter medications on file as of 06/13/2021.    No Known Allergies  Review of Systems  Constitutional:  Negative for activity change, appetite change, chills, diaphoresis, fatigue, fever  and unexpected weight change.  HENT: Negative.    Eyes: Negative.  Negative for photophobia and visual disturbance.  Respiratory:  Negative for cough, chest tightness and shortness of breath.   Cardiovascular:  Negative for chest pain, palpitations and leg swelling.  Gastrointestinal:  Negative for abdominal pain, blood in stool, constipation, diarrhea, nausea and vomiting.  Endocrine: Negative.   Genitourinary:  Negative for decreased urine volume, difficulty urinating, dysuria, frequency and urgency.  Musculoskeletal:  Negative for arthralgias and myalgias.  Skin: Negative.   Allergic/Immunologic: Negative.    Neurological:  Negative for dizziness and headaches.  Hematological: Negative.   Psychiatric/Behavioral:  Negative for confusion, hallucinations, sleep disturbance and suicidal ideas.   All other systems reviewed and are negative.      Objective:  BP 121/86    Pulse 88    Temp 98.3 F (36.8 C)    Ht 5\' 3"  (1.6 m)    Wt 191 lb (86.6 kg)    LMP 05/30/2021 (Approximate)    SpO2 100%    BMI 33.83 kg/m    Wt Readings from Last 3 Encounters:  06/13/21 191 lb (86.6 kg)  05/11/21 190 lb 8 oz (86.4 kg)  08/05/20 174 lb 8 oz (79.2 kg)    Physical Exam Vitals and nursing note reviewed.  Constitutional:      General: She is not in acute distress.    Appearance: Normal appearance. She is obese. She is not ill-appearing, toxic-appearing or diaphoretic.  HENT:     Head: Normocephalic and atraumatic.  Eyes:     Conjunctiva/sclera: Conjunctivae normal.     Pupils: Pupils are equal, round, and reactive to light.  Cardiovascular:     Rate and Rhythm: Normal rate and regular rhythm.  Pulmonary:     Effort: Pulmonary effort is normal.     Breath sounds: Normal breath sounds.  Musculoskeletal:     Cervical back: Normal range of motion and neck supple.     Right lower leg: No edema.     Left lower leg: No edema.  Skin:    General: Skin is warm and dry.     Capillary Refill: Capillary refill takes less than 2 seconds.  Neurological:     General: No focal deficit present.     Mental Status: She is alert and oriented to person, place, and time.  Psychiatric:        Mood and Affect: Mood normal.        Behavior: Behavior normal.        Thought Content: Thought content normal.        Judgment: Judgment normal.    Results for orders placed or performed in visit on 02/13/21  Urine Culture   Specimen: Urine   UR  Result Value Ref Range   Urine Culture, Routine Final report (A)    Organism ID, Bacteria Escherichia coli (A)    Antimicrobial Susceptibility Comment   Microscopic Examination   Result Value Ref Range   WBC, UA 11-30 (A) 0 - 5 /hpf   RBC 0-2 0 - 2 /hpf   Epithelial Cells (non renal) 0-10 0 - 10 /hpf   Casts None seen None seen /lpf   Bacteria, UA Few None seen/Few  RPR  Result Value Ref Range   RPR Ser Ql Non Reactive Non Reactive  HIV Antibody (routine testing w rflx)  Result Value Ref Range   HIV Screen 4th Generation wRfx Non Reactive Non Reactive  Hepatitis C antibody  Result Value Ref  Range   Hep C Virus Ab <0.1 0.0 - 0.9 s/co ratio  Urinalysis, Routine w reflex microscopic  Result Value Ref Range   Specific Gravity, UA 1.012 1.005 - 1.030   pH, UA 7.0 5.0 - 7.5   Color, UA Yellow Yellow   Appearance Ur Clear Clear   Leukocytes,UA 3+ (A) Negative   Protein,UA Negative Negative/Trace   Glucose, UA Negative Negative   Ketones, UA Negative Negative   RBC, UA Negative Negative   Bilirubin, UA Negative Negative   Urobilinogen, Ur 0.2 0.2 - 1.0 mg/dL   Nitrite, UA Negative Negative   Microscopic Examination See below:   POC Urinalysis Dipstick OB  Result Value Ref Range   Color, UA     Clarity, UA     Glucose, UA Negative Negative   Bilirubin, UA     Ketones, UA neg    Spec Grav, UA     Blood, UA trace non-hemolyzed    pH, UA     POC,PROTEIN,UA Negative Negative, Trace, Small (1+), Moderate (2+), Large (3+), 4+   Urobilinogen, UA     Nitrite, UA neg    Leukocytes, UA Moderate (2+) (A) Negative   Appearance     Odor    Cervicovaginal ancillary only( [place])  Result Value Ref Range   Neisseria Gonorrhea Negative    Chlamydia Negative    Trichomonas Negative    Bacterial Vaginitis (gardnerella) Positive (A)    Candida Vaginitis Negative    Candida Glabrata Negative    Comment      Normal Reference Range Bacterial Vaginosis - Negative   Comment Normal Reference Ranger Chlamydia - Negative    Comment      Normal Reference Range Neisseria Gonorrhea - Negative   Comment Normal Reference Range Candida Species - Negative    Comment  Normal Reference Range Candida Galbrata - Negative    Comment Normal Reference Range Trichomonas - Negative        Pertinent labs & imaging results that were available during my care of the patient were reviewed by me and considered in my medical decision making.  Assessment & Plan:  Erica [name] was seen today for weight check.  Diagnoses and all orders for this visit:  Class 1 obesity due to excess calories with serious comorbidity and body mass index (BMI) of 33.0 to 33.9 in adult Encounter for weight management Diet and exercise discussed in detail. Pt aware if unable to drop weight at next follow up, will stop medications. Pt aware to use My Fitness Pal App to help hold self accountable for diet and exercise. Follow up in 6 weeks for reevaluation.  -     phentermine (ADIPEX-P) 37.5 MG tablet; Take 1 tablet (37.5 mg total) by mouth daily before breakfast. -     phentermine 37.5 MG capsule; Take 1 capsule (37.5 mg total) by mouth every morning.     Continue all other maintenance medications.  Follow up plan: Return in about 6 weeks (around 07/25/2021), or if symptoms worsen or fail to improve, for weight management .   Continue healthy lifestyle choices, including diet (rich in fruits, vegetables, and lean proteins, and low in salt and simple carbohydrates) and exercise (at least 30 minutes of moderate physical activity daily).  Educational handout given for calorie counting for weight management  The above assessment and management plan was discussed with the patient. The patient verbalized understanding of and has agreed to the management plan. Patient is aware to call the  clinic if they develop any new symptoms or if symptoms persist or worsen. Patient is aware when to return to the clinic for a follow-up visit. Patient educated on when it is appropriate to go to the emergency department.   Monia Pouch, FNP-C [facility] [phone]

## 2021-06-13 NOTE — Patient Instructions (Signed)
My Fitness Pal

## 2021-06-14 ENCOUNTER — Telehealth: Payer: Self-pay | Admitting: *Deleted

## 2021-06-14 NOTE — Telephone Encounter (Signed)
Cap was ordered and denied -PA needed.  Tab was ordered =this was PA approved in [place]  Will close encounter

## 2021-07-28 ENCOUNTER — Other Ambulatory Visit: Payer: Self-pay | Admitting: Family Medicine

## 2021-07-28 ENCOUNTER — Telehealth: Payer: Self-pay

## 2021-07-28 DIAGNOSIS — E6609 Other obesity due to excess calories: Secondary | ICD-10-CM

## 2021-07-28 DIAGNOSIS — Z7689 Persons encountering health services in other specified circumstances: Secondary | ICD-10-CM

## 2021-07-28 NOTE — Telephone Encounter (Signed)
Patient has follow up appointment scheduled with Tiffany on 08/14/21.  Per patient she was told at appointment on 06/13/21 that we would refill again before her appointment with Tiffany.  Can we send in a refill to get her through until she sees Tiffany in a couple of weeks. ?

## 2021-07-28 NOTE — Telephone Encounter (Signed)
?  Prescription Request ? ?07/28/2021 ? ?Is this a "Controlled Substance" medicine? NO ? ?Have you seen your PCP in the last 2 weeks? NO ? ?If YES, route message to pool  -  If NO, patient needs to be scheduled for appointment. ? ?What is the name of the medication or equipment? phentermine 37.5 MG capsule ? ?Have you contacted your pharmacy to request a refill? YES  ? ?Which pharmacy would you like this sent to? [facility] [phone] - [place], [place] - [address] AT [facility] [address] (Ph: [phone]) ? ? ?Patient notified that their request is being sent to the clinical staff for review and that they should receive a response within 2 business days.  ?  ?

## 2021-07-28 NOTE — Telephone Encounter (Signed)
Spoke with patient, appointment scheduled for 08/01/21 with Kari Baars. ?

## 2021-07-31 NOTE — Telephone Encounter (Signed)
Close encounter 

## 2021-08-01 ENCOUNTER — Ambulatory Visit: Payer: Managed Care, Other (non HMO) | Admitting: Family Medicine

## 2021-08-01 ENCOUNTER — Other Ambulatory Visit: Payer: Self-pay | Admitting: Adult Health

## 2021-08-14 ENCOUNTER — Encounter: Payer: Self-pay | Admitting: Family Medicine

## 2021-08-14 ENCOUNTER — Ambulatory Visit (INDEPENDENT_AMBULATORY_CARE_PROVIDER_SITE_OTHER): Payer: Managed Care, Other (non HMO) | Admitting: Family Medicine

## 2021-08-14 VITALS — BP 127/79 | HR 104 | Temp 98.3°F | Ht 63.0 in | Wt 195.0 lb

## 2021-08-14 DIAGNOSIS — Z6834 Body mass index (BMI) 34.0-34.9, adult: Secondary | ICD-10-CM

## 2021-08-14 DIAGNOSIS — E6609 Other obesity due to excess calories: Secondary | ICD-10-CM | POA: Diagnosis not present

## 2021-08-14 DIAGNOSIS — F332 Major depressive disorder, recurrent severe without psychotic features: Secondary | ICD-10-CM

## 2021-08-14 DIAGNOSIS — E66811 Obesity, class 1: Secondary | ICD-10-CM

## 2021-08-14 MED ORDER — WEGOVY 0.25 MG/0.5ML ~~LOC~~ SOAJ: 0.2500 mg | SUBCUTANEOUS | 3 refills | Status: DC

## 2021-08-14 NOTE — Progress Notes (Signed)
? ?Established Patient Office Visit ? ?Subjective:  ?Patient ID: Erica [name], female    DOB: Jan 17, 1996  Age: 26 y.o. MRN: [id number] ? ?CC:  ?Chief Complaint  ?Patient presents with  ? Obesity  ? ? ?HPI ?Erica [name] presents for weight management.  ? ?She was taking phentermine. She did not have much success with this, although she had had some success with phentermine in the past. She hasn't had this in about 6 weeks. She had minimal weight loss while on it. She would like to discuss other options for weight management.  ? ?She is eating a well balanced diet. She is exercising 3x a week or more for about 30 minutes. She walks and does strength training for exercise.   ? ?Depression screen [facility] 2/9 06/13/2021 05/11/2021 11/22/2020  ?Decreased Interest 0 0 3  ?Down, Depressed, Hopeless 0 0 3  ?PHQ - 2 Score 0 0 6  ?Altered sleeping 0 3 3  ?Tired, decreased energy '1 1 3  ' ?Change in appetite 0 0 3  ?Feeling bad or failure about yourself  0 1 3  ?Trouble concentrating 0 0 3  ?Moving slowly or fidgety/restless 0 0 1  ?Suicidal thoughts 0 0 0  ?PHQ-9 Score '1 5 22  ' ?Difficult doing work/chores Not difficult at all Not difficult at all Very difficult  ?Some recent data might be hidden  ? ? ? ?Past Medical History:  ?Diagnosis Date  ? Anemia   ? Chlamydia 07/06/14  ? Chlamydia infection 08/02/2014  ? Chlamydia infection during pregnancy, antepartum 07/27/2014  ? Had +CHL 2/9 treated in hospital 2/19 will check POT 3/1  ? Elevated BP 07/27/2014  ? Ganglion cyst of wrist   ? left  ? Pelvic pain in female 07/27/2014  ? Pregnant 12/29/2014  ? Screening for STD (sexually transmitted disease) 07/27/2014  ? Sinus infection 12/29/2014  ? Strep throat   ? Vaginal discharge 12/29/2014  ? Yeast infection 04/07/2013  ? ? ?Past Surgical History:  ?Procedure Laterality Date  ? NO PAST SURGERIES    ? ? ?Family History  ?Problem Relation Age of Onset  ? Diabetes Maternal Grandmother   ? Diabetes Paternal Grandmother   ? Multiple sclerosis  Cousin   ? ? ?Social History  ? ?Socioeconomic History  ? Marital status: Legally Separated  ?  Spouse name: Not on file  ? Number of children: Not on file  ? Years of education: Not on file  ? Highest education level: Not on file  ?Occupational History  ? Not on file  ?Tobacco Use  ? Smoking status: Never  ? Smokeless tobacco: Never  ?Vaping Use  ? Vaping Use: Never used  ?Substance and Sexual Activity  ? Alcohol use: No  ? Drug use: No  ? Sexual activity: Yes  ?  Birth control/protection: Condom, Pill  ?Other Topics Concern  ? Not on file  ?Social History Narrative  ? Not on file  ? ?Social Determinants of Health  ? ?Financial Resource Strain: Not on file  ?Food Insecurity: Not on file  ?Transportation Needs: Not on file  ?Physical Activity: Not on file  ?Stress: Not on file  ?Social Connections: Not on file  ?Intimate Partner Violence: Not on file  ? ? ?Outpatient Medications Prior to Visit  ?Medication Sig Dispense Refill  ? BLISOVI FE 1/20 1-20 MG-MCG tablet TAKE 1 TABLET BY MOUTH DAILY 28 tablet 6  ? mupirocin ointment (BACTROBAN) 2 % Apply 1 application topically 2 (two) times daily.  22 g 0  ? phentermine (ADIPEX-P) 37.5 MG tablet Take 1 tablet (37.5 mg total) by mouth daily before breakfast. 30 tablet 0  ? penicillin v potassium (VEETID) 500 MG tablet Take by mouth. (Patient not taking: Reported on 08/14/2021)    ? fluconazole (DIFLUCAN) 150 MG tablet Take 1 now and 1 in 3 days 2 tablet 1  ? nystatin cream (MYCOSTATIN) Apply 1 application topically 2 (two) times daily. 30 g 0  ? ?No facility-administered medications prior to visit.  ? ? ?No Known Allergies ? ?ROS ?Review of Systems ?As per HPI.  ?  ?Objective:  ?  ?Physical Exam ?Vitals and nursing note reviewed.  ?Constitutional:   ?   General: She is not in acute distress. ?   Appearance: She is not ill-appearing, toxic-appearing or diaphoretic.  ?HENT:  ?   Head: Normocephalic and atraumatic.  ?Neck:  ?   Thyroid: No thyroid mass, thyromegaly or thyroid  tenderness.  ?Cardiovascular:  ?   Rate and Rhythm: Normal rate and regular rhythm.  ?   Heart sounds: Normal heart sounds. No murmur heard. ?Pulmonary:  ?   Effort: Pulmonary effort is normal. No respiratory distress.  ?   Breath sounds: Normal breath sounds.  ?Abdominal:  ?   General: Bowel sounds are normal.  ?   Palpations: Abdomen is soft.  ?Musculoskeletal:  ?   Right lower leg: No edema.  ?   Left lower leg: No edema.  ?Skin: ?   General: Skin is warm and dry.  ?Neurological:  ?   General: No focal deficit present.  ?   Mental Status: She is alert and oriented to person, place, and time.  ?Psychiatric:     ?   Mood and Affect: Mood normal.     ?   Behavior: Behavior normal.     ?   Thought Content: Thought content normal.     ?   Judgment: Judgment normal.  ? ? ?BP 127/79   Pulse (!) 104   Temp 98.3 ?F (36.8 ?C) (Temporal)   Ht '5\' 3"'  (1.6 m)   Wt 195 lb (88.5 kg)   BMI 34.54 kg/m?  ?Wt Readings from Last 3 Encounters:  ?08/14/21 195 lb (88.5 kg)  ?06/13/21 191 lb (86.6 kg)  ?05/11/21 190 lb 8 oz (86.4 kg)  ? ? ? ?There are no preventive care reminders to display for this patient. ? ?There are no preventive care reminders to display for this patient. ? ?Lab Results  ?Component Value Date  ? TSH 2.410 08/05/2020  ? ?Lab Results  ?Component Value Date  ? WBC 10.5 08/05/2020  ? HGB 14.6 08/05/2020  ? HCT 44.3 08/05/2020  ? MCV 85 08/05/2020  ? PLT 327 08/05/2020  ? ?Lab Results  ?Component Value Date  ? NA 138 08/05/2020  ? K 4.1 08/05/2020  ? CO2 20 08/05/2020  ? GLUCOSE 84 08/05/2020  ? BUN 11 08/05/2020  ? CREATININE 0.91 08/05/2020  ? BILITOT 0.3 08/05/2020  ? ALKPHOS 59 08/05/2020  ? AST 16 08/05/2020  ? ALT 13 08/05/2020  ? PROT 7.8 08/05/2020  ? ALBUMIN 4.5 08/05/2020  ? CALCIUM 9.7 08/05/2020  ? ANIONGAP 6 10/11/2014  ? EGFR 89 08/05/2020  ? ?Lab Results  ?Component Value Date  ? CHOL 217 (H) 08/05/2020  ? ?Lab Results  ?Component Value Date  ? HDL 61 08/05/2020  ? ?Lab Results  ?Component Value Date   ? LDLCALC 137 (H) 08/05/2020  ? ?Lab Results  ?  Component Value Date  ? TRIG 105 08/05/2020  ? ?Lab Results  ?Component Value Date  ? CHOLHDL 3.6 08/05/2020  ? ?Lab Results  ?Component Value Date  ? HGBA1C 5.4 06/04/2018  ? ? ?  ?Assessment & Plan:  ? ?Jini was seen today for obesity. ? ?Diagnoses and all orders for this visit: ? ?Class 1 obesity due to excess calories with serious comorbidity and body mass index (BMI) of 34.0 to 34.9 in adult ?Minimal results with phentermine, diet, and exercise. Will start [facility] as below. If no approved by insurance, will try saxenda. Discussed phentermine/topamax as last result if above are not approved.  ?-     Semaglutide-Weight Management (WEGOVY) 0.25 MG/0.5ML SOAJ; Inject 0.25 mg into the skin once a week. Inject 0.25 mg into the skin once a week for weeks 1-4, then increase to 0.5 mg weekly for weeks 5-8, then increase to 1 mg weekly for weeks 9-12, then increase to 1.7 mg weekly for weeks 13-16. ? ?Major depressive disorder, recurrent severe without psychotic features ([place]) ?Well controlled without medication.  ? ? ?Follow-up: Return in about 4 weeks (around 09/11/2021) for weight managment.  ? ?The patient indicates understanding of these issues and agrees with the plan. ? ?Gwenlyn Perking, FNP ?

## 2021-08-14 NOTE — Patient Instructions (Signed)
Semaglutide Injection (Weight Management) ?What is this medication? ?SEMAGLUTIDE (SEM a GLOO tide) promotes weight loss. It may also be used to maintain weight loss. It works by decreasing appetite. Changes to diet and exercise are often combined with this medication. ?This medicine may be used for other purposes; ask your health care provider or pharmacist if you have questions. ?COMMON BRAND NAME(S): Wegovy ?What should I tell my care team before I take this medication? ?They need to know if you have any of these conditions: ?Endocrine tumors (MEN 2) or if someone in your family had these tumors ?Eye disease, vision problems ?Gallbladder disease ?History of depression or mental health disease ?History of pancreatitis ?Kidney disease ?Stomach or intestine problems ?Suicidal thoughts, plans, or attempt; a previous suicide attempt by you or a family member ?Thyroid cancer or if someone in your family had thyroid cancer ?An unusual or allergic reaction to semaglutide, other medications, foods, dyes, or preservatives ?Pregnant or trying to get pregnant ?Breast-feeding ?How should I use this medication? ?This medication is injected under the skin. You will be taught how to prepare and give it. Take it as directed on the prescription label. It is given once every week (every 7 days). Keep taking it unless your care team tells you to stop. ?It is important that you put your used needles and pens in a special sharps container. Do not put them in a trash can. If you do not have a sharps container, call your pharmacist or care team to get one. ?A special MedGuide will be given to you by the pharmacist with each prescription and refill. Be sure to read this information carefully each time. ?This medication comes with INSTRUCTIONS FOR USE. Ask your pharmacist for directions on how to use this medication. Read the information carefully. Talk to your pharmacist or care team if you have questions. ?Talk to your care team about  the use of this medication in children. Special care may be needed. ?Overdosage: If you think you have taken too much of this medicine contact a poison control center or emergency room at once. ?NOTE: This medicine is only for you. Do not share this medicine with others. ?What if I miss a dose? ?If you miss a dose and the next scheduled dose is more than 2 days away, take the missed dose as soon as possible. If you miss a dose and the next scheduled dose is less than 2 days away, do not take the missed dose. Take the next dose at your regular time. Do not take double or extra doses. If you miss your dose for 2 weeks or more, take the next dose at your regular time or call your care team to talk about how to restart this medication. ?What may interact with this medication? ?Insulin and other medications for diabetes ?This list may not describe all possible interactions. Give your health care provider a list of all the medicines, herbs, non-prescription drugs, or dietary supplements you use. Also tell them if you smoke, drink alcohol, or use illegal drugs. Some items may interact with your medicine. ?What should I watch for while using this medication? ?Visit your care team for regular checks on your progress. It may be some time before you see the benefit from this medication. ?Drink plenty of fluids while taking this medication. Check with your care team if you have severe diarrhea, nausea, and vomiting, or if you sweat a lot. The loss of too much body fluid may make it dangerous for   you to take this medication. ?This medication may affect blood sugar levels. Ask your care team if changes in diet or medications are needed if you have diabetes. ?If you or your family notice any changes in your behavior, such as new or worsening depression, thoughts of harming yourself, anxiety, other unusual or disturbing thoughts, or memory loss, call your care team right away. ?Women should inform their care team if they wish to  become pregnant or think they might be pregnant. Losing weight while pregnant is not advised and may cause harm to the unborn child. Talk to your care team for more information. ?What side effects may I notice from receiving this medication? ?Side effects that you should report to your care team as soon as possible: ?Allergic reactions--skin rash, itching, hives, swelling of the face, lips, tongue, or throat ?Change in vision ?Dehydration--increased thirst, dry mouth, feeling faint or lightheaded, headache, dark yellow or brown urine ?Gallbladder problems--severe stomach pain, nausea, vomiting, fever ?Heart palpitations--rapid, pounding, or irregular heartbeat ?Kidney injury--decrease in the amount of urine, swelling of the ankles, hands, or feet ?Pancreatitis--severe stomach pain that spreads to your back or gets worse after eating or when touched, fever, nausea, vomiting ?Thoughts of suicide or self-harm, worsening mood, feelings of depression ?Thyroid cancer--new mass or lump in the neck, pain or trouble swallowing, trouble breathing, hoarseness ?Side effects that usually do not require medical attention (report to your care team if they continue or are bothersome): ?Diarrhea ?Loss of appetite ?Nausea ?Stomach pain ?Vomiting ?This list may not describe all possible side effects. Call your doctor for medical advice about side effects. You may report side effects to FDA at 1-800-FDA-1088. ?Where should I keep my medication? ?Keep out of the reach of children and pets. ?Refrigeration (preferred): Store in the refrigerator. Do not freeze. Keep this medication in the original container until you are ready to take it. Get rid of any unused medication after the expiration date. ?Room temperature: If needed, prior to cap removal, the pen can be stored at room temperature for up to 28 days. Protect from light. If it is stored at room temperature, get rid of any unused medication after 28 days or after it expires,  whichever is first. ?It is important to get rid of the medication as soon as you no longer need it or it is expired. You can do this in two ways: ?Take the medication to a medication take-back program. Check with your pharmacy or law enforcement to find a location. ?If you cannot return the medication, follow the directions in the MedGuide. ?NOTE: This sheet is a summary. It may not cover all possible information. If you have questions about this medicine, talk to your doctor, pharmacist, or health care provider. ?? 2022 Elsevier/Gold Standard (2020-08-19 00:00:00) ? ?

## 2021-08-15 ENCOUNTER — Encounter: Payer: Self-pay | Admitting: Family Medicine

## 2021-08-16 NOTE — Telephone Encounter (Signed)
Pt called and confirmed that she has already been in touch with someone from her insurance about needing a PA for the [facility] Rx and was told by insurance company that they have already sent [place] documentation that PA is needed. ? ?Explained to pt that we will be working on the PA and once we hear from the insurance whether is was denied or approved, we will call to let patient know. Let pt know that this process could take 3-5 business days depending on insurance company. Pt voiced understanding.  ?

## 2021-08-18 ENCOUNTER — Encounter: Payer: Self-pay | Admitting: Family Medicine

## 2021-08-18 NOTE — Telephone Encounter (Signed)
Called and spoke with patient aware PA for wegovy is not done yet  ?

## 2021-08-21 ENCOUNTER — Telehealth: Payer: Self-pay | Admitting: *Deleted

## 2021-08-21 NOTE — Telephone Encounter (Signed)
[employer] APPROVED ? ?[id number]. WEGOVY INJ 0.25MG  is approved through 03/23/2022. ? ? ?Pharmacy and pt aware of approval. ? ?

## 2021-08-21 NOTE — Telephone Encounter (Signed)
PA for [facility] Process ? ?Key: [id number] ? ?[id number] ? ? ?[employer] is reviewing your PA request. Typically an electronic response will be received within 24-72 hours. To check for an update later, open this request from your dashboard. ?

## 2021-08-29 ENCOUNTER — Telehealth: Payer: Self-pay | Admitting: Family Medicine

## 2021-08-29 NOTE — Telephone Encounter (Signed)
Pt started her first dose of [facility] 08/21/21 so her 4 week follow up should be around 09/18/21 but she starts a new position at work 09/08/21 and the training is 30 days. Should she come in next week for follow up or can she do a virtual visit after being on med for 4 weeks and continue to monitor weight at home to increase the dose? She would not be able to come in for visit till after 10/09/21. Please advise. ?

## 2021-08-29 NOTE — Telephone Encounter (Signed)
Patient is supposed to come in for appt for a recheck for [facility] around April 17th but she said that she starts a new job on April 14th and wants to know if she could come in before the 14th. Please call back.  ?

## 2021-08-30 ENCOUNTER — Other Ambulatory Visit (HOSPITAL_COMMUNITY)
Admission: RE | Admit: 2021-08-30 | Discharge: 2021-08-30 | Disposition: A | Discharge status: Home / Self Care | Payer: Managed Care, Other (non HMO) | Source: Ambulatory Visit | Attending: Obstetrics & Gynecology | Admitting: Obstetrics & Gynecology

## 2021-08-30 ENCOUNTER — Other Ambulatory Visit (INDEPENDENT_AMBULATORY_CARE_PROVIDER_SITE_OTHER): Payer: Managed Care, Other (non HMO) | Admitting: *Deleted

## 2021-08-30 DIAGNOSIS — N898 Other specified noninflammatory disorders of vagina: Secondary | ICD-10-CM

## 2021-08-30 DIAGNOSIS — Z113 Encounter for screening for infections with a predominantly sexual mode of transmission: Secondary | ICD-10-CM | POA: Diagnosis not present

## 2021-08-30 NOTE — Telephone Encounter (Signed)
Patient aware and appointment scheduled.  

## 2021-08-30 NOTE — Progress Notes (Signed)
? ?  NURSE VISIT- VAGINITIS/STD/POC ? ?SUBJECTIVE:  ?Erica [name] is a 26 y.o. [phone] GYN patientfemale here for a vaginal swab for vaginitis screening, STD screen.  She reports the following symptoms: discharge described as white and odor for a few  days. ?Denies abnormal vaginal bleeding, significant pelvic pain, fever, or UTI symptoms. ? ?OBJECTIVE:  ?There were no vitals taken for this visit.  ?Appears well, in no apparent distress ? ?ASSESSMENT: ?Vaginal swab for STD screen ? ?PLAN: ?Self-collected vaginal probe for Gonorrhea, Chlamydia, Trichomonas, Bacterial Vaginosis, Yeast sent to lab ?Treatment: to be determined once results are received ?Follow-up as needed if symptoms persist/worsen, or new symptoms develop ? ?Annamarie Dawley  ?08/30/2021 ?9:56 AM  ?

## 2021-08-30 NOTE — Telephone Encounter (Signed)
Virtual is ok for the next visit.  ?

## 2021-08-31 ENCOUNTER — Other Ambulatory Visit: Payer: Self-pay | Admitting: Adult Health

## 2021-08-31 LAB — CERVICOVAGINAL ANCILLARY ONLY
Bacterial Vaginitis (gardnerella): POSITIVE — AB
Candida Glabrata: NEGATIVE
Candida Vaginitis: NEGATIVE
Chlamydia: NEGATIVE
Comment: NEGATIVE
Comment: NEGATIVE
Comment: NEGATIVE
Comment: NEGATIVE
Comment: NEGATIVE
Comment: NORMAL
Neisseria Gonorrhea: NEGATIVE
Trichomonas: NEGATIVE

## 2021-08-31 MED ORDER — METRONIDAZOLE 500 MG PO TABS: 500.0000 mg | ORAL_TABLET | Freq: Two times a day (BID) | ORAL | 0 refills | Status: DC

## 2021-08-31 NOTE — Progress Notes (Signed)
Vaginal swab +BV ?Will rx flagyl, no sex or alcohol while taking ?

## 2021-09-14 ENCOUNTER — Encounter: Payer: Self-pay | Admitting: Family Medicine

## 2021-09-14 ENCOUNTER — Telehealth (INDEPENDENT_AMBULATORY_CARE_PROVIDER_SITE_OTHER): Payer: Managed Care, Other (non HMO) | Admitting: Family Medicine

## 2021-09-14 VITALS — Wt 189.7 lb

## 2021-09-14 DIAGNOSIS — E6609 Other obesity due to excess calories: Secondary | ICD-10-CM

## 2021-09-14 DIAGNOSIS — Z6834 Body mass index (BMI) 34.0-34.9, adult: Secondary | ICD-10-CM | POA: Diagnosis not present

## 2021-09-14 DIAGNOSIS — E66811 Obesity, class 1: Secondary | ICD-10-CM

## 2021-09-14 MED ORDER — SEMAGLUTIDE-WEIGHT MANAGEMENT 2.4 MG/0.75ML ~~LOC~~ SOAJ: 2.4000 mg | SUBCUTANEOUS | 0 refills | Status: AC

## 2021-09-14 MED ORDER — SEMAGLUTIDE-WEIGHT MANAGEMENT 0.5 MG/0.5ML ~~LOC~~ SOAJ: 0.5000 mg | SUBCUTANEOUS | 0 refills | Status: DC

## 2021-09-14 MED ORDER — SEMAGLUTIDE-WEIGHT MANAGEMENT 1.7 MG/0.75ML ~~LOC~~ SOAJ: 1.7000 mg | SUBCUTANEOUS | 0 refills | Status: AC

## 2021-09-14 MED ORDER — SEMAGLUTIDE-WEIGHT MANAGEMENT 1 MG/0.5ML ~~LOC~~ SOAJ: 1.0000 mg | SUBCUTANEOUS | 0 refills | Status: AC

## 2021-09-14 NOTE — Progress Notes (Signed)
? ?Virtual Visit via video Note ? ? ?Due to COVID-19 pandemic this visit was conducted virtually. This visit type was conducted due to national recommendations for restrictions regarding the COVID-19 Pandemic (e.g. social distancing, sheltering in place) in an effort to limit this patient's exposure and mitigate transmission in our community. All issues noted in this document were discussed and addressed.  A physical exam was not performed with this format. ? ?I connected with  Erica [name]  on 09/14/21 at 1303 by video and verified that I am speaking with the correct person using two identifiers. Erica [name] is currently located at home and no one is currently with her during the visit. The provider, Gabriel Earing, FNP is located in their office at time of visit. ? ?I discussed the limitations, risks, security and privacy concerns of performing an evaluation and management service by video  and the availability of in person appointments. I also discussed with the patient that there may be a patient responsible charge related to this service. The patient expressed understanding and agreed to proceed. ? ?CC: weight management ? ?History and Present Illness: ?Erica [name] started on Wegovy 4 weeks ago. She reports that she has been doing well on this so far. She has had some occasional nausea but otherwise denies side effects. She just had her 4th injection at 0.25 mg. Over the last 1.5 weeks she has noticed a decrease in her appetite and has felt fuller faster. She has went from eating 3 hard boiled eggs, 2 pieces of toast, and 2 slices of bacon down to 1 hard boiled egg and fruit. She has lost 6 lbs. She has been working out at home and then going to the gym once a week when she has time.  ? ? ?ROS ?As per HPI.  ? ? ?Observations/Objective: ?Alert and oriented x 3. Able to speak in full sentences without difficulty. Mood and behavior is appropriate. Respirations appear unlabored.  ? ? ?Assessment  and Plan: ?Kaidynce was seen today for obesity. ? ?Diagnoses and all orders for this visit: ? ?Class 1 obesity due to excess calories with serious comorbidity and body mass index (BMI) of 34.0 to 34.9 in adult ?Titrate wegovy every 4 weeks, starting at 0.5 mg with next injection. Tolerating well with 6 lbs lost since starting on the 0.25 mg dosage 4 weeks ago. Continue diet and exercise.  ?-     Semaglutide-Weight Management 0.5 MG/0.5ML SOAJ; Inject 0.5 mg into the skin once a week for 28 days. ?-     Semaglutide-Weight Management 1.7 MG/0.75ML SOAJ; Inject 1.7 mg into the skin once a week for 28 days. ?-     Semaglutide-Weight Management 2.4 MG/0.75ML SOAJ; Inject 2.4 mg into the skin once a week for 28 days. ?-     Semaglutide-Weight Management 1 MG/0.5ML SOAJ; Inject 1 mg into the skin once a week for 28 days. ? ? ? ? ?Follow Up Instructions: ?Follow up in 4 weeks for obesity.  ? ?  ?I discussed the assessment and treatment plan with the patient. The patient was provided an opportunity to ask questions and all were answered. The patient agreed with the plan and demonstrated an understanding of the instructions. ?  ?The patient was advised to call back or seek an in-person evaluation if the symptoms worsen or if the condition fails to improve as anticipated. ? ?The above assessment and management plan was discussed with the patient. The patient verbalized understanding of and has agreed  to the management plan. Patient is aware to call the clinic if symptoms persist or worsen. Patient is aware when to return to the clinic for a follow-up visit. Patient educated on when it is appropriate to go to the emergency department.  ? ?Time call ended: 1317 ? ?I provided 14 minutes of face-to-face time during this encounter. ? ? ? ?Gabriel Earing, FNP ? ? ?

## 2021-10-13 ENCOUNTER — Ambulatory Visit: Payer: Managed Care, Other (non HMO) | Admitting: Family Medicine

## 2021-10-16 ENCOUNTER — Ambulatory Visit (INDEPENDENT_AMBULATORY_CARE_PROVIDER_SITE_OTHER): Payer: Managed Care, Other (non HMO) | Admitting: Family Medicine

## 2021-10-16 ENCOUNTER — Other Ambulatory Visit: Payer: Self-pay | Admitting: Family Medicine

## 2021-10-16 ENCOUNTER — Encounter: Payer: Self-pay | Admitting: Family Medicine

## 2021-10-16 VITALS — BP 113/77 | HR 81 | Temp 97.8°F | Ht 63.0 in | Wt 186.5 lb

## 2021-10-16 DIAGNOSIS — E6609 Other obesity due to excess calories: Secondary | ICD-10-CM

## 2021-10-16 DIAGNOSIS — R110 Nausea: Secondary | ICD-10-CM | POA: Diagnosis not present

## 2021-10-16 DIAGNOSIS — F332 Major depressive disorder, recurrent severe without psychotic features: Secondary | ICD-10-CM

## 2021-10-16 DIAGNOSIS — Z6834 Body mass index (BMI) 34.0-34.9, adult: Secondary | ICD-10-CM | POA: Diagnosis not present

## 2021-10-16 DIAGNOSIS — E66811 Obesity, class 1: Secondary | ICD-10-CM

## 2021-10-16 MED ORDER — FAMOTIDINE 20 MG PO TABS: 20.0000 mg | ORAL_TABLET | Freq: Two times a day (BID) | ORAL | 1 refills | Status: DC | PRN

## 2021-10-16 MED ORDER — SEMAGLUTIDE-WEIGHT MANAGEMENT 0.5 MG/0.5ML ~~LOC~~ SOAJ: 0.5000 mg | SUBCUTANEOUS | 0 refills | Status: DC

## 2021-10-16 NOTE — Progress Notes (Signed)
Established Patient Office Visit  Subjective   Patient ID: Erica [name], female    DOB: 04/07/1996  Age: 26 y.o. MRN: [id number]  Chief Complaint  Patient presents with   Obesity    HPI Shaylea is here for weight management. She has been on Wegovy 0.5 mg for the last 4 weeks. She has noticed fatigue for the first 2 days after her injection and nausea with eating for the first few days. She denies other side effects. She has been maintaining a well balanced diet and is eating smaller portions. Reports decreased appetite. She is walking 3x a week and is doing exercises on the weekends when able. She has lost 3 lbs over the last month.      10/16/2021    4:24 PM 06/13/2021    8:54 AM 05/11/2021    4:25 PM  Depression screen PHQ 2/9  Decreased Interest 0 0 0  Down, Depressed, Hopeless 0 0 0  PHQ - 2 Score 0 0 0  Altered sleeping 3 0 3  Tired, decreased energy '3 1 1  ' Change in appetite 0 0 0  Feeling bad or failure about yourself  0 0 1  Trouble concentrating 0 0 0  Moving slowly or fidgety/restless 0 0 0  Suicidal thoughts 0 0 0  PHQ-9 Score '6 1 5  ' Difficult doing work/chores Somewhat difficult Not difficult at all Not difficult at all      10/16/2021    4:25 PM 06/13/2021    8:54 AM 05/11/2021    4:25 PM 08/10/2020    8:18 AM  GAD 7 : Generalized Anxiety Score  Nervous, Anxious, on Edge 0 0 0 0  Control/stop worrying 0 0 0 1  Worry too much - different things 0 0 1 1  Trouble relaxing 0 0 1 3  Restless 0 0 0 1  Easily annoyed or irritable 0 0 0 1  Afraid - awful might happen 0 0 1 0  Total GAD 7 Score 0 0 3 7  Anxiety Difficulty Not difficult at all Not difficult at all Not difficult at all Somewhat difficult     Patient Active Problem List   Diagnosis Date Noted   Yeast infection 09/25/2019   Vaginal discharge 09/25/2019   Encounter for gynecological examination with Papanicolaou smear of cervix 12/16/2018   Family planning 12/16/2018   Screening  examination for STD (sexually transmitted disease) 12/16/2018   Irregular bleeding 12/16/2018   Nexplanon in place 12/16/2018   Encounter for initial prescription of contraceptive pills 12/16/2018   Postpartum depression 12/31/2017   Nexplanon insertion 02/05/2017   Monilial vulvovaginitis 05/05/2015   BV (bacterial vaginosis) 05/05/2015   Sinus infection 12/29/2014   Chlamydia 07/27/2014   Sickle cell trait ([place]) 03/17/2013      ROS Negative unless specially indicated above in HPI.   Objective:     BP 113/77   Pulse 81   Temp 97.8 F (36.6 C) (Temporal)   Ht '5\' 3"'  (1.6 m)   Wt 186 lb 8 oz (84.6 kg)   BMI 33.04 kg/m  BP Readings from Last 3 Encounters:  10/16/21 113/77  08/14/21 127/79  06/13/21 121/86      Physical Exam Vitals and nursing note reviewed.  Constitutional:      General: She is not in acute distress.    Appearance: She is not ill-appearing, toxic-appearing or diaphoretic.  HENT:     Head: Normocephalic and atraumatic.  Cardiovascular:     Rate and  Rhythm: Normal rate and regular rhythm.     Heart sounds: Normal heart sounds. No murmur heard. Pulmonary:     Effort: Pulmonary effort is normal. No respiratory distress.     Breath sounds: Normal breath sounds.  Abdominal:     General: Bowel sounds are normal. There is no distension.     Palpations: Abdomen is soft.     Tenderness: There is no abdominal tenderness. There is no guarding or rebound.  Musculoskeletal:     Right lower leg: No edema.     Left lower leg: No edema.  Skin:    General: Skin is warm and dry.  Neurological:     General: No focal deficit present.     Mental Status: She is alert and oriented to person, place, and time.  Psychiatric:        Mood and Affect: Mood normal.        Behavior: Behavior normal.        Thought Content: Thought content normal.        Judgment: Judgment normal.     No results found for any visits on 10/16/21.  Last CBC Lab Results  Component  Value Date   WBC 10.5 08/05/2020   HGB 14.6 08/05/2020   HCT 44.3 08/05/2020   MCV 85 08/05/2020   MCH 28.0 08/05/2020   RDW 12.6 08/05/2020   PLT 327 13/12/6576   Last metabolic panel Lab Results  Component Value Date   GLUCOSE 84 08/05/2020   NA 138 08/05/2020   K 4.1 08/05/2020   CL 101 08/05/2020   CO2 20 08/05/2020   BUN 11 08/05/2020   CREATININE 0.91 08/05/2020   EGFR 89 08/05/2020   CALCIUM 9.7 08/05/2020   PROT 7.8 08/05/2020   ALBUMIN 4.5 08/05/2020   LABGLOB 3.3 08/05/2020   AGRATIO 1.4 08/05/2020   BILITOT 0.3 08/05/2020   ALKPHOS 59 08/05/2020   AST 16 08/05/2020   ALT 13 08/05/2020   ANIONGAP 6 10/11/2014   Last lipids Lab Results  Component Value Date   CHOL 217 (H) 08/05/2020   HDL 61 08/05/2020   LDLCALC 137 (H) 08/05/2020   TRIG 105 08/05/2020   CHOLHDL 3.6 08/05/2020   Last hemoglobin A1c Lab Results  Component Value Date   HGBA1C 5.4 06/04/2018      The ASCVD Risk score (Arnett DK, et al., 2019) failed to calculate for the following reasons:   The 2019 ASCVD risk score is only valid for ages 17 to 49    Assessment & Plan:   Glema was seen today for obesity.  Diagnoses and all orders for this visit:  Class 1 obesity due to excess calories with serious comorbidity and body mass index (BMI) of 34.0 to 34.9 in adult Down 3 lbs since last visit. Increase to 1 mg weekly. 1 mg is currently on backorder at pharmacy. Instructed to 2 injections at the same time of the 0.5 mg dosage until 1 mg pen is available. If side effects are not tolerable, titrate back down to 0.5 mg weekly. Continue diet and exercise. Will check labs at next appt.  -     Semaglutide-Weight Management 0.5 MG/0.5ML SOAJ; Inject 0.5 mg into the skin once a week.  Nausea Side effect of wegovy. Try pepcid as below.  -     famotidine (PEPCID) 20 MG tablet; Take 1 tablet (20 mg total) by mouth 2 (two) times daily as needed for heartburn or indigestion (nausea).  Major  depressive disorder,  recurrent severe without psychotic features ([place]) PHQ score of 6 today due to report of altered sleeping and fatigue. Discussed fatigue as side effect of wegovy. Otherwise depression is well controlled.   Return in about 4 weeks (around 11/13/2021) for weight follow up.   The patient indicates understanding of these issues and agrees with the plan.  Gwenlyn Perking, FNP

## 2021-10-16 NOTE — Patient Instructions (Signed)
Give 2  of the 0.5 mg injections at the same time each week for a total of 1 mg weekly for the next 4 weeks.   If 1 mg dosage pens are available, you can do 1 a week of these instead for the next 4 weeks.   Exercising to Lose Weight Getting regular exercise is important for everyone. It is especially important if you are overweight. Being overweight increases your risk of heart disease, stroke, diabetes, high blood pressure, and several types of cancer. Exercising, and reducing the calories you consume, can help you lose weight and improve fitness and health. Exercise can be moderate or vigorous intensity. To lose weight, most people need to do a certain amount of moderate or vigorous-intensity exercise each week. How can exercise affect me? You lose weight when you exercise enough to burn more calories than you eat. Exercise also reduces body fat and builds muscle. The more muscle you have, the more calories you burn. Exercise also: Improves mood. Reduces stress and tension. Improves your overall fitness, flexibility, and endurance. Increases bone strength. Moderate-intensity exercise  Moderate-intensity exercise is any activity that gets you moving enough to burn at least three times more energy (calories) than if you were sitting. Examples of moderate exercise include: Walking a mile in 15 minutes. Doing light yard work. Biking at an easy pace. Most people should get at least 150 minutes of moderate-intensity exercise a week to maintain their body weight. Vigorous-intensity exercise Vigorous-intensity exercise is any activity that gets you moving enough to burn at least six times more calories than if you were sitting. When you exercise at this intensity, you should be working hard enough that you are not able to carry on a conversation. Examples of vigorous exercise include: Running. Playing a team sport, such as football, basketball, and soccer. Jumping rope. Most people should get at  least 75 minutes a week of vigorous exercise to maintain their body weight. What actions can I take to lose weight? The amount of exercise you need to lose weight depends on: Your age. The type of exercise. Any health conditions you have. Your overall physical ability. Talk to your health care provider about how much exercise you need and what types of activities are safe for you. Nutrition  Make changes to your diet as told by your health care provider or diet and nutrition specialist (dietitian). This may include: Eating fewer calories. Eating more protein. Eating less unhealthy fats. Eating a diet that includes fresh fruits and vegetables, whole grains, low-fat dairy products, and lean protein. Avoiding foods with added fat, salt, and sugar. Drink plenty of water while you exercise to prevent dehydration or heat stroke. Activity Choose an activity that you enjoy and set realistic goals. Your health care provider can help you make an exercise plan that works for you. Exercise at a moderate or vigorous intensity most days of the week. The intensity of exercise may vary from person to person. You can tell how intense a workout is for you by paying attention to your breathing and heartbeat. Most people will notice their breathing and heartbeat get faster with more intense exercise. Do resistance training twice each week, such as: Push-ups. Sit-ups. Lifting weights. Using resistance bands. Getting short amounts of exercise can be just as helpful as long, structured periods of exercise. If you have trouble finding time to exercise, try doing these things as part of your daily routine: Get up, stretch, and walk around every 30 minutes throughout the  day. Go for a walk during your lunch break. Park your car farther away from your destination. If you take public transportation, get off one stop early and walk the rest of the way. Make phone calls while standing up and walking around. Take  the stairs instead of elevators or escalators. Wear comfortable clothes and shoes with good support. Do not exercise so much that you hurt yourself, feel dizzy, or get very short of breath. Where to find more information U.S. Department of Health and Human Services: ThisPath.fi Centers for Disease Control and Prevention: FootballExhibition.com.br Contact a health care provider: Before starting a new exercise program. If you have questions or concerns about your weight. If you have a medical problem that keeps you from exercising. Get help right away if: You have any of the following while exercising: Injury. Dizziness. Difficulty breathing or shortness of breath that does not go away when you stop exercising. Chest pain. Rapid heartbeat. These symptoms may represent a serious problem that is an emergency. Do not wait to see if the symptoms will go away. Get medical help right away. Call your local emergency services (911 in the [place] Do not drive yourself to the hospital. Summary Getting regular exercise is especially important if you are overweight. Being overweight increases your risk of heart disease, stroke, diabetes, high blood pressure, and several types of cancer. Losing weight happens when you burn more calories than you eat. Reducing the amount of calories you eat, and getting regular moderate or vigorous exercise each week, helps you lose weight. This information is not intended to replace advice given to you by your health care provider. Make sure you discuss any questions you have with your health care provider. Document Revised: 07/10/2020 Document Reviewed: 07/10/2020 Elsevier Patient Education  2023 [employer].

## 2021-10-18 ENCOUNTER — Telehealth: Payer: Self-pay | Admitting: Family Medicine

## 2021-10-18 NOTE — Telephone Encounter (Signed)
Unfortunately she will just have to wait until the medication is available. She can resume at 1 mg when it is available.

## 2021-10-18 NOTE — Telephone Encounter (Signed)
Pt called to let PCP know that currently all the pharmacies she has called, are out of stock of Wegovy so she cant get her medicine refilled. Says her pharmacy wont have it until the 2nd wk of June. Needs to know what PCP wants to do.

## 2021-10-18 NOTE — Telephone Encounter (Signed)
Please advise 

## 2021-10-18 NOTE — Telephone Encounter (Signed)
Pt aware of provider feedback and voiced understanding. 

## 2021-10-30 ENCOUNTER — Other Ambulatory Visit: Payer: Self-pay | Admitting: Family Medicine

## 2021-10-30 DIAGNOSIS — E6609 Other obesity due to excess calories: Secondary | ICD-10-CM

## 2021-10-30 MED ORDER — PHENTERMINE HCL 37.5 MG PO CAPS: 37.5000 mg | ORAL_CAPSULE | ORAL | 0 refills | Status: DC

## 2021-10-30 NOTE — Telephone Encounter (Signed)
Pt says that all pharmacies are still out of wegovy and wants to go back on what she was taking before. It is rx for phentermine. Use [facility] in [place]. Please call back

## 2021-10-30 NOTE — Telephone Encounter (Signed)
I sent in a 30 day supply of phentermine without a visit this time since she was just seen 2 weeks ago. She needs to follow up in the office in 4 weeks.

## 2021-10-30 NOTE — Telephone Encounter (Signed)
Patient aware and verbalizes understanding. 

## 2021-12-01 ENCOUNTER — Encounter: Payer: Self-pay | Admitting: Family Medicine

## 2021-12-01 ENCOUNTER — Ambulatory Visit (INDEPENDENT_AMBULATORY_CARE_PROVIDER_SITE_OTHER): Payer: Managed Care, Other (non HMO) | Admitting: Family Medicine

## 2021-12-01 VITALS — BP 116/84 | HR 105 | Temp 98.4°F | Ht 63.0 in | Wt 189.4 lb

## 2021-12-01 DIAGNOSIS — E6609 Other obesity due to excess calories: Secondary | ICD-10-CM | POA: Diagnosis not present

## 2021-12-01 DIAGNOSIS — Z6833 Body mass index (BMI) 33.0-33.9, adult: Secondary | ICD-10-CM | POA: Diagnosis not present

## 2021-12-01 MED ORDER — PHENTERMINE HCL 37.5 MG PO CAPS: 37.5000 mg | ORAL_CAPSULE | ORAL | 0 refills | Status: DC

## 2021-12-01 NOTE — Progress Notes (Signed)
Established Patient Office Visit  Subjective   Patient ID: Erica [name], female    DOB: 07/14/1995  Age: 26 y.o. MRN: [id number]  Chief Complaint  Patient presents with   Obesity    HPI Erica [name] is here for weight management follow up. She has been on phentermine for about 1 month. She has gained 3 lbs. Denies side effects of phentermine. She reports that she has been doing really well with her diet. She has been walking 1 mile a day and has been going to the gym twice a week. She did well on Wegovy a few months ago but was unable to get a refill due to the shortages of this. She tried quite a few pharmacies but no one had it in stock.   Past Medical History:  Diagnosis Date   Anemia    Chlamydia 07/06/14   Chlamydia infection 08/02/2014   Chlamydia infection during pregnancy, antepartum 07/27/2014   Had +CHL 2/9 treated in hospital 2/19 will check POT 3/1   Elevated BP 07/27/2014   Ganglion cyst of wrist    left   Pelvic pain in female 07/27/2014   Pregnant 12/29/2014   Screening for STD (sexually transmitted disease) 07/27/2014   Sinus infection 12/29/2014   Strep throat    Vaginal discharge 12/29/2014   Yeast infection 04/07/2013      ROS As per HPI.   Objective:     BP 116/84   Pulse (!) 105   Temp 98.4 F (36.9 C) (Temporal)   Ht 5\' 3"  (1.6 m)   Wt 189 lb 6 oz (85.9 kg)   SpO2 99%   BMI 33.55 kg/m    Physical Exam Vitals and nursing note reviewed.  Constitutional:      General: She is not in acute distress.    Appearance: She is not ill-appearing, toxic-appearing or diaphoretic.  Cardiovascular:     Rate and Rhythm: Normal rate and regular rhythm.     Heart sounds: Normal heart sounds. No murmur heard. Pulmonary:     Effort: Pulmonary effort is normal. No respiratory distress.     Breath sounds: Normal breath sounds.  Musculoskeletal:     Right lower leg: No edema.     Left lower leg: No edema.  Skin:    General: Skin is warm and dry.  Neurological:      General: No focal deficit present.     Mental Status: She is alert and oriented to person, place, and time.  Psychiatric:        Mood and Affect: Mood normal.        Behavior: Behavior normal.        Thought Content: Thought content normal.        Judgment: Judgment normal.      No results found for any visits on 12/01/21.    The ASCVD Risk score (Arnett DK, et al., 2019) failed to calculate for the following reasons:   The 2019 ASCVD risk score is only valid for ages 34 to 60    Assessment & Plan:   Erica [name] was seen today for obesity.  Diagnoses and all orders for this visit:  Class 1 obesity due to excess calories with serious comorbidity and body mass index (BMI) of 33.0 to 33.9 in adult Up 3 lbs since last visit. PDMP reviewed, no red flags. Will try phentermine for an additional month. Discussed that will I not refill further if weight is not down at next visit. Discussed can resend  [name] if she finds a pharmacy with the starting doses in stock. -     phentermine 37.5 MG capsule; Take 1 capsule (37.5 mg total) by mouth every morning.  Return in about 4 weeks (around 12/29/2021) for weight.  The patient indicates understanding of these issues and agrees with the plan.    Gabriel Earing, FNP

## 2022-01-23 ENCOUNTER — Other Ambulatory Visit (HOSPITAL_COMMUNITY)
Admission: RE | Admit: 2022-01-23 | Discharge: 2022-01-23 | Disposition: A | Discharge status: Home / Self Care | Payer: Managed Care, Other (non HMO) | Source: Ambulatory Visit | Attending: Obstetrics & Gynecology | Admitting: Obstetrics & Gynecology

## 2022-01-23 ENCOUNTER — Other Ambulatory Visit (INDEPENDENT_AMBULATORY_CARE_PROVIDER_SITE_OTHER): Payer: Managed Care, Other (non HMO) | Admitting: *Deleted

## 2022-01-23 ENCOUNTER — Other Ambulatory Visit: Payer: Self-pay | Admitting: Adult Health

## 2022-01-23 DIAGNOSIS — R300 Dysuria: Secondary | ICD-10-CM

## 2022-01-23 DIAGNOSIS — N760 Acute vaginitis: Secondary | ICD-10-CM | POA: Diagnosis not present

## 2022-01-23 DIAGNOSIS — Z113 Encounter for screening for infections with a predominantly sexual mode of transmission: Secondary | ICD-10-CM | POA: Diagnosis present

## 2022-01-23 LAB — POCT URINALYSIS DIPSTICK
Glucose, UA: NEGATIVE
Ketones, UA: NEGATIVE
Nitrite, UA: POSITIVE
Protein, UA: POSITIVE — AB

## 2022-01-23 MED ORDER — SULFAMETHOXAZOLE-TRIMETHOPRIM 800-160 MG PO TABS: 1.0000 | ORAL_TABLET | Freq: Two times a day (BID) | ORAL | 0 refills | Status: DC

## 2022-01-23 NOTE — Progress Notes (Signed)
   NURSE VISIT- UTI SYMPTOMS   SUBJECTIVE:  Erica [name] is a 26 y.o. [phone] female here for UTI symptoms. She is a GYN patient. She reports dysuria.  OBJECTIVE:  There were no vitals taken for this visit.  Appears well, in no apparent distress  Results for orders placed or performed in visit on 01/23/22 (from the past 24 hour(s))  POCT Urinalysis Dipstick   Collection Time: 01/23/22 11:14 AM  Result Value Ref Range   Color, UA     Clarity, UA     Glucose, UA Negative Negative   Bilirubin, UA     Ketones, UA neg    Spec Grav, UA     Blood, UA large    pH, UA     Protein, UA Positive (A) Negative   Urobilinogen, UA     Nitrite, UA positive    Leukocytes, UA Large (3+) (A) Negative   Appearance     Odor      ASSESSMENT: GYN patient with UTI symptoms and positive nitrites  PLAN: Discussed with Cyril Mourning, AGNP   Rx sent by provider today: Yes Urine culture sent Call or return to clinic prn if these symptoms worsen or fail to improve as anticipated. Follow-up: as scheduled    Also self collected swab for std screen.   Annamarie Dawley  01/23/2022 11:15 AM

## 2022-01-23 NOTE — Progress Notes (Signed)
+  nitrates on urine will rx septra ds

## 2022-01-24 LAB — URINALYSIS
Bilirubin, UA: NEGATIVE
Glucose, UA: NEGATIVE
Ketones, UA: NEGATIVE
Nitrite, UA: NEGATIVE
Specific Gravity, UA: 1.012 (ref 1.005–1.030)
Urobilinogen, Ur: 0.2 mg/dL (ref 0.2–1.0)
pH, UA: 6 (ref 5.0–7.5)

## 2022-01-25 ENCOUNTER — Other Ambulatory Visit: Payer: Self-pay | Admitting: Adult Health

## 2022-01-25 LAB — CERVICOVAGINAL ANCILLARY ONLY
Bacterial Vaginitis (gardnerella): POSITIVE — AB
Candida Glabrata: NEGATIVE
Candida Vaginitis: NEGATIVE
Chlamydia: NEGATIVE
Comment: NEGATIVE
Comment: NEGATIVE
Comment: NEGATIVE
Comment: NEGATIVE
Comment: NEGATIVE
Comment: NORMAL
Neisseria Gonorrhea: NEGATIVE
Trichomonas: NEGATIVE

## 2022-01-25 LAB — URINE CULTURE

## 2022-01-25 MED ORDER — METRONIDAZOLE 500 MG PO TABS: 500.0000 mg | ORAL_TABLET | Freq: Two times a day (BID) | ORAL | 0 refills | Status: DC

## 2022-01-25 NOTE — Progress Notes (Signed)
+  BV on vaginal swab will rx flagyl,no sex or alcohol while taking  ?

## 2022-01-26 ENCOUNTER — Other Ambulatory Visit: Payer: Self-pay | Admitting: Adult Health

## 2022-01-26 MED ORDER — NITROFURANTOIN MONOHYD MACRO 100 MG PO CAPS: 100.0000 mg | ORAL_CAPSULE | Freq: Two times a day (BID) | ORAL | 0 refills | Status: DC

## 2022-01-26 NOTE — Progress Notes (Signed)
Will rx Macrobid, urine was not sensitive to Septra ds, so stop that

## 2022-02-19 ENCOUNTER — Encounter: Payer: Self-pay | Admitting: Family Medicine

## 2022-03-05 ENCOUNTER — Other Ambulatory Visit (INDEPENDENT_AMBULATORY_CARE_PROVIDER_SITE_OTHER): Payer: Managed Care, Other (non HMO)

## 2022-03-05 ENCOUNTER — Other Ambulatory Visit (HOSPITAL_COMMUNITY)
Admission: RE | Admit: 2022-03-05 | Discharge: 2022-03-05 | Disposition: A | Discharge status: Home / Self Care | Payer: Managed Care, Other (non HMO) | Source: Ambulatory Visit | Attending: Obstetrics & Gynecology | Admitting: Obstetrics & Gynecology

## 2022-03-05 DIAGNOSIS — N898 Other specified noninflammatory disorders of vagina: Secondary | ICD-10-CM | POA: Insufficient documentation

## 2022-03-05 DIAGNOSIS — Z113 Encounter for screening for infections with a predominantly sexual mode of transmission: Secondary | ICD-10-CM | POA: Diagnosis present

## 2022-03-05 NOTE — Progress Notes (Signed)
   NURSE VISIT- VAGINITIS  SUBJECTIVE:  Erica [name] is a 26 y.o. [phone] GYN patientfemale here for a vaginal swab for vaginitis screening.  She reports the following symptoms: discharge described as yellow, odor, and 3  for lower abdominal pain days. Denies abnormal vaginal bleeding, significant pelvic pain, fever, or UTI symptoms.  OBJECTIVE:  There were no vitals taken for this visit.  Appears well, in no apparent distress  ASSESSMENT: Vaginal swab for vaginitis screening  PLAN: Self-collected vaginal probe for Gonorrhea, Chlamydia, Trichomonas, Bacterial Vaginosis, Yeast sent to lab Treatment: to be determined once results are received Follow-up as needed if symptoms persist/worsen, or new symptoms develop  Erica [name]  03/05/2022 4:51 PM

## 2022-03-07 LAB — CERVICOVAGINAL ANCILLARY ONLY
Bacterial Vaginitis (gardnerella): NEGATIVE
Candida Glabrata: NEGATIVE
Candida Vaginitis: NEGATIVE
Chlamydia: NEGATIVE
Comment: NEGATIVE
Comment: NEGATIVE
Comment: NEGATIVE
Comment: NEGATIVE
Comment: NEGATIVE
Comment: NORMAL
Neisseria Gonorrhea: NEGATIVE
Trichomonas: NEGATIVE

## 2022-03-11 ENCOUNTER — Other Ambulatory Visit: Payer: Self-pay | Admitting: Adult Health

## 2022-09-23 ENCOUNTER — Other Ambulatory Visit: Payer: Self-pay | Admitting: Adult Health

## 2022-09-24 ENCOUNTER — Other Ambulatory Visit: Payer: Self-pay | Admitting: Adult Health

## 2022-09-27 ENCOUNTER — Encounter: Payer: Self-pay | Admitting: Adult Health

## 2022-09-27 ENCOUNTER — Other Ambulatory Visit (HOSPITAL_COMMUNITY)
Admission: RE | Admit: 2022-09-27 | Discharge: 2022-09-27 | Disposition: A | Discharge status: Home / Self Care | Payer: Managed Care, Other (non HMO) | Source: Ambulatory Visit | Attending: Adult Health | Admitting: Adult Health

## 2022-09-27 ENCOUNTER — Ambulatory Visit: Payer: Managed Care, Other (non HMO) | Admitting: Adult Health

## 2022-09-27 VITALS — BP 126/81 | HR 100 | Ht 63.0 in | Wt 204.0 lb

## 2022-09-27 DIAGNOSIS — Z01419 Encounter for gynecological examination (general) (routine) without abnormal findings: Secondary | ICD-10-CM | POA: Diagnosis present

## 2022-09-27 DIAGNOSIS — Z3041 Encounter for surveillance of contraceptive pills: Secondary | ICD-10-CM | POA: Diagnosis not present

## 2022-09-27 MED ORDER — NORETHIN ACE-ETH ESTRAD-FE 1-20 MG-MCG PO TABS: 1.0000 | ORAL_TABLET | Freq: Every day | ORAL | 4 refills | Status: DC

## 2022-09-27 NOTE — Progress Notes (Signed)
Patient ID: Erica [name], female   DOB: 11/23/1995, 27 y.o.   MRN: [id number] History of Present Illness: Erica [name] is a 27 year old black female,separated, [id number] in for a well woman gyn exam and pap.  PCP is Harlow Mares NP.   Current Medications, Allergies, Past Medical History, Past Surgical History, Family History and Social History were reviewed in [employer] record.     Review of Systems: Patient denies any headaches, hearing loss, fatigue, blurred vision, shortness of breath, chest pain, abdominal pain, problems with bowel movements, urination, or intercourse. No joint pain or mood swings.  Had itchy rash inner thighs has resolved now but has dark spots now.   Physical Exam: BP 126/81 (BP Location: Left Arm, Patient Position: Sitting, Cuff Size: Normal)   Pulse 100   Ht 5\' 3"  (1.6 m)   Wt 204 lb (92.5 kg)   LMP 09/09/2022 (Approximate)   BMI 36.14 kg/m   General:  Well developed, well nourished, no acute distress Skin:  Warm and dry Neck:  Midline trachea, normal thyroid, good ROM, no lymphadenopathy Lungs; Clear to auscultation bilaterally Breast:  No dominant palpable mass, retraction, or nipple discharge Cardiovascular: Regular rate and rhythm Abdomen:  Soft, non tender, no hepatosplenomegaly Pelvic:  External genitalia is normal in appearance, no lesions.  The vagina is normal in appearance. Urethra has no lesions or masses. The cervix is bulbous, irritated at os, and tender when pap was performed with HR HPV genotyping.No CMT.   Uterus is felt to be normal size, shape, and contour.  No adnexal masses or tenderness noted.Bladder is non tender, no masses felt. Rectal: Good sphincter tone, no polyps, or hemorrhoids felt.  Hemoccult negative. Extremities/musculoskeletal:  No swelling or varicosities noted, no clubbing or cyanosis Psych:  No mood changes, alert and cooperative,seems happy AA is 2 Fall risk is low    09/27/2022    2:31 PM  12/01/2021    4:06 PM 10/16/2021    4:24 PM  Depression screen PHQ 2/9  Decreased Interest 0 0 0  Down, Depressed, Hopeless 0 0 0  PHQ - 2 Score 0 0 0  Altered sleeping 1 0 3  Tired, decreased energy 1 0 3  Change in appetite 0 0 0  Feeling bad or failure about yourself  0 0 0  Trouble concentrating 0 0 0  Moving slowly or fidgety/restless 0 0 0  Suicidal thoughts 0 0 0  PHQ-9 Score 2 0 6  Difficult doing work/chores  Not difficult at all Somewhat difficult       09/27/2022    2:31 PM 12/01/2021    4:06 PM 10/16/2021    4:25 PM 06/13/2021    8:54 AM  GAD 7 : Generalized Anxiety Score  Nervous, Anxious, on Edge 0 0 0 0  Control/stop worrying 0 0 0 0  Worry too much - different things 0 0 0 0  Trouble relaxing 0 0 0 0  Restless 0 0 0 0  Easily annoyed or irritable 0 0 0 0  Afraid - awful might happen 0 0 0 0  Total GAD 7 Score 0 0 0 0  Anxiety Difficulty  Not difficult at all Not difficult at all Not difficult at all      Upstream - 09/27/22 1420       Pregnancy Intention Screening   Does the patient want to become pregnant in the next year? No    Does the patient's partner want to become pregnant  in the next year? No    Would the patient like to discuss contraceptive options today? No      Contraception Wrap Up   Current Method Oral Contraceptive    End Method Oral Contraceptive             Examination chaperoned by Malachy Mood LPN  Impression and plan: 1. Encounter for gynecological examination with Papanicolaou smear of cervix Pap sent Pap in 3 years if negative Physical in 1 year   2. Encounter for surveillance of contraceptive pills Happy with the pill will refill Meds ordered this encounter  Medications   norethindrone-ethinyl estradiol-FE (BLISOVI FE 1/20) 1-20 MG-MCG tablet    Sig: Take 1 tablet by mouth daily.    Dispense:  84 tablet    Refill:  4    Order Specific Question:   Supervising Provider    Answer:   Duane Lope H [[id number]]

## 2022-10-02 LAB — CYTOLOGY - PAP
Comment: NEGATIVE
Diagnosis: NEGATIVE
High risk HPV: NEGATIVE

## 2022-10-18 ENCOUNTER — Other Ambulatory Visit: Payer: Self-pay | Admitting: Adult Health

## 2022-10-18 MED ORDER — CLOTRIMAZOLE-BETAMETHASONE 1-0.05 % EX CREA: TOPICAL_CREAM | CUTANEOUS | 0 refills | Status: DC

## 2022-10-18 NOTE — Progress Notes (Signed)
Rx Lotrisone

## 2022-10-18 NOTE — Progress Notes (Signed)
Resent lotrisone

## 2022-12-10 ENCOUNTER — Other Ambulatory Visit: Payer: Self-pay | Admitting: Adult Health

## 2022-12-10 MED ORDER — FLUCONAZOLE 150 MG PO TABS: ORAL_TABLET | ORAL | 1 refills | Status: DC

## 2022-12-10 NOTE — Progress Notes (Signed)
Rx diflucan.  

## 2023-01-15 ENCOUNTER — Telehealth (INDEPENDENT_AMBULATORY_CARE_PROVIDER_SITE_OTHER): Payer: Managed Care, Other (non HMO) | Admitting: Family Medicine

## 2023-01-15 ENCOUNTER — Telehealth: Payer: Self-pay | Admitting: Family Medicine

## 2023-01-15 ENCOUNTER — Encounter: Payer: Self-pay | Admitting: Family Medicine

## 2023-01-15 DIAGNOSIS — B999 Unspecified infectious disease: Secondary | ICD-10-CM

## 2023-01-15 DIAGNOSIS — B369 Superficial mycosis, unspecified: Secondary | ICD-10-CM

## 2023-01-15 MED ORDER — TERBINAFINE HCL 250 MG PO TABS
250.0000 mg | ORAL_TABLET | Freq: Every day | ORAL | 0 refills | Status: AC
Start: 2023-01-15 — End: 2023-02-14

## 2023-01-15 NOTE — Progress Notes (Signed)
MyChart Video visit  Subjective: Erica [name] PCP: Gabriel Earing, FNP [id number] T Daube is a 27 y.o. female. Patient provides verbal consent for consult held via video.  Due to COVID-19 pandemic this visit was conducted virtually. This visit type was conducted due to national recommendations for restrictions regarding the COVID-19 Pandemic (e.g. social distancing, sheltering in place) in an effort to limit this patient's exposure and mitigate transmission in our community. All issues noted in this document were discussed and addressed.  A physical exam was not performed with this format.   Location of patient: home Location of provider: [facility] Others present for call: none  1.  Rash Reports that she has had a diffusely itchy rash has been ongoing for several weeks now.  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  She is really tried to clean up diet and reduce sugary foods and beverages but for some reason the rash seems to be worse since doing this.  During her evaluation with her OB/GYN she felt this to likely be fungal within the gluteal folds and placed her on topical clotrimazole with betamethasone.  She reports no resolution in this area and in fact has a rash underneath bilateral axilla, under her belly and in her groin region.  She also reports increased dermatitis of the scalp.  Not pregnant and no plans for pregnancy in near future.   ROS: Per HPI  No Known Allergies Past Medical History:  Diagnosis Date   Anemia    Chlamydia 07/06/14   Chlamydia infection 08/02/2014   Chlamydia infection during pregnancy, antepartum 07/27/2014   Had +CHL 2/9 treated in hospital 2/19 will check POT 3/1   Elevated BP 07/27/2014   Ganglion cyst of wrist    left   Pelvic pain in female 07/27/2014   Pregnant 12/29/2014   Screening for STD (sexually transmitted disease) 07/27/2014   Sinus infection 12/29/2014   Strep throat    Vaginal discharge 12/29/2014    Yeast infection 04/07/2013    Current Outpatient Medications:    clotrimazole-betamethasone (LOTRISONE) cream, Use externally bid prn for itching, Disp: 30 g, Rfl: 0   fluconazole (DIFLUCAN) 150 MG tablet, Take 1 now and 1 in 3 days, Disp: 2 tablet, Rfl: 1   norethindrone-ethinyl estradiol-FE (BLISOVI FE 1/20) 1-20 MG-MCG tablet, Take 1 tablet by mouth daily., Disp: 84 tablet, Rfl: 4   phentermine 37.5 MG capsule, Take 1 capsule (37.5 mg total) by mouth every morning., Disp: 30 capsule, Rfl: 0  Gen: well appearing female, NAD Skin: irregularly shaped raised mildly erythematous rash noted under the axilla, along the inner thigh and beneath abdominal pannus.  Assessment/ Plan: 27 y.o. female   Fungal infection of skin - Plan: ANA w/Reflex if Positive, C-reactive protein, Hemoglobin A1c, CMP14+EGFR, Sedimentation rate, CBC with Differential, HIV antibody (with reflex), terbinafine (LAMISIL) 250 MG tablet  Recurrent infections - Plan: ANA w/Reflex if Positive, C-reactive protein, Hemoglobin A1c, CMP14+EGFR, Sedimentation rate, CBC with Differential, HIV antibody (with reflex), terbinafine (LAMISIL) 250 MG tablet  Suspect tinea infection but given reports of recurrent fungal infections offered eval for immunocompromising states.  I've ordered labs and she will have collected at [facility] in [place].  Lamsil ordered given degree of involvement.  She will see her PCP in 4 weeks for follow up.  If no resolution, recommend referral to dermatology.  Consider repeat LFTs since using Lamisil at next visit as well.  Start time: 12:51p End time: 1:00pm  Total time spent on patient care (including video visit/ documentation): 15 minutes  Erica Fujiwara Hulen Skains, DO [facility] [phone]

## 2023-01-15 NOTE — Telephone Encounter (Signed)
Making appt for patient

## 2023-02-13 ENCOUNTER — Ambulatory Visit: Payer: Managed Care, Other (non HMO) | Admitting: Family Medicine

## 2023-02-14 ENCOUNTER — Telehealth: Payer: Managed Care, Other (non HMO) | Admitting: Family Medicine

## 2023-02-14 ENCOUNTER — Telehealth: Payer: Self-pay | Admitting: Family Medicine

## 2023-02-14 ENCOUNTER — Encounter: Payer: Self-pay | Admitting: Family Medicine

## 2023-02-14 DIAGNOSIS — H109 Unspecified conjunctivitis: Secondary | ICD-10-CM

## 2023-02-14 MED ORDER — POLYMYXIN B-TRIMETHOPRIM 10000-0.1 UNIT/ML-% OP SOLN
1.0000 [drp] | Freq: Four times a day (QID) | OPHTHALMIC | 0 refills | Status: AC
Start: 2023-02-14 — End: 2023-02-19

## 2023-02-14 NOTE — Telephone Encounter (Signed)
Pt wants to know if she can share trimethoprim-polymyxin b (POLYTRIM) ophthalmic solution rx to her kids who have pink eye too. They have not been seen. They are 62 and 29 years old. The 5 year wears glasses.  Pt wants to know after how many hours is pt contagious.  Pt says that rx was only covered for 30 days instead of 60 days that it was sent in for. Pt wants to know can she get another rx for additional 30 days. Please call back

## 2023-02-14 NOTE — Telephone Encounter (Signed)
Advised pt she shouldn't share her eye drops and if the kids have pink eye they need an appt to be seen and evaluated. Pt voiced understanding.

## 2023-02-14 NOTE — Progress Notes (Signed)
Virtual Visit via Video   I connected with patient on 02/14/23 at 1300 by a video enabled telemedicine application and verified that I am speaking with the correct person using two identifiers.  Location patient: Home Location provider: [facility] Persons participating in the virtual visit: Patient and Provider  I discussed the limitations of evaluation and management by telemedicine and the availability of in person appointments. The patient expressed understanding and agreed to proceed.  Subjective:   HPI:  Pt presents today for  Chief Complaint  Patient presents with   Eye Problem   Eye Problem  The left eye is affected. This is a new problem. The current episode started yesterday. The problem occurs constantly. There was no injury mechanism. There is Known exposure to pink eye. She Does not wear contacts. Associated symptoms include an eye discharge, eye redness and a foreign body sensation. Pertinent negatives include no blurred vision, double vision, fever, itching, nausea, photophobia, recent URI or vomiting. She has tried water for the symptoms. The treatment provided mild relief.     Review of Systems  Constitutional:  Negative for fever.  Eyes:  Positive for discharge and redness. Negative for blurred vision, double vision, photophobia, pain and itching.  Gastrointestinal:  Negative for nausea and vomiting.  All other systems reviewed and are negative.    Patient Active Problem List   Diagnosis Date Noted   Yeast infection 09/25/2019   Vaginal discharge 09/25/2019   Encounter for gynecological examination with Papanicolaou smear of cervix 12/16/2018   Family planning 12/16/2018   Screening examination for STD (sexually transmitted disease) 12/16/2018   Irregular bleeding 12/16/2018   Nexplanon in place 12/16/2018   Encounter for initial prescription of contraceptive pills 12/16/2018   Postpartum depression 12/31/2017   Nexplanon  insertion 02/05/2017   Monilial vulvovaginitis 05/05/2015   BV (bacterial vaginosis) 05/05/2015   Sinus infection 12/29/2014   Chlamydia 07/27/2014   Sickle cell trait (HCC) 03/17/2013    Social History   Tobacco Use   Smoking status: Never   Smokeless tobacco: Never  Substance Use Topics   Alcohol use: Yes    Comment: sometimes    Current Outpatient Medications:    trimethoprim-polymyxin b (POLYTRIM) ophthalmic solution, Place 1 drop into the left eye in the morning, at noon, in the evening, and at bedtime for 5 days., Disp: 10 mL, Rfl: 0   clotrimazole-betamethasone (LOTRISONE) cream, Use externally bid prn for itching, Disp: 30 g, Rfl: 0   fluconazole (DIFLUCAN) 150 MG tablet, Take 1 now and 1 in 3 days, Disp: 2 tablet, Rfl: 1   norethindrone-ethinyl estradiol-FE (BLISOVI FE 1/20) 1-20 MG-MCG tablet, Take 1 tablet by mouth daily., Disp: 84 tablet, Rfl: 4   phentermine 37.5 MG capsule, Take 1 capsule (37.5 mg total) by mouth every morning., Disp: 30 capsule, Rfl: 0   terbinafine (LAMISIL) 250 MG tablet, Take 1 tablet (250 mg total) by mouth daily., Disp: 30 tablet, Rfl: 0  No Known Allergies  Objective:   There were no vitals taken for this visit.  Patient is well-developed, well-nourished in no acute distress.  Resting comfortably at home.  Head is normocephalic, atraumatic.  No labored breathing.  Speech is clear and coherent with logical content.  Patient is alert and oriented at baseline.  Redness and drainage noted to red eye.   Assessment and Plan:   Erica [name] was seen today for eye problem.  Diagnoses and all orders for this visit:  Bacterial conjunctivitis of  left eye Noted redness and drainage from left eye. Will initiate below. Pt aware of symptomatic care and infection prevention. Report new, worsening, or persistent symptoms.  -     trimethoprim-polymyxin b (POLYTRIM) ophthalmic solution; Place 1 drop into the left eye in the morning, at noon, in the  evening, and at bedtime for 5 days.      Return if symptoms worsen or fail to improve.  Kari Baars, FNP-C [facility] [address] [place], [place] [address] [phone]  02/14/2023  Time spent with the patient: 12 minutes, of which >50% was spent in obtaining information about symptoms, reviewing previous labs, evaluations, and treatments, counseling about condition (please see the discussed topics above), and developing a plan to further investigate it; had a number of questions which I addressed.

## 2023-07-16 ENCOUNTER — Other Ambulatory Visit: Payer: Managed Care, Other (non HMO) | Admitting: *Deleted

## 2023-07-16 ENCOUNTER — Other Ambulatory Visit (HOSPITAL_COMMUNITY)
Admission: RE | Admit: 2023-07-16 | Discharge: 2023-07-16 | Disposition: A | Discharge status: Home / Self Care | Payer: Managed Care, Other (non HMO) | Source: Ambulatory Visit | Attending: Obstetrics & Gynecology | Admitting: Obstetrics & Gynecology

## 2023-07-16 DIAGNOSIS — N898 Other specified noninflammatory disorders of vagina: Secondary | ICD-10-CM | POA: Insufficient documentation

## 2023-07-16 NOTE — Progress Notes (Signed)
   NURSE VISIT- VAGINITIS/STD  SUBJECTIVE:  Erica [name] is a 28 y.o. [id number] GYN patientfemale here for a vaginal swab for vaginitis screening, STD screen.  She reports the following symptoms: discharge described as thick and odor for 2 weeks. Denies abnormal vaginal bleeding, significant pelvic pain, fever, or UTI symptoms.  OBJECTIVE:  There were no vitals taken for this visit.  Appears well, in no apparent distress  ASSESSMENT: Vaginal swab for vaginitis screening & STD screening  PLAN: Self-collected vaginal probe for Gonorrhea, Chlamydia, Trichomonas, Bacterial Vaginosis, Yeast sent to lab Treatment: to be determined once results are received Follow-up as needed if symptoms persist/worsen, or new symptoms develop  Malachy Mood  07/16/2023 9:48 AM

## 2023-07-17 LAB — CERVICOVAGINAL ANCILLARY ONLY
Bacterial Vaginitis (gardnerella): NEGATIVE
Candida Glabrata: NEGATIVE
Candida Vaginitis: NEGATIVE
Chlamydia: NEGATIVE
Comment: NEGATIVE
Comment: NEGATIVE
Comment: NEGATIVE
Comment: NEGATIVE
Comment: NEGATIVE
Comment: NORMAL
Neisseria Gonorrhea: NEGATIVE
Trichomonas: NEGATIVE

## 2023-08-16 ENCOUNTER — Ambulatory Visit: Admitting: *Deleted

## 2023-08-16 ENCOUNTER — Other Ambulatory Visit (HOSPITAL_COMMUNITY)
Admission: RE | Admit: 2023-08-16 | Discharge: 2023-08-16 | Disposition: A | Discharge status: Home / Self Care | Source: Ambulatory Visit | Attending: Obstetrics & Gynecology | Admitting: Obstetrics & Gynecology

## 2023-08-16 DIAGNOSIS — N898 Other specified noninflammatory disorders of vagina: Secondary | ICD-10-CM | POA: Diagnosis present

## 2023-08-16 NOTE — Progress Notes (Signed)
   NURSE VISIT- VAGINITIS  SUBJECTIVE:  Erica [name] is a 28 y.o. [id number] GYN patientfemale here for a vaginal swab for vaginitis screening.  She reports the following symptoms: continued discharge described as creamy and local irritation. Did swab in February and was negative but is continuing to have yeast infection symptoms. Requesting diflucan. Denies abnormal vaginal bleeding, significant pelvic pain, fever, or UTI symptoms. Mentioned A1C in June was 5.8 but did not f/u with PCP.   OBJECTIVE:  There were no vitals taken for this visit.  Appears well, in no apparent distress  ASSESSMENT: Vaginal swab for vaginitis screening  PLAN: Self-collected vaginal probe for Bacterial Vaginosis, Yeast sent to lab Treatment: to be determined once results are received, requesting Diflucan Follow-up as needed if symptoms persist/worsen, or new symptoms develop Advised to f/u w/ PCP for elevated A1C  Kamryn Gauthier  08/16/2023 11:18 AM

## 2023-08-19 ENCOUNTER — Encounter: Payer: Self-pay | Admitting: Family Medicine

## 2023-08-19 ENCOUNTER — Ambulatory Visit: Admitting: Family Medicine

## 2023-08-19 VITALS — BP 111/71 | HR 97 | Temp 97.5°F | Ht 63.0 in | Wt 210.0 lb

## 2023-08-19 DIAGNOSIS — R5383 Other fatigue: Secondary | ICD-10-CM | POA: Diagnosis not present

## 2023-08-19 DIAGNOSIS — Z79899 Other long term (current) drug therapy: Secondary | ICD-10-CM

## 2023-08-19 DIAGNOSIS — E66812 Obesity, class 2: Secondary | ICD-10-CM | POA: Diagnosis not present

## 2023-08-19 DIAGNOSIS — E6609 Other obesity due to excess calories: Secondary | ICD-10-CM

## 2023-08-19 DIAGNOSIS — Z87898 Personal history of other specified conditions: Secondary | ICD-10-CM

## 2023-08-19 DIAGNOSIS — Z6837 Body mass index (BMI) 37.0-37.9, adult: Secondary | ICD-10-CM

## 2023-08-19 LAB — CERVICOVAGINAL ANCILLARY ONLY
Bacterial Vaginitis (gardnerella): NEGATIVE
Candida Glabrata: NEGATIVE
Candida Vaginitis: NEGATIVE
Comment: NEGATIVE
Comment: NEGATIVE
Comment: NEGATIVE

## 2023-08-19 LAB — PREGNANCY, URINE: Preg Test, Ur: NEGATIVE

## 2023-08-19 LAB — BAYER DCA HB A1C WAIVED: HB A1C (BAYER DCA - WAIVED): 5.4 % (ref 4.8–5.6)

## 2023-08-19 MED ORDER — PHENTERMINE HCL 37.5 MG PO TABS: 37.5000 mg | ORAL_TABLET | Freq: Every day | ORAL | 0 refills | Status: DC

## 2023-08-19 NOTE — Progress Notes (Signed)
 Acute Office Visit  Subjective:     Patient ID: Erica [name], female    DOB: 1996-05-28, 28 y.o.   MRN: [id number]  Chief Complaint  Patient presents with   Blood Sugar Problem    Was told by OBGYN that diabetes could be the cause of very frequent yeast infection. Gets very shaky if not eating often. Hx of prediabetic levels. Also fatigued.     HPI Patient is in today for concern for diabetes. Hx of prediabetes with A1c of 5.8 in June of last year. She has been getting recurrent yeast infections and GYN recommend follow up for A1c for possible DM.   She also reports fatigue for the last years despite getting 8 hours of sleep. Taking a daily multivitamin. Eats a well balanced diet. Walks on treadmill 30 mins a day. No weight loss despite this. Was on wegovy at one point. Insurance no longer covers [employer]. Has tried phentermine in the past and did well. Interested in this again.  ROS As per HPI.      Objective:    BP 111/71   Pulse 97   Temp (!) 97.5 F (36.4 C)   Ht 5\' 3"  (1.6 m)   Wt 210 lb (95.3 kg)   SpO2 99%   BMI 37.20 kg/m  Wt Readings from Last 3 Encounters:  08/19/23 210 lb (95.3 kg)  09/27/22 204 lb (92.5 kg)  12/01/21 189 lb 6 oz (85.9 kg)      Physical Exam Vitals and nursing note reviewed.  Constitutional:      General: She is not in acute distress.    Appearance: Normal appearance. She is not ill-appearing.  Cardiovascular:     Rate and Rhythm: Normal rate and regular rhythm.     Pulses: Normal pulses.     Heart sounds: Normal heart sounds. No murmur heard. Pulmonary:     Effort: Pulmonary effort is normal. No respiratory distress.     Breath sounds: Normal breath sounds.  Abdominal:     General: Bowel sounds are normal. There is no distension.     Palpations: Abdomen is soft. There is no mass.     Tenderness: There is no abdominal tenderness. There is no guarding or rebound.  Musculoskeletal:     Cervical back: Neck supple. No  tenderness.     Right lower leg: No edema.     Left lower leg: No edema.  Lymphadenopathy:     Cervical: No cervical adenopathy.  Skin:    General: Skin is warm and dry.  Neurological:     General: No focal deficit present.     Mental Status: She is alert and oriented to person, place, and time.  Psychiatric:        Mood and Affect: Mood normal.        Behavior: Behavior normal.     No results found for any visits on 08/19/23.      Assessment & Plan:   Erica [name] was seen today for blood sugar problem.  Diagnoses and all orders for this visit:  History of prediabetes A1c 5.4 today.  -     Bayer DCA Hb A1c Waived  Class 2 obesity due to excess calories without serious comorbidity with body mass index (BMI) of 37.0 to 37.9 in adult PDMP reviewed, no red flags. Negative pregnancy test today. EKG with NSR today. PDMP reviewed, no red flags. Discussed phentermine along with continued diet and exercise to assist with weight loss. Side effects and  risks discussed.  -     Bayer DCA Hb A1c Waived -     TSH + free T4 -     phentermine (ADIPEX-P) 37.5 MG tablet; Take 1 tablet (37.5 mg total) by mouth daily before breakfast.  Other fatigue Labs pending as below.  -     Anemia Profile B -     TSH + free T4 -     CMP14+EGFR  High risk medication use -     POCT urine pregnancy -     EKG 12-Lead -     Pregnancy, urine  Return in about 4 weeks (around 09/16/2023) for medication follow up.  The patient indicates understanding of these issues and agrees with the plan.  Gabriel Earing, FNP

## 2023-09-12 ENCOUNTER — Other Ambulatory Visit: Payer: Self-pay

## 2023-09-12 ENCOUNTER — Encounter: Payer: Self-pay | Admitting: Family Medicine

## 2023-09-12 ENCOUNTER — Ambulatory Visit: Admitting: Family Medicine

## 2023-09-12 DIAGNOSIS — Z6837 Body mass index (BMI) 37.0-37.9, adult: Secondary | ICD-10-CM | POA: Diagnosis not present

## 2023-09-12 DIAGNOSIS — E66812 Obesity, class 2: Secondary | ICD-10-CM | POA: Insufficient documentation

## 2023-09-12 DIAGNOSIS — B999 Unspecified infectious disease: Secondary | ICD-10-CM

## 2023-09-12 DIAGNOSIS — E6609 Other obesity due to excess calories: Secondary | ICD-10-CM | POA: Insufficient documentation

## 2023-09-12 DIAGNOSIS — B369 Superficial mycosis, unspecified: Secondary | ICD-10-CM

## 2023-09-12 MED ORDER — PHENTERMINE HCL 37.5 MG PO TABS: 37.5000 mg | ORAL_TABLET | Freq: Every day | ORAL | 0 refills | Status: DC

## 2023-09-12 NOTE — Progress Notes (Signed)
   Established Patient Office Visit  Subjective   Patient ID: Erica [name], female    DOB: 1995/07/25  Age: 28 y.o. MRN: [id number]  Chief Complaint  Patient presents with   4 week recheck    HPI Niharika is her for follow up after starting phentermine. Reports doing well with no side effects. She has been eating a well balanced diet. She has been walking on a treadmill for 1 hour a day. She has lost 4 lbs over the last month. Denies possibility of pregnancy.   Starting weight: 210    ROS As per HPI.    Objective:     BP 123/83   Pulse 90   Temp (!) 97.2 F (36.2 C) (Temporal)   Ht 5\' 3"  (1.6 m)   Wt 206 lb (93.4 kg)   SpO2 100%   BMI 36.49 kg/m  Wt Readings from Last 3 Encounters:  09/12/23 206 lb (93.4 kg)  08/19/23 210 lb (95.3 kg)  09/27/22 204 lb (92.5 kg)      Physical Exam Vitals and nursing note reviewed.  Constitutional:      General: She is not in acute distress.    Appearance: She is obese. She is not ill-appearing, toxic-appearing or diaphoretic.  Cardiovascular:     Rate and Rhythm: Normal rate and regular rhythm.     Pulses: Normal pulses.     Heart sounds: Normal heart sounds. No murmur heard. Pulmonary:     Effort: Pulmonary effort is normal. No respiratory distress.     Breath sounds: Normal breath sounds.  Musculoskeletal:     Right lower leg: No edema.     Left lower leg: No edema.  Skin:    General: Skin is warm and dry.  Neurological:     General: No focal deficit present.     Mental Status: She is alert and oriented to person, place, and time.  Psychiatric:        Mood and Affect: Mood normal.        Behavior: Behavior normal.      No results found for any visits on 09/12/23.    The ASCVD Risk score (Arnett DK, et al., 2019) failed to calculate for the following reasons:   The 2019 ASCVD risk score is only valid for ages 79 to 69    Assessment & Plan:   Britani was seen today for 4 week recheck.  Diagnoses  and all orders for this visit:  Class 2 obesity due to excess calories without serious comorbidity with body mass index (BMI) of 37.0 to 37.9 in adult PDMP reviewed, no red flags. Refill provided. Dated fro 4/24, 30 days out for last pick up. Continue diet and exercise.  -     phentermine (ADIPEX-P) 37.5 MG tablet; Take 1 tablet (37.5 mg total) by mouth daily before breakfast.  Return in about 1 month (around 10/14/2023) for medication follow up.   The patient indicates understanding of these issues and agrees with the plan.  Albertha Huger, FNP

## 2023-09-13 LAB — CBC WITH DIFFERENTIAL/PLATELET
Basophils Absolute: 0 10*3/uL (ref 0.0–0.2)
Basos: 0 %
EOS (ABSOLUTE): 0.1 10*3/uL (ref 0.0–0.4)
Eos: 2 %
Hematocrit: 42.9 % (ref 34.0–46.6)
Hemoglobin: 13.8 g/dL (ref 11.1–15.9)
Immature Grans (Abs): 0 10*3/uL (ref 0.0–0.1)
Immature Granulocytes: 0 %
Lymphocytes Absolute: 1.8 10*3/uL (ref 0.7–3.1)
Lymphs: 22 %
MCH: 27.6 pg (ref 26.6–33.0)
MCHC: 32.2 g/dL (ref 31.5–35.7)
MCV: 86 fL (ref 79–97)
Monocytes Absolute: 0.5 10*3/uL (ref 0.1–0.9)
Monocytes: 6 %
Neutrophils Absolute: 5.6 10*3/uL (ref 1.4–7.0)
Neutrophils: 70 %
Platelets: 347 10*3/uL (ref 150–450)
RBC: 5 x10E6/uL (ref 3.77–5.28)
RDW: 12.9 % (ref 11.7–15.4)
WBC: 8 10*3/uL (ref 3.4–10.8)

## 2023-09-13 LAB — HIV ANTIBODY (ROUTINE TESTING W REFLEX): HIV Screen 4th Generation wRfx: NONREACTIVE

## 2023-09-13 LAB — C-REACTIVE PROTEIN: CRP: 6 mg/L (ref 0–10)

## 2023-09-13 LAB — SEDIMENTATION RATE: Sed Rate: 21 mm/h (ref 0–32)

## 2023-09-13 LAB — ANA W/REFLEX IF POSITIVE: Anti Nuclear Antibody (ANA): NEGATIVE

## 2023-09-13 LAB — HEMOGLOBIN A1C
Est. average glucose Bld gHb Est-mCnc: 114 mg/dL
Hgb A1c MFr Bld: 5.6 % (ref 4.8–5.6)

## 2023-09-16 ENCOUNTER — Encounter: Payer: Self-pay | Admitting: Family Medicine

## 2023-10-15 ENCOUNTER — Telehealth: Payer: Self-pay

## 2023-10-15 NOTE — Telephone Encounter (Signed)
 FYI ONLY- Spoke with patient. She reports small amount of bright red blood, denies any black or dark blood. Reports mostly noticed on tissue and a little in toilet after bowel movement. Not heavy, just small amount. Reports no pain or history of hemorrhoids. Advised patient okay to discuss with provider at office visit scheduled for this Friday. Advised if any changes in amount or color or develops any new symptoms to call our office. Patient agreeable.

## 2023-10-15 NOTE — Telephone Encounter (Signed)
 Copied from CRM [id number]. Topic: Clinical - Medical Advice >> Oct 15, 2023 10:54 AM [name] wrote: Reason for CRM: Patient wants to speak with a nurse regarding blood in her stool for the past week.  No constipation nor diarrhea.

## 2023-10-18 ENCOUNTER — Ambulatory Visit: Admitting: Family Medicine

## 2023-10-18 ENCOUNTER — Encounter: Payer: Self-pay | Admitting: Family Medicine

## 2023-10-18 VITALS — BP 120/81 | HR 101 | Ht 63.0 in | Wt 206.6 lb

## 2023-10-18 DIAGNOSIS — E6609 Other obesity due to excess calories: Secondary | ICD-10-CM | POA: Diagnosis not present

## 2023-10-18 DIAGNOSIS — Z6837 Body mass index (BMI) 37.0-37.9, adult: Secondary | ICD-10-CM

## 2023-10-18 DIAGNOSIS — E66812 Obesity, class 2: Secondary | ICD-10-CM | POA: Diagnosis not present

## 2023-10-18 DIAGNOSIS — K648 Other hemorrhoids: Secondary | ICD-10-CM | POA: Diagnosis not present

## 2023-10-18 DIAGNOSIS — K921 Melena: Secondary | ICD-10-CM | POA: Diagnosis not present

## 2023-10-18 LAB — HEMOGLOBIN, FINGERSTICK: Hemoglobin: 12.2 g/dL (ref 11.1–15.9)

## 2023-10-18 LAB — PREGNANCY, URINE: Preg Test, Ur: NEGATIVE

## 2023-10-18 MED ORDER — PHENTERMINE HCL 37.5 MG PO TABS: 37.5000 mg | ORAL_TABLET | Freq: Every day | ORAL | 0 refills | Status: DC

## 2023-10-18 NOTE — Progress Notes (Signed)
 Established Patient Office Visit  Subjective   Patient ID: Erica [name], female    DOB: Mar 23, 1996  Age: 28 y.o. MRN: [id number]  Chief Complaint  Patient presents with   Obesity    HPI Erica [name] is her for follow up after starting phentermine . She has completed her 2nd month of phentermine . Denies side effects. Lost 4 lbs the first month. No net weight loss this month. Doing cardio- 30 mins on treadmill daily along with 30 mins of zumba. Eating lean meats, whole grains, fruits, some vegetables. HR is up a little today. She did have two cups of coffee this morning just prior to her visit.    Starting weight: 210  LMP: 09/27/23  She has noticed small amounts of blood when wiping after a BM for the last 10 days. She has seen some bright red blood on her stool. Denies constipation. She has a BM every 1-2 days without straining. Had a few dizzy spells about 1 week ago but not since. Denies abdominal pain, nausea, vomiting, weight loss, rectal pain. Unsure if she has hemorrhoids.    ROS As per HPI.    Objective:     BP 120/81   Pulse (!) 101   Ht 5\' 3"  (1.6 m)   Wt 206 lb 9.6 oz (93.7 kg)   BMI 36.60 kg/m  Wt Readings from Last 3 Encounters:  10/18/23 206 lb 9.6 oz (93.7 kg)  09/12/23 206 lb (93.4 kg)  08/19/23 210 lb (95.3 kg)      Physical Exam Vitals and nursing note reviewed. Exam conducted with a chaperone present.  Constitutional:      General: She is not in acute distress.    Appearance: She is obese. She is not ill-appearing, toxic-appearing or diaphoretic.  Cardiovascular:     Rate and Rhythm: Normal rate and regular rhythm.     Pulses: Normal pulses.     Heart sounds: Normal heart sounds. No murmur heard. Pulmonary:     Effort: Pulmonary effort is normal. No respiratory distress.     Breath sounds: Normal breath sounds.  Genitourinary:    Rectum: Internal hemorrhoid (small) present. No mass, tenderness, anal fissure or external hemorrhoid. Normal anal  tone.  Musculoskeletal:     Right lower leg: No edema.     Left lower leg: No edema.  Skin:    General: Skin is warm and dry.  Neurological:     General: No focal deficit present.     Mental Status: She is alert and oriented to person, place, and time.  Psychiatric:        Mood and Affect: Mood normal.        Behavior: Behavior normal.      No results found for any visits on 10/18/23.    The ASCVD Risk score (Arnett DK, et al., 2019) failed to calculate for the following reasons:   The 2019 ASCVD risk score is only valid for ages 5 to 44    Assessment & Plan:   Erica [name] was seen today for obesity.  Diagnoses and all orders for this visit:  Class 2 obesity due to excess calories without serious comorbidity with body mass index (BMI) of 37.0 to 37.9 in adult Negative pregnancy test today. On OCPs. PDMP reviewed no red flags. No weight loss since last visit. Net weight loss of 4 lbs so far. Continue diet, exercise. Discussed if no weight loss at next appt, with d/c phentermine  and discuss other options. Instructed to call her  insurance regarding coverage for weight loss meds.  -     Pregnancy, urine -     phentermine  (ADIPEX-P ) 37.5 MG tablet; Take 1 tablet (37.5 mg total) by mouth daily before breakfast.  Blood in stool, frank Internal hemorrhoid Discussed likely etiology of rectal bleeding. Preparation H OTC prn. Avoid constipation, straining. FOBT is pending. Normal Hgb today. Return to office for new or worsening symptoms, or if symptoms persist. Will place GI referral if persists or FOBT is positive.  -     Fecal occult blood, imunochemical([facility]) -     Hemoglobin, fingerstick   Return in about 4 weeks (around 11/15/2023) for medication follow up.   The patient indicates understanding of these issues and agrees with the plan.  Albertha Huger, FNP

## 2023-10-25 ENCOUNTER — Telehealth: Payer: Self-pay | Admitting: Family Medicine

## 2023-10-25 NOTE — Telephone Encounter (Signed)
 Copied from CRM [id number]. Topic: General - Billing Inquiry >> Oct 25, 2023  9:44 AM [employer] wrote: Reason for CRM: Patient would like a callback to discuss [facility] bill she received. She stated she should not be receiving a bill because she works for [employer] : [phone]

## 2023-10-25 NOTE — Telephone Encounter (Signed)
 Copied from CRM [id number]. Topic: Clinical - Medication Prior Auth >> Oct 25, 2023  9:34 AM Alpha Arts wrote: Reason for CRM: Patient states she needs prior authorization for Wegovy  and she would also like a call back to discuss medication.  Callback #: [phone]

## 2023-10-25 NOTE — Telephone Encounter (Signed)
 I spoke to pt and advised I would have to ask Tiffany if we can send in Wegovy  as her OV notes had pt coming back in a month for weight check and if not losing wt would discuss other alternatives to phentermine  and pt voiced understanding. Do you want to send in Wegovy  now or wait till pt comes in for visit?

## 2023-10-28 NOTE — Telephone Encounter (Signed)
Left detailed message per dpr  

## 2023-10-28 NOTE — Telephone Encounter (Signed)
 Will discuss at next visit. She also needs to contact her insurance regarding coverage for weight loss medicaiton

## 2023-11-13 ENCOUNTER — Encounter: Payer: Self-pay | Admitting: Family Medicine

## 2023-11-13 ENCOUNTER — Ambulatory Visit: Admitting: Family Medicine

## 2023-11-13 VITALS — BP 130/89 | HR 97 | Temp 97.9°F | Ht 63.0 in | Wt 207.2 lb

## 2023-11-13 DIAGNOSIS — E66812 Obesity, class 2: Secondary | ICD-10-CM | POA: Diagnosis not present

## 2023-11-13 DIAGNOSIS — Z6836 Body mass index (BMI) 36.0-36.9, adult: Secondary | ICD-10-CM

## 2023-11-13 DIAGNOSIS — E6609 Other obesity due to excess calories: Secondary | ICD-10-CM | POA: Diagnosis not present

## 2023-11-13 MED ORDER — WEGOVY 0.25 MG/0.5ML ~~LOC~~ SOAJ: 0.2500 mg | SUBCUTANEOUS | 0 refills | Status: DC

## 2023-11-13 MED ORDER — WEGOVY 0.5 MG/0.5ML ~~LOC~~ SOAJ
0.5000 mg | SUBCUTANEOUS | 0 refills | Status: DC
Start: 2023-12-13 — End: 2024-01-30

## 2023-11-13 NOTE — Progress Notes (Signed)
 Established Patient Office Visit  Subjective   Patient ID: Erica [name], female    DOB: 02-03-1996  Age: 28 y.o. MRN: [id number]  Chief Complaint  Patient presents with   Obesity    HPI Erica [name] is her for follow up after starting phentermine . She has completed her 3rd month of phentermine . Denies side effects. Lost 4 lbs the first month. She gained 1 lbs since her last visit. Doing cardio- 30 mins on treadmill daily along with 30 mins of zumba. She has maintained a well balanced diet- eating lean meats, whole grains, fruits, some vegetables. Previously was on wegovy  with insurance coverage but wasn't able to maintain this due to shortage at the time.   Starting weight: 210 Net loss: 3 lbs LMP: 11/10/23   ROS As per HPI.    Objective:     BP 130/89   Pulse 97   Temp 97.9 F (36.6 C) (Temporal)   Ht 5' 3 (1.6 m)   Wt 207 lb 3.2 oz (94 kg)   SpO2 100%   BMI 36.70 kg/m  Wt Readings from Last 3 Encounters:  11/13/23 207 lb 3.2 oz (94 kg)  10/18/23 206 lb 9.6 oz (93.7 kg)  09/12/23 206 lb (93.4 kg)      Physical Exam Vitals and nursing note reviewed.  Constitutional:      General: She is not in acute distress.    Appearance: She is obese. She is not ill-appearing, toxic-appearing or diaphoretic.   Cardiovascular:     Rate and Rhythm: Normal rate and regular rhythm.     Pulses: Normal pulses.     Heart sounds: Normal heart sounds. No murmur heard. Pulmonary:     Effort: Pulmonary effort is normal. No respiratory distress.     Breath sounds: Normal breath sounds.   Musculoskeletal:     Right lower leg: No edema.     Left lower leg: No edema.   Skin:    General: Skin is warm and dry.   Neurological:     General: No focal deficit present.     Mental Status: She is alert and oriented to person, place, and time.   Psychiatric:        Mood and Affect: Mood normal.        Behavior: Behavior normal.      No results found for any visits on  11/13/23.    The ASCVD Risk score (Arnett DK, et al., 2019) failed to calculate for the following reasons:   The 2019 ASCVD risk score is only valid for ages 11 to 63    Assessment & Plan:   Erica [name] was seen today for obesity.  Diagnoses and all orders for this visit:  Class 2 obesity due to excess calories without serious comorbidity with body mass index (BMI) of 36.0 to 36.9 in adult Patient's BMI is >30 mg/m2.  Patient's current BMI is Body mass index is 36.7 kg/m.Aaron Aas  Patient is currently enrolled in a healthy eating plan along with daily exercise.  Patient has failed phentermine . Patient does not have a personal or family history of medullary thyroid carcinoma (MTC) or Multiple Endocrine Neoplasia syndrome type 2 (MEN 2). -     Semaglutide -Weight Management (WEGOVY ) 0.25 MG/0.5ML SOAJ; Inject 0.25 mg into the skin once a week. -     Semaglutide -Weight Management (WEGOVY ) 0.5 MG/0.5ML SOAJ; Inject 0.5 mg into the skin once a week.  Return in about 6 weeks (around 12/25/2023) for medication follow up.  The patient indicates understanding of these issues and agrees with the plan.  Albertha Huger, FNP

## 2023-11-19 ENCOUNTER — Telehealth: Payer: Self-pay | Admitting: Family Medicine

## 2023-11-19 ENCOUNTER — Telehealth: Payer: Self-pay | Admitting: Pharmacist

## 2023-11-19 NOTE — Telephone Encounter (Signed)
 PA request has been Submitted. New Encounter has been or will be created for follow up. For additional info see Pharmacy Prior Auth telephone encounter from 11/19/23.

## 2023-11-19 NOTE — Telephone Encounter (Unsigned)
 Copied from CRM [id number]. Topic: Clinical - Medication Prior Auth >> Nov 19, 2023 10:51 AM Tiffany S wrote: Reason for CRM: Patient has question about prior auth for wegovy  please follow up with next steps

## 2023-11-19 NOTE — Telephone Encounter (Signed)
 Pharmacy Patient Advocate Encounter   Received notification from Patient Pharmacy that prior authorization for Wegovy  0.25MG /0.5ML auto-injectors is required/requested.   Insurance verification completed.   The patient is insured through [facility] .   Per test claim: PA required; PA submitted to above mentioned insurance via CoverMyMeds Key/confirmation #/EOC [id number] Status is pending

## 2023-11-25 ENCOUNTER — Telehealth: Payer: Self-pay | Admitting: Family Medicine

## 2023-11-25 ENCOUNTER — Telehealth: Payer: Self-pay

## 2023-11-25 NOTE — Telephone Encounter (Signed)
 Per insurance the documentation from where the pt lost weight the 1st time on Wegovy  in 2023 needs to be sent to them. Informed pt that I would forward the message to our PA team. Pt would like a call back with PA response.

## 2023-11-25 NOTE — Telephone Encounter (Signed)
 Copied from CRM [id number]. Topic: Clinical - Medication Prior Auth >> Nov 19, 2023 10:51 AM Annabella S wrote: Reason for CRM: Patient has question about prior auth for wegovy  please follow up with next steps >> Nov 25, 2023 11:12 AM Montie POUR wrote: Estephanie would like to speak with FNP Joesph about the next step to getting Wegovy  approved. Rosene called her insurance and was told what needs to be done for approval. Please call her at [phone]. Thanks

## 2023-11-25 NOTE — Telephone Encounter (Signed)
 Please see previous telephone encounter. Signing note.

## 2023-11-25 NOTE — Telephone Encounter (Signed)
 Copied from CRM [id number]. Topic: Clinical - Medication Prior Auth >> Nov 19, 2023 10:51 AM Tiffany S wrote: Reason for CRM: Patient has question about prior auth for wegovy  please follow up with next steps

## 2023-11-26 NOTE — Telephone Encounter (Signed)
 Pharmacy Patient Advocate Encounter  Received notification from [employer] that Prior Authorization for Wegovy  0.25MG /0.5ML auto-injectors  has been DENIED.  Full denial letter will be uploaded to the media tab. See denial reason below.   PA #/Case ID/Reference #: [id number]

## 2023-11-26 NOTE — Telephone Encounter (Signed)
 PA request has been Denied. New Encounter has been or will be created for follow up. For additional info see Pharmacy Prior Auth telephone encounter from 11/19/23.

## 2023-11-27 ENCOUNTER — Other Ambulatory Visit (HOSPITAL_COMMUNITY): Payer: Self-pay

## 2023-12-02 ENCOUNTER — Telehealth: Payer: Self-pay | Admitting: Family Medicine

## 2023-12-02 NOTE — Telephone Encounter (Signed)
 Reviewed the initial appeal and all questions were answered correctly.  If you would like, we can try for an appeal!  Thanks, Devere Pandy, PharmD Clinical Pharmacist    Direct Dial: [phone]

## 2023-12-02 NOTE — Telephone Encounter (Signed)
 Specialist did discuss the current update with the patient

## 2023-12-02 NOTE — Telephone Encounter (Unsigned)
 Copied from CRM [id number]. Topic: Clinical - Medication Prior Auth >> Nov 19, 2023 10:51 AM Annabella S wrote: Reason for CRM: Patient has question about prior auth for wegovy  please follow up with next steps >> Dec 02, 2023 10:19 AM Zane F wrote: Patient called in to discuss an update on PA for wegovy . Specialist did discuss the current update with the patient >> Nov 25, 2023 11:12 AM Cynthia K wrote: Gen[place]efa would like to speak with FNP Joesph about the next step to getting Wegovy  approved. [place] called her insurance and was told what needs to be done for approval. Please call her at [phone]. Thanks

## 2023-12-03 NOTE — Telephone Encounter (Signed)
 Hi! I was routed this request This one should've been completed as an initial request? Patient was on for 2 months back in 2023 with a different plan (unless I'm missing something?) Would call insurance or may have to appeal These questions appear to be the follow up questions to wegovy  No need to route back to me; would forward response to PCP/[facility] clinical Thank you!

## 2023-12-04 ENCOUNTER — Telehealth: Payer: Self-pay

## 2023-12-04 ENCOUNTER — Other Ambulatory Visit (HOSPITAL_COMMUNITY): Payer: Self-pay

## 2023-12-04 NOTE — Telephone Encounter (Signed)
 Copied from CRM [id number]. Topic: Clinical - Medication Prior Auth >> Dec 04, 2023 10:33 AM Vena H wrote: Reason for CRM: Pt called in to check the status of Prior Auth. For Wegovy . Pt's call back number [phone]

## 2023-12-04 NOTE — Telephone Encounter (Signed)
 Spoke with patient and she said that she called the pharmacy and it was approved. No further questions at this time.

## 2023-12-05 ENCOUNTER — Telehealth (INDEPENDENT_AMBULATORY_CARE_PROVIDER_SITE_OTHER): Admitting: Family

## 2023-12-05 ENCOUNTER — Other Ambulatory Visit (HOSPITAL_COMMUNITY): Payer: Self-pay

## 2023-12-05 ENCOUNTER — Ambulatory Visit: Payer: Self-pay

## 2023-12-05 ENCOUNTER — Encounter: Payer: Self-pay | Admitting: Family

## 2023-12-05 DIAGNOSIS — J019 Acute sinusitis, unspecified: Secondary | ICD-10-CM

## 2023-12-05 MED ORDER — FLUCONAZOLE 150 MG PO TABS: 150.0000 mg | ORAL_TABLET | ORAL | 0 refills | Status: DC | PRN

## 2023-12-05 MED ORDER — AMOXICILLIN-POT CLAVULANATE 875-125 MG PO TABS
1.0000 | ORAL_TABLET | Freq: Two times a day (BID) | ORAL | 0 refills | Status: DC
Start: 2023-12-05 — End: 2023-12-25

## 2023-12-05 NOTE — Telephone Encounter (Signed)
 E2C2 scheduled appointment.

## 2023-12-05 NOTE — Telephone Encounter (Signed)
 FYI Only or Action Required?: FYI only for provider.  Patient was last seen in primary care on 11/13/2023 by Erica Annabella HERO, FNP.  Called Nurse Triage reporting Sinusitis and Cough.  Symptoms began a week ago.  Interventions attempted: OTC medications: Zyrtec, Sudafed, Tylenol  cold.  Symptoms are: runny nose, sinus pressure and congestion, cough, post nasal drip, left ear ache gradually worsening.  Triage Disposition: See Physician Within 24 Hours  Patient/caregiver understands and will follow disposition?: Yes                  Copied from CRM [id number]. Topic: Clinical - Red Word Triage >> Dec 05, 2023 11:30 AM Ivette P wrote: Red Word that prompted transfer to Nurse Triage: sinus infection. cough sutffy nose. congestion. dark yellowish color. going on for a week. thought was allergies and took OTC cold medicine and sitll not getting any better Reason for Disposition  Earache  Answer Assessment - Initial Assessment Questions 1. LOCATION: Where does it hurt?      Majority is center of face, sinus pressure.  2. ONSET: When did the sinus pain start?  (e.g., hours, days)      X 1 week.  3. SEVERITY: How bad is the pain?   (Scale 0-10; or none, mild, moderate or severe)     8/10.  4. RECURRENT SYMPTOM: Have you ever had sinus problems before? If Yes, ask: When was the last time? and What happened that time?      Yes, states she gets sinus infections twice a year.  5. NASAL CONGESTION: Is the nose blocked? If Yes, ask: Can you open it or must you breathe through your mouth?     Yes, mostly at night she states both nostrils will get stopped up.  6. NASAL DISCHARGE: Do you have discharge from your nose? If so ask, What color?     Yes, dark yellow.  7. FEVER: Do you have a fever? If Yes, ask: What is it, how was it measured, and when did it start?      No.  8. OTHER SYMPTOMS: Do you have any other symptoms? (e.g., sore throat, cough,  earache, difficulty breathing)     Cough, post nasal drip, runny nose, left earache.  9. PREGNANCY: Is there any chance you are pregnant? When was your last menstrual period?     LMP: 2 weeks ago.  Protocols used: Sinus Pain or Congestion-A-AH

## 2023-12-05 NOTE — Progress Notes (Signed)
 Virtual Visit Consent   Erica [name], you are scheduled for a virtual visit with a [facility] provider today. Just as with appointments in the office, your consent must be obtained to participate. Your consent will be active for this visit and any virtual visit you may have with one of our providers in the next 365 days. If you have a MyChart account, a copy of this consent can be sent to you electronically.  As this is a virtual visit, video technology does not allow for your provider to perform a traditional examination. This may limit your provider's ability to fully assess your condition. If your provider identifies any concerns that need to be evaluated in person or the need to arrange testing (such as labs, EKG, etc.), we will make arrangements to do so. Although advances in technology are sophisticated, we cannot ensure that it will always work on either your end or our end. If the connection with a video visit is poor, the visit may have to be switched to a telephone visit. With either a video or telephone visit, we are not always able to ensure that we have a secure connection.  By engaging in this virtual visit, you consent to the provision of healthcare and authorize for your insurance to be billed (if applicable) for the services provided during this visit. Depending on your insurance coverage, you may receive a charge related to this service.  I need to obtain your verbal consent now. Are you willing to proceed with your visit today? Erica [name] has provided verbal consent on 12/05/2023 for a virtual visit (video or telephone). Bari Learn, FNP  Date: 12/05/2023 3:45 PM   Virtual Visit via Video Note   I, Bari Learn, connected with  Erica [name]  ([id number], [birth year]) on 12/05/23 at  3:40 PM EDT by a video-enabled telemedicine application and verified that I am speaking with the correct person using two identifiers.  Location: Patient: Virtual Visit  Location Patient: Home Provider: Virtual Visit Location Provider: Home Office   I discussed the limitations of evaluation and management by telemedicine and the availability of in person appointments. The patient expressed understanding and agreed to proceed.    History of Present Illness: Erica [name] is a 28 y.o. who identifies as a female who was assigned female at birth, and is being seen today for congestion that started over 10 days ago .  HPI: Sinus Problem This is a new problem. The current episode started 1 to 4 weeks ago. The problem has been gradually worsening since onset. There has been no fever. Her pain is at a severity of 8/10. The pain is mild. Associated symptoms include congestion, ear pain (left), headaches, sinus pressure and sneezing. Pertinent negatives include no chills, coughing, shortness of breath, sore throat or swollen glands. Past treatments include oral decongestants. The treatment provided mild relief.    Problems:  Patient Active Problem List   Diagnosis Date Noted   Class 2 obesity due to excess calories without serious comorbidity with body mass index (BMI) of 37.0 to 37.9 in adult 09/12/2023   Nexplanon  in place 12/16/2018   Postpartum depression 12/31/2017   Monilial vulvovaginitis 05/05/2015   Sickle cell trait (HCC) 03/17/2013    Allergies: No Known Allergies Medications:  Current Outpatient Medications:    amoxicillin -clavulanate (AUGMENTIN ) 875-125 MG tablet, Take 1 tablet by mouth 2 (two) times daily., Disp: 14 tablet, Rfl: 0   fluconazole  (DIFLUCAN ) 150 MG tablet, Take 1 tablet (150  mg total) by mouth every three (3) days as needed., Disp: 3 tablet, Rfl: 0   norethindrone-ethinyl estradiol -FE (BLISOVI FE 1/20) 1-20 MG-MCG tablet, Take 1 tablet by mouth daily., Disp: 84 tablet, Rfl: 4   phentermine  (ADIPEX-P ) 37.5 MG tablet, Take 1 tablet (37.5 mg total) by mouth daily before breakfast., Disp: 30 tablet, Rfl: 0   Semaglutide -Weight  Management (WEGOVY ) 0.25 MG/0.5ML SOAJ, Inject 0.25 mg into the skin once a week., Disp: 2 mL, Rfl: 0   [START ON 12/13/2023] Semaglutide -Weight Management (WEGOVY ) 0.5 MG/0.5ML SOAJ, Inject 0.5 mg into the skin once a week., Disp: 2 mL, Rfl: 0  Observations/Objective: Patient is well-developed, well-nourished in no acute distress.  Resting comfortably  at home.  Head is normocephalic, atraumatic.  No labored breathing.  Speech is clear and coherent with logical content.  Patient is alert and oriented at baseline.  Maxillary pressure   Assessment and Plan: 1. Acute sinusitis, recurrence not specified, unspecified location (Primary) - amoxicillin -clavulanate (AUGMENTIN ) 875-125 MG tablet; Take 1 tablet by mouth 2 (two) times daily.  Dispense: 14 tablet; Refill: 0  - Take meds as prescribed - Use a cool mist humidifier  -Use saline nose sprays frequently -Force fluids -For any cough or congestion  Use plain Mucinex- regular strength or max strength is fine -For fever or aces or pains- take tylenol  or ibuprofen . -Throat lozenges if help -Follow up if symptoms worsen or do not improve   Follow Up Instructions: I discussed the assessment and treatment plan with the patient. The patient was provided an opportunity to ask questions and all were answered. The patient agreed with the plan and demonstrated an understanding of the instructions.  A copy of instructions were sent to the patient via MyChart unless otherwise noted below.     The patient was advised to call back or seek an in-person evaluation if the symptoms worsen or if the condition fails to improve as anticipated.    Bari Learn, FNP

## 2023-12-06 ENCOUNTER — Telehealth: Payer: Self-pay | Admitting: Pharmacist

## 2023-12-06 NOTE — Telephone Encounter (Signed)
 This has already been addressed, please see telephone encounter from 12/04/2023. Will close encounter.

## 2023-12-06 NOTE — Telephone Encounter (Signed)
 Spoke with patient's insurance and as of November 26, 2023, Wegovy  is covered under her plan.  They stated there is a paid claim from [facility] from 12/05/2023.

## 2023-12-14 ENCOUNTER — Other Ambulatory Visit: Payer: Self-pay | Admitting: Adult Health

## 2023-12-18 NOTE — Telephone Encounter (Signed)
 Multiple telephone encounters.  Wegovy  has been approved.

## 2023-12-18 NOTE — Telephone Encounter (Signed)
 OV from 09/14/21, 10/16/21 documenting weight loss with Wegovy . Then was unable to continue wegovy  due to national shortage.

## 2023-12-25 ENCOUNTER — Encounter: Payer: Self-pay | Admitting: Family Medicine

## 2023-12-25 ENCOUNTER — Ambulatory Visit: Admitting: Family Medicine

## 2023-12-25 VITALS — BP 107/74 | HR 80 | Temp 98.0°F | Ht 63.0 in | Wt 212.0 lb

## 2023-12-25 DIAGNOSIS — Z6837 Body mass index (BMI) 37.0-37.9, adult: Secondary | ICD-10-CM | POA: Diagnosis not present

## 2023-12-25 DIAGNOSIS — E6609 Other obesity due to excess calories: Secondary | ICD-10-CM | POA: Diagnosis not present

## 2023-12-25 DIAGNOSIS — E66812 Obesity, class 2: Secondary | ICD-10-CM

## 2023-12-25 DIAGNOSIS — L301 Dyshidrosis [pompholyx]: Secondary | ICD-10-CM | POA: Diagnosis not present

## 2023-12-25 MED ORDER — TRIAMCINOLONE ACETONIDE 0.1 % EX CREA: 1.0000 | TOPICAL_CREAM | Freq: Two times a day (BID) | CUTANEOUS | 0 refills | Status: AC

## 2023-12-25 NOTE — Progress Notes (Signed)
   Established Patient Office Visit  Subjective   Patient ID: Erica [name], female    DOB: 1996-03-27  Age: 28 y.o. MRN: [id number]  Chief Complaint  Patient presents with   Obesity    HPI Erica [name] is her for follow up after starting wegovy . She will take the 4th dose of 0.25 mg tomorrow. She has been working on portion control. She has noticed a decrease in her appetite last week. She has been eating baked chicken, salmon, vegetables, rice, fruits, yogurt smoothies. She is walking on the treadmill for 30 mins to 1 hour daily. She did have nausea when starting wegovy  but this has eased up. She was previously on phentermine  before starting wegovy  but this was discontinued due to weight gain.   Starting weight: 207 Net loss: + 5 lbs  She has noticed dry, cracked skin to her fingers over the last few weeks. She has also been getting small fluid filled blisters. She does wash dishes frequently and clean her hands frequently.   ROS As per HPI.    Objective:     BP 107/74   Pulse 80   Temp 98 F (36.7 C) (Temporal)   Ht 5' 3 (1.6 m)   Wt 212 lb (96.2 kg)   SpO2 99%   BMI 37.55 kg/m  Wt Readings from Last 3 Encounters:  12/25/23 212 lb (96.2 kg)  11/13/23 207 lb 3.2 oz (94 kg)  10/18/23 206 lb 9.6 oz (93.7 kg)      Physical Exam Vitals and nursing note reviewed.  Constitutional:      General: She is not in acute distress.    Appearance: She is obese. She is not ill-appearing, toxic-appearing or diaphoretic.  Musculoskeletal:     Right lower leg: No edema.     Left lower leg: No edema.  Skin:    General: Skin is warm and dry.     Findings: Rash (dry, cracked skin to bilateral fingers) present.  Neurological:     General: No focal deficit present.     Mental Status: She is alert and oriented to person, place, and time.  Psychiatric:        Mood and Affect: Mood normal.        Behavior: Behavior normal.        Thought Content: Thought content normal.         Judgment: Judgment normal.      No results found for any visits on 12/25/23.    The ASCVD Risk score (Arnett DK, et al., 2019) failed to calculate for the following reasons:   The 2019 ASCVD risk score is only valid for ages 69 to 1    Assessment & Plan:   Erica [name] was seen today for obesity.  Diagnoses and all orders for this visit:  Class 2 obesity due to excess calories without serious comorbidity with body mass index (BMI) of 37.0 to 37.9 in adult On starting dose. Will increase to 0.5 mg next week x 4 weeks. Discussed diet, exercise, mitigating side effects of wegovy .   Dyshidrotic eczema Wear gloves when cleaning, washing dishes. Used kenalog  BID prn.  -     triamcinolone  cream (KENALOG ) 0.1 %; Apply 1 Application topically 2 (two) times daily.    Return in about 4 weeks (around 01/22/2024) for medication follow up.   The patient indicates understanding of these issues and agrees with the plan.  Erica CHRISTELLA Search, FNP

## 2023-12-25 NOTE — Patient Instructions (Signed)
 Itchy Skin Blisters (Dyshidrotic Eczema): What to Know Dyshidrotic eczema, also called pompholyx, is a type of skin condition. It causes very itchy blisters on the hands and feet. It's more common before age 30, but it can affect people of any age. There's no cure, but treatments can help relieve symptoms. What are the causes? The cause of this condition isn't known. What increases the risk? You're more likely to get this condition if: You wash your hands a lot. You have a personal or family history of eczema, allergies, asthma, or hay fever. You have allergies to metals like nickel or cobalt. You work with skin irritants like detergents or cement. You smoke. You're often stressed. You have an immune system condition. What are the signs or symptoms? Symptoms may come and go and often affect your hands or feet. Symptoms include: Severe itching, often before blisters show up. Blisters that can form suddenly. At first, the blisters may form near the fingertips. In severe cases, blisters may grow to large blister masses. Blisters go away in 2-3 weeks. This is followed by a less itchy and dry phase. Pain and swelling. Cracks or long, narrow openings (fissures) in the skin. Severe dryness. Ridges on the nails. How is this diagnosed? This condition may be diagnosed based on: Symptoms. Physical exam. Medical history. Skin scrapings to rule out fungal infections. Testing a swab of fluid for bacteria. A biopsy. A small piece of skin is removed and checked for infection or to rule out other conditions. Skin patch tests that use allergen patches on your back to check for allergic reactions. You may need to see a skin specialist called a dermatologist. This specialist can help diagnose and treat this condition. How is this treated? There's no cure for this condition, but treatment can help relieve symptoms. Your health care provider may suggest: Avoiding allergens, irritants, or triggers that  make your symptoms worse. You may need to: Use different soaps or lotions. Avoid hot weather or places where you'll sweat a lot. Learn stress management techniques, like relaxation and exercise. Follow diet changes that your provider recommends. Soothing your skin by: Using a clean, damp towel on the affected area. Taking baths with a special salt called aluminum acetate. Medicines such as: Medicine to lessen itching (antihistamines). Medicine to put on your skin to lessen swelling and irritation (corticosteroid creams or ointments). Immunosuppressant medicines. These are prescribed in severe cases. Antibiotic medicine if there's a skin infection. Light therapy, also called phototherapy. This is where you put your affected skin under ultraviolet (UV) light to lessen itchiness and inflammation. Follow these instructions at home: Bathing and skin care  Wash your skin gently. After bathing or washing your hands, pat your skin dry. Avoid rubbing. Take off your jewelry before bathing. If your skin under the jewelry stays wet, blisters may form or get worse. Apply cool compresses as told by your provider. To do this: Soak a clean towel in cool water. Squeeze out the water until the towel is damp. Place the towel on the affected skin for 20 minutes, 2-3 times a day. Let the water partially air dry, and then put on lotion. Use mild soaps, cleaners, and lotions that don't contain dyes, perfumes, or irritants. Keep your skin hydrated. To do this: Take warm baths or showers. Avoid hot water. Put on lotion within 3 minutes of bathing to lock in moisture. Medicines Take or apply your medicines only as told. If you were given antibiotics, take or apply them as told.  Do not stop using them even if you start to feel better. General instructions Use skin creams or lotions as told. Avoid triggers and allergens that make your symptoms worse. Keep fingernails short to avoid scratching open the  skin. Use waterproof gloves to protect your hands when doing work that keeps your hands wet for a long time. Limit how often you wear socks or shoes. If you have to wear socks, wear cotton socks that absorb moisture. Choose loose-fitting shoes. Do not smoke, vape, or use nicotine or tobacco. Keep all follow-up visits to make sure your treatment plan is working. Contact a health care provider if: You have symptoms that don't go away. You have signs of infection, such as: Crusting, pus, or a bad smell. More redness, swelling, or pain. More warmth in the affected area. Your skin has red streaks that are painful. This information is not intended to replace advice given to you by your health care provider. Make sure you discuss any questions you have with your health care provider. Document Revised: 10/16/2022 Document Reviewed: 10/16/2022 Elsevier Patient Education  2024 [employer].

## 2024-01-21 ENCOUNTER — Telehealth: Payer: Self-pay | Admitting: Family Medicine

## 2024-01-21 NOTE — Telephone Encounter (Signed)
 Copied from CRM #[id number]. Topic: Clinical - Medication Question >> Jan 21, 2024 11:08 AM Roselie BROCKS wrote: Reason for CRM: Patient would like a return phone  call form nurse or doctor concerning medication

## 2024-01-21 NOTE — Telephone Encounter (Signed)
 Answered all questions for pt

## 2024-01-30 ENCOUNTER — Other Ambulatory Visit: Payer: Self-pay | Admitting: Family Medicine

## 2024-01-30 DIAGNOSIS — E6609 Other obesity due to excess calories: Secondary | ICD-10-CM

## 2024-02-07 ENCOUNTER — Encounter: Payer: Self-pay | Admitting: Family Medicine

## 2024-02-07 ENCOUNTER — Ambulatory Visit: Admitting: Family Medicine

## 2024-02-07 VITALS — BP 112/70 | HR 79 | Temp 98.2°F | Ht 63.0 in | Wt 209.4 lb

## 2024-02-07 DIAGNOSIS — E66812 Obesity, class 2: Secondary | ICD-10-CM

## 2024-02-07 DIAGNOSIS — Z6837 Body mass index (BMI) 37.0-37.9, adult: Secondary | ICD-10-CM | POA: Diagnosis not present

## 2024-02-07 DIAGNOSIS — E6609 Other obesity due to excess calories: Secondary | ICD-10-CM | POA: Diagnosis not present

## 2024-02-07 DIAGNOSIS — R110 Nausea: Secondary | ICD-10-CM | POA: Diagnosis not present

## 2024-02-07 MED ORDER — ONDANSETRON HCL 4 MG PO TABS: 4.0000 mg | ORAL_TABLET | Freq: Three times a day (TID) | ORAL | 1 refills | Status: DC | PRN

## 2024-02-07 NOTE — Progress Notes (Signed)
 Established Patient Office Visit  Subjective   Patient ID: Erica [name], female    DOB: [birth year]  Age: 28 y.o. MRN: [id number]  Chief Complaint  Patient presents with   Obesity    HPI  History of Present Illness   Erica [name] is a 28 year old female who presents for follow-up on Wegovy  treatment.  Wegovy  (semaglutide ) therapy - Currently on the second box of 0.5 mg Wegovy , with two doses completed - Has lost three pounds since the last visit - Diet has improved with healthier food choices and reduced snacking, attributed to medication-induced nausea  Gastrointestinal symptoms - Persistent, constant nausea since starting Wegovy  - Single episode of vomiting - No abdominal pain, constipation, or diarrhea - Nausea has slightly improved but remains present  Impact on physical activity and daily function - Persistent nausea has decreased motivation for physical activity - Difficulty maintaining exercise routine, particularly due to childcare responsibilities and nausea       ROS As per HPI.    Objective:     BP 112/70   Pulse 79   Temp 98.2 F (36.8 C) (Temporal)   Ht 5' 3 (1.6 m)   Wt 209 lb 6.4 oz (95 kg)   SpO2 100%   BMI 37.09 kg/m  Wt Readings from Last 3 Encounters:  02/07/24 209 lb 6.4 oz (95 kg)  12/25/23 212 lb (96.2 kg)  11/13/23 207 lb 3.2 oz (94 kg)      Physical Exam Vitals and nursing note reviewed.  Constitutional:      General: She is not in acute distress.    Appearance: She is obese. She is not ill-appearing, toxic-appearing or diaphoretic.  Musculoskeletal:     Right lower leg: No edema.     Left lower leg: No edema.  Skin:    General: Skin is warm and dry.  Neurological:     General: No focal deficit present.     Mental Status: She is alert and oriented to person, place, and time.  Psychiatric:        Mood and Affect: Mood normal.        Behavior: Behavior normal.      No results found for any visits on  02/07/24.    The ASCVD Risk score (Arnett DK, et al., 2019) failed to calculate for the following reasons:   The 2019 ASCVD risk score is only valid for ages 52 to 58    Assessment & Plan:   Reya was seen today for obesity.  Diagnoses and all orders for this visit:  Class 2 obesity due to excess calories without serious comorbidity with body mass index (BMI) of 37.0 to 37.9 in adult  Nausea -     ondansetron  (ZOFRAN ) 4 MG tablet; Take 1 tablet (4 mg total) by mouth every 8 (eight) hours as needed for nausea or vomiting.      Obesity, class 2 Weight loss of 3 pounds on Wegovy  0.5 mg with significant nausea. Patient prefers to continue current dosage due side effects - Continue Wegovy  0.5 mg. - Discuss potential dosage adjustment in a few weeks based on tolerance and symptom management. - Message provider via MyChart in a couple of weeks after completing the current box of Wegovy  to discuss symptoms and potential dosage adjustment.  Medication-induced nausea due to Wegovy  Persistent nausea with some improvement, no vomiting or gastrointestinal symptoms. Nausea affects exercise and daily activities. - Prescribe Zofran  every 8 hours as needed for nausea. -  Evaluate effectiveness of Zofran . - Reassess nausea and medication tolerance in a few weeks.      Return in about 6 weeks (around 03/20/2024) for medication follow up.   The patient indicates understanding of these issues and agrees with the plan.  Annabella CHRISTELLA Search, FNP

## 2024-02-10 ENCOUNTER — Other Ambulatory Visit: Payer: Self-pay | Admitting: Family Medicine

## 2024-02-10 DIAGNOSIS — E6609 Other obesity due to excess calories: Secondary | ICD-10-CM

## 2024-02-18 ENCOUNTER — Encounter: Payer: Self-pay | Admitting: Adult Health

## 2024-02-18 ENCOUNTER — Other Ambulatory Visit (HOSPITAL_COMMUNITY)
Admission: RE | Admit: 2024-02-18 | Discharge: 2024-02-18 | Disposition: A | Discharge status: Home / Self Care | Source: Ambulatory Visit | Attending: Adult Health | Admitting: Adult Health

## 2024-02-18 ENCOUNTER — Ambulatory Visit (INDEPENDENT_AMBULATORY_CARE_PROVIDER_SITE_OTHER): Admitting: Adult Health

## 2024-02-18 VITALS — BP 119/88 | HR 87 | Ht 63.0 in | Wt 205.0 lb

## 2024-02-18 DIAGNOSIS — Z3202 Encounter for pregnancy test, result negative: Secondary | ICD-10-CM | POA: Diagnosis not present

## 2024-02-18 DIAGNOSIS — N898 Other specified noninflammatory disorders of vagina: Secondary | ICD-10-CM | POA: Insufficient documentation

## 2024-02-18 DIAGNOSIS — Z01419 Encounter for gynecological examination (general) (routine) without abnormal findings: Secondary | ICD-10-CM

## 2024-02-18 DIAGNOSIS — Z113 Encounter for screening for infections with a predominantly sexual mode of transmission: Secondary | ICD-10-CM | POA: Insufficient documentation

## 2024-02-18 DIAGNOSIS — K648 Other hemorrhoids: Secondary | ICD-10-CM | POA: Diagnosis not present

## 2024-02-18 DIAGNOSIS — Z3041 Encounter for surveillance of contraceptive pills: Secondary | ICD-10-CM | POA: Insufficient documentation

## 2024-02-18 LAB — POCT URINE PREGNANCY: Preg Test, Ur: NEGATIVE

## 2024-02-18 MED ORDER — NORETHIN ACE-ETH ESTRAD-FE 1-20 MG-MCG PO TABS: 1.0000 | ORAL_TABLET | Freq: Every day | ORAL | 4 refills | Status: AC

## 2024-02-18 NOTE — Progress Notes (Signed)
 Patient ID: Erica [name], female   DOB: Apr 11, 1996, 28 y.o.   MRN: [id number] History of Present Illness: Erica [name] is a 28 year old black female,separated, in for a well woman gyn exam and wasnts STD testing and has hemorrhoid.     Component Value Date/Time   DIAGPAP  09/27/2022 1421    - Negative for intraepithelial lesion or malignancy (NILM)   DIAGPAP  12/16/2018 0000    NEGATIVE FOR INTRAEPITHELIAL LESIONS OR MALIGNANCY.   DIAGPAP  04/26/2016 0000    NEGATIVE FOR INTRAEPITHELIAL LESIONS OR MALIGNANCY.   HPVHIGH Negative 09/27/2022 1421   ADEQPAP  09/27/2022 1421    Satisfactory for evaluation; transformation zone component PRESENT.   ADEQPAP  12/16/2018 0000    Satisfactory for evaluation  endocervical/transformation zone component PRESENT.   ADEQPAP  04/26/2016 0000    Satisfactory for evaluation  endocervical/transformation zone component PRESENT.    PCP is Annabella Search NP  Current Medications, Allergies, Past Medical History, Past Surgical History, Family History and Social History were reviewed in [employer] record.     Review of Systems: Patient denies any headaches, hearing loss, fatigue, blurred vision, shortness of breath, chest pain, abdominal pain, problems with bowel movements, urination, or intercourse. No joint pain or mood swings.  Had blood in BM saw PCP has internal hemorrhoid, noticed fishy odor from rectal area    Physical Exam:BP 119/88 (BP Location: Right Arm, Patient Position: Sitting, Cuff Size: Large)   Pulse 87   Ht 5' 3 (1.6 m)   Wt 205 lb (93 kg)   LMP 01/09/2024 (Approximate)   BMI 36.31 kg/m  UPT negative  General:  Well developed, well nourished, no acute distress Skin:  Warm and dry Neck:  Midline trachea, normal thyroid, good ROM, no lymphadenopathy Lungs; Clear to auscultation bilaterally Breast:  No dominant palpable mass, retraction, or nipple discharge Cardiovascular: Regular rate and rhythm Abdomen:   Soft, non tender, no hepatosplenomegaly Pelvic:  External genitalia is normal in appearance, no lesions.  The vagina is normal in appearance, has white discharge without odor. Urethra has no lesions or masses. The cervix is bulbous.  Uterus is felt to be normal size, shape, and contour.  No adnexal masses or tenderness noted.Bladder is non tender, no masses felt.CV swab obtained. Rectal: Good sphincter tone, + internal hemorrhoid felt.  Extremities/musculoskeletal:  No swelling or varicosities noted, no clubbing or cyanosis Psych:  No mood changes, alert and cooperative,seems happy AA is 1 Fall risk is low    02/18/2024    1:28 PM 02/07/2024    9:45 AM 12/25/2023    8:57 AM  Depression screen PHQ 2/9  Decreased Interest 0 0 0  Down, Depressed, Hopeless 0 0 0  PHQ - 2 Score 0 0 0  Altered sleeping 0 0 0  Tired, decreased energy 0 0 0  Change in appetite 0 0 0  Feeling bad or failure about yourself  0 0 0  Trouble concentrating 0 0 0  Moving slowly or fidgety/restless 0 0 0  Suicidal thoughts 0 0 0  PHQ-9 Score 0 0 0  Difficult doing work/chores  Not difficult at all Not difficult at all       02/18/2024    1:28 PM 02/07/2024    9:46 AM 12/25/2023    8:58 AM 11/13/2023    9:20 AM  GAD 7 : Generalized Anxiety Score  Nervous, Anxious, on Edge 0 0 0 0  Control/stop worrying 0 0 0 0  Worry too much - different things 0 0 0 0  Trouble relaxing 0 0 0 0  Restless 0 0 0 0  Easily annoyed or irritable 0 0 0 0  Afraid - awful might happen 0 0 0 0  Total GAD 7 Score 0 0 0 0  Anxiety Difficulty  Not difficult at all Not difficult at all Not difficult at all    Upstream - 02/18/24 1339       Pregnancy Intention Screening   Does the patient want to become pregnant in the next year? No    Does the patient's partner want to become pregnant in the next year? No    Would the patient like to discuss contraceptive options today? No      Contraception Wrap Up   Current Method Oral Contraceptive     End Method Oral Contraceptive    Contraception Counseling Provided Yes           Examination chaperoned by Clarita Salt LPN   Impression and plan: 1. Negative pregnancy test - POCT urine pregnancy  2. Encounter for well woman exam with routine gynecological exam (Primary) Physical in 1 year Pap in 2027 Labs with PCP  3. Vaginal discharge CV swab sent  - Cervicovaginal ancillary only( [place])  4. Screening examination for STD (sexually transmitted disease) CV swab sent GC/CHL,trich,BV and yeast  - Cervicovaginal ancillary only( )  5. Internal hemorrhoid Try preparation  H or Anusol cream  Can refer for banding if desires   6. Encounter for surveillance of contraceptive pills Happy with blisovi, will refill Meds ordered this encounter  Medications   norethindrone-ethinyl estradiol -FE (BLISOVI FE 1/20) 1-20 MG-MCG tablet    Sig: Take 1 tablet by mouth daily.    Dispense:  84 tablet    Refill:  4    Supervising Provider:   JAYNE MINDER H [[id number]]

## 2024-02-20 ENCOUNTER — Ambulatory Visit

## 2024-02-20 ENCOUNTER — Ambulatory Visit: Payer: Self-pay | Admitting: Adult Health

## 2024-02-20 LAB — CERVICOVAGINAL ANCILLARY ONLY
Bacterial Vaginitis (gardnerella): NEGATIVE
Candida Glabrata: NEGATIVE
Candida Vaginitis: POSITIVE — AB
Chlamydia: NEGATIVE
Comment: NEGATIVE
Comment: NEGATIVE
Comment: NEGATIVE
Comment: NEGATIVE
Comment: NEGATIVE
Comment: NORMAL
Neisseria Gonorrhea: NEGATIVE
Trichomonas: NEGATIVE

## 2024-02-20 MED ORDER — FLUCONAZOLE 150 MG PO TABS: ORAL_TABLET | ORAL | 1 refills | Status: DC

## 2024-02-21 ENCOUNTER — Ambulatory Visit: Admitting: Family Medicine

## 2024-02-21 ENCOUNTER — Ambulatory Visit: Payer: Self-pay

## 2024-02-21 NOTE — Telephone Encounter (Signed)
 Copied from CRM [id number]. Topic: Clinical - Red Word Triage >> Feb 21, 2024  8:40 AM Avram MATSU wrote: Red Word that prompted transfer to Nurse Triage: blood and dark urine/pain pt stated she has a uti

## 2024-02-21 NOTE — Telephone Encounter (Signed)
 Noted, pt aware she has to have visit either virtual or in person for UTI.

## 2024-02-21 NOTE — Telephone Encounter (Signed)
 FYI Only or Action Required?: FYI only for provider.  Patient was last seen in primary care on 02/21/2024 by Joesph Annabella HERO, FNP.  Called Nurse Triage reporting Urinary Tract Infection.  Symptoms began several days ago.  Interventions attempted: Nothing.  Symptoms are: unchanged.  Triage Disposition: See Physician Within 24 Hours  Patient/caregiver understands and will follow disposition?: No         Reason for Disposition  Blood in urine (red, pink, or tea-colored)  Answer Assessment - Initial Assessment Questions Pt becaume upset when this NT advised her that she will need an appt for evaluation. Pt wanting to go to [facility] to give sample and abx called in later. Pt wanted to speak to the office nurse. Call transferred to [facility].       1. SEVERITY: How bad is the pain?  (e.g., Scale 1-10; mild, moderate, or severe)    Mild to  moderate 7. CAUSE: What do you think is causing the painful urination?  (e.g., UTI, scratch, Herpes sore)     UTI 8. OTHER SYMPTOMS: Do you have any other symptoms? (e.g., blood in urine, flank pain, genital sores, urgency, vaginal discharge)     Blood in urine  Protocols used: Urination Pain - Female-A-AH

## 2024-02-24 ENCOUNTER — Encounter: Payer: Self-pay | Admitting: Family Medicine

## 2024-02-24 MED ORDER — WEGOVY 1 MG/0.5ML ~~LOC~~ SOAJ: 1.0000 mg | SUBCUTANEOUS | 2 refills | Status: DC

## 2024-03-04 ENCOUNTER — Ambulatory Visit (INDEPENDENT_AMBULATORY_CARE_PROVIDER_SITE_OTHER): Admitting: *Deleted

## 2024-03-04 DIAGNOSIS — R300 Dysuria: Secondary | ICD-10-CM | POA: Diagnosis not present

## 2024-03-04 DIAGNOSIS — R3911 Hesitancy of micturition: Secondary | ICD-10-CM

## 2024-03-04 LAB — POCT URINALYSIS DIPSTICK
Glucose, UA: NEGATIVE
Ketones, UA: NEGATIVE
Nitrite, UA: NEGATIVE
Protein, UA: NEGATIVE

## 2024-03-04 NOTE — Progress Notes (Signed)
   NURSE VISIT- UTI SYMPTOMS   SUBJECTIVE:  Erica [name] is a 28 y.o. [phone] female here for UTI symptoms. She is a GYN patient. She reports dysuria and urinary hesitancy.  OBJECTIVE:  LMP 01/09/2024 (Approximate)   Appears well, in no apparent distress  No results found for this or any previous visit (from the past 24 hours).  ASSESSMENT: GYN patient with UTI symptoms and negative nitrites  PLAN: Note routed to Delon Lewis, AGNP   Rx sent by provider today: No Urine culture sent Call or return to clinic prn if these symptoms worsen or fail to improve as anticipated. Follow-up: as needed   Alan LITTIE Fischer  03/04/2024 4:07 PM

## 2024-03-05 LAB — URINALYSIS
Bilirubin, UA: NEGATIVE
Glucose, UA: NEGATIVE
Ketones, UA: NEGATIVE
Nitrite, UA: NEGATIVE
Protein,UA: NEGATIVE
Specific Gravity, UA: 1.012 (ref 1.005–1.030)
Urobilinogen, Ur: 0.2 mg/dL (ref 0.2–1.0)
pH, UA: 6.5 (ref 5.0–7.5)

## 2024-03-06 ENCOUNTER — Ambulatory Visit: Payer: Self-pay | Admitting: Adult Health

## 2024-03-06 LAB — URINE CULTURE

## 2024-03-08 ENCOUNTER — Other Ambulatory Visit: Payer: Self-pay | Admitting: Women's Health

## 2024-03-20 ENCOUNTER — Ambulatory Visit: Admitting: Family Medicine

## 2024-03-20 ENCOUNTER — Encounter: Payer: Self-pay | Admitting: Family Medicine

## 2024-03-20 VITALS — BP 109/76 | HR 85 | Temp 98.3°F | Ht 63.0 in | Wt 203.4 lb

## 2024-03-20 DIAGNOSIS — E6609 Other obesity due to excess calories: Secondary | ICD-10-CM

## 2024-03-20 DIAGNOSIS — Z6836 Body mass index (BMI) 36.0-36.9, adult: Secondary | ICD-10-CM

## 2024-03-20 DIAGNOSIS — E66812 Obesity, class 2: Secondary | ICD-10-CM | POA: Diagnosis not present

## 2024-03-20 MED ORDER — WEGOVY 1.7 MG/0.75ML ~~LOC~~ SOAJ: 1.7000 mg | SUBCUTANEOUS | 0 refills | Status: AC

## 2024-03-20 NOTE — Progress Notes (Signed)
 Established Patient Office Visit  Subjective   Patient ID: Erica [name], female    DOB: 1995/09/16  Age: 28 y.o. MRN: [id number]  Chief Complaint  Patient presents with   Obesity    HPI  History of Present Illness   Erica [name] is a 28 year old female who presents for follow-up on Wegovy  treatment for weight management.  Pharmacologic weight management - Currently receiving Wegovy  1 mg weekly for weight management - Completed four injections at 1 mg to date - Six-pound weight loss since initiation of therapy. Starting weight 209 lb - Pleased with progress  Gastrointestinal side effects - Occasional nausea, improved compared to earlier in treatment - Nausea typically occurs with overindulgence in snacks - Zofran  used as needed for persistent nausea, with good effect - No need for Zofran  refill at this time  Diet and exercise - Maintains a healthy diet - Experiences nausea when overindulging in snacks, which reinforces dietary discipline - Continues to exercise by walking on a treadmill for at least 30 minutes per session - Busier schedule has limited exercise time        ROS As per HPI.    Objective:     BP 109/76   Pulse 85   Temp 98.3 F (36.8 C) (Temporal)   Ht 5' 3 (1.6 m)   Wt 203 lb 6.4 oz (92.3 kg)   SpO2 99%   BMI 36.03 kg/m  Wt Readings from Last 3 Encounters:  03/20/24 203 lb 6.4 oz (92.3 kg)  02/18/24 205 lb (93 kg)  02/07/24 209 lb 6.4 oz (95 kg)      Physical Exam Vitals and nursing note reviewed.  Constitutional:      General: She is not in acute distress.    Appearance: She is obese. She is not ill-appearing, toxic-appearing or diaphoretic.  Cardiovascular:     Rate and Rhythm: Normal rate and regular rhythm.     Heart sounds: Normal heart sounds. No murmur heard. Pulmonary:     Effort: Pulmonary effort is normal. No respiratory distress.     Breath sounds: Normal breath sounds.  Musculoskeletal:     Right lower  leg: No edema.     Left lower leg: No edema.  Skin:    General: Skin is warm and dry.  Neurological:     General: No focal deficit present.     Mental Status: She is alert and oriented to person, place, and time.  Psychiatric:        Mood and Affect: Mood normal.        Behavior: Behavior normal.      No results found for any visits on 03/20/24.    The ASCVD Risk score (Arnett DK, et al., 2019) failed to calculate for the following reasons:   The 2019 ASCVD risk score is only valid for ages 68 to 46    Assessment & Plan:   Vernell was seen today for obesity.  Diagnoses and all orders for this visit:  Class 2 obesity due to excess calories without serious comorbidity with body mass index (BMI) of 36.0 to 36.9 in adult -     semaglutide -weight management (WEGOVY ) 1.7 MG/0.75ML SOAJ SQ injection; Inject 1.7 mg into the skin once a week.  Assessment and Plan    Obesity Obesity management with Wegovy  is ongoing. She has lost 6 pounds, indicating progress. - Increase Wegovy  dosage to 1.7 mg - Notify when on second or third injection of 1.7 mg to  send in 2.4 mg maintenance dose. - Continue Zofran  as needed for nausea. - Encourage healthy diet and exercise regimen, including 30 minutes treadmill walking. - Schedule follow-up in six weeks to assess progress.  General Health Maintenance Recommended daily women's multivitamin to ensure adequate nutrient intake. - Recommend daily women's multivitamin including B vitamins, vitamin D, iron, and biotin.       Return in about 6 weeks (around 05/01/2024) for medication follow up.   The patient indicates understanding of these issues and agrees with the plan.  Annabella CHRISTELLA Search, FNP

## 2024-04-01 ENCOUNTER — Encounter: Payer: Self-pay | Admitting: Family Medicine

## 2024-04-01 DIAGNOSIS — E66812 Obesity, class 2: Secondary | ICD-10-CM

## 2024-04-03 MED ORDER — WEGOVY 2.4 MG/0.75ML ~~LOC~~ SOAJ: 2.4000 mg | SUBCUTANEOUS | 6 refills | Status: AC

## 2024-05-04 ENCOUNTER — Ambulatory Visit: Payer: Self-pay | Admitting: Family Medicine

## 2024-05-15 ENCOUNTER — Ambulatory Visit: Admitting: Family Medicine

## 2024-05-15 ENCOUNTER — Encounter: Payer: Self-pay | Admitting: Family Medicine

## 2024-05-15 VITALS — BP 104/70 | HR 85 | Temp 97.9°F | Ht 63.0 in | Wt 193.8 lb

## 2024-05-15 DIAGNOSIS — Z6836 Body mass index (BMI) 36.0-36.9, adult: Secondary | ICD-10-CM

## 2024-05-15 DIAGNOSIS — E6609 Other obesity due to excess calories: Secondary | ICD-10-CM | POA: Diagnosis not present

## 2024-05-15 DIAGNOSIS — R21 Rash and other nonspecific skin eruption: Secondary | ICD-10-CM

## 2024-05-15 DIAGNOSIS — E66812 Obesity, class 2: Secondary | ICD-10-CM | POA: Diagnosis not present

## 2024-05-15 DIAGNOSIS — R110 Nausea: Secondary | ICD-10-CM | POA: Diagnosis not present

## 2024-05-15 DIAGNOSIS — R12 Heartburn: Secondary | ICD-10-CM | POA: Diagnosis not present

## 2024-05-15 MED ORDER — CLOTRIMAZOLE-BETAMETHASONE 1-0.05 % EX CREA: 1.0000 | TOPICAL_CREAM | Freq: Two times a day (BID) | CUTANEOUS | 0 refills | Status: AC

## 2024-05-15 MED ORDER — FAMOTIDINE 20 MG PO TABS: 20.0000 mg | ORAL_TABLET | Freq: Two times a day (BID) | ORAL | 1 refills | Status: AC | PRN

## 2024-05-15 MED ORDER — ONDANSETRON HCL 4 MG PO TABS: 4.0000 mg | ORAL_TABLET | Freq: Three times a day (TID) | ORAL | 1 refills | Status: AC | PRN

## 2024-05-15 NOTE — Progress Notes (Signed)
 "  Established Patient Office Visit  Subjective   Patient ID: Erica [name], female    DOB: [birth year]  Age: 28 y.o. MRN: [id number]  Chief Complaint  Patient presents with   Obesity    HPI  History of Present Illness   Erica [name] is a 28 year old female who presents with weight management and gastrointestinal side effects from medication.  Weight loss and lifestyle modification - Weight loss of 10 pounds since last visit at the end of October, totaling 16 pounds overall - BMI decreased from 36 to 34 since starting wegovy   - Diet includes fruits, vegetables, lean meats, and whole grains, minimal diary. Has reduced intake of red meats, sweets - Exercises on a treadmill for approximately 45 minutes daily  Gastrointestinal adverse effects from medication - Currently on 2.4 mg injection regimen, administered in the evening - Has gastrointestinal symptoms on day of injection, including vomiting and diarrhea occurring a few hours post-injection, lasting about one hour - Experiences sulfur burps and heartburn on day of injection - Nausea medication effective in preventing nausea  Recurrent intertriginous rash - Recurring rash on the inner thighs, described as 'jock itch' - Rash consists of tiny bumps, sometimes red and itchy - Responds to nystatin  cream, used for 2-3 days until rash subsides - Rash recurs every few weeks - Running low on nystatin  cream        ROS As per HPI.    Objective:     BP 104/70   Pulse 85   Temp 97.9 F (36.6 C) (Temporal)   Ht 5' 3 (1.6 m)   Wt 193 lb 12.8 oz (87.9 kg)   SpO2 98%   BMI 34.33 kg/m  Wt Readings from Last 3 Encounters:  05/15/24 193 lb 12.8 oz (87.9 kg)  03/20/24 203 lb 6.4 oz (92.3 kg)  02/18/24 205 lb (93 kg)      Physical Exam Vitals and nursing note reviewed.  Constitutional:      General: Erica [name] is not in acute distress.    Appearance: Erica [name] is obese. Erica [name] is not ill-appearing, toxic-appearing or  diaphoretic.  Cardiovascular:     Rate and Rhythm: Normal rate.  Pulmonary:     Effort: Pulmonary effort is normal. No respiratory distress.  Musculoskeletal:     Right lower leg: No edema.     Left lower leg: No edema.  Skin:    General: Skin is warm and dry.     Findings: Rash (flesh colored small papules to bilateral medial upper thighs) present.  Neurological:     General: No focal deficit present.     Mental Status: Erica [name] is alert and oriented to person, place, and time.  Psychiatric:        Mood and Affect: Mood normal.        Behavior: Behavior normal.      No results found for any visits on 05/15/24.    The ASCVD Risk score (Arnett DK, et al., 2019) failed to calculate for the following reasons:   The 2019 ASCVD risk score is only valid for ages 39 to 35   * - Cholesterol units were assumed    Assessment & Plan:   Erica [name] was seen today for obesity.  Diagnoses and all orders for this visit:  Class 2 obesity due to excess calories without serious comorbidity with body mass index (BMI) of 36.0 to 36.9 in adult  Heartburn -     famotidine  (PEPCID ) 20 MG tablet; Take 1  tablet (20 mg total) by mouth 2 (two) times daily as needed for heartburn or indigestion.  Rash -     clotrimazole -betamethasone  (LOTRISONE ) cream; Apply 1 Application topically 2 (two) times daily for 14 days.  Nausea -     ondansetron  (ZOFRAN ) 4 MG tablet; Take 1 tablet (4 mg total) by mouth every 8 (eight) hours as needed for nausea or vomiting.   Assessment and Plan    Class 2 obesity BMI reduced from 36 to 34, indicating progress. Current weight loss is 16 pounds since last visit. - Continue Wegovy  2.4 mg injections. - Continue well balanced diet - Encouraged 45-minute daily treadmill workouts.  Heartburn Intermittent heartburn. Pepcid  prescribed for symptom management. - Prescribed Pepcid  twice daily as needed. - Advised daily use if necessary.  Recurrent rash (folliculitis vs.  candidiasis) Recurrent rash on thighs. Nystatin  cream provides temporary relief, suggesting fungal component. - Prescribed lotrisome cream twice daily for 14 days.  Nausea due to medication Nausea associated with Wegovy  injections, managed with Zofran . - Refilled Zofran  prescription.        Return in about 6 weeks (around 06/26/2024) for medication follow up.   The patient indicates understanding of these issues and agrees with the plan.  Erica CHRISTELLA Search, FNP "

## 2024-06-26 ENCOUNTER — Encounter: Payer: Self-pay | Admitting: Family Medicine

## 2024-06-26 ENCOUNTER — Ambulatory Visit: Admitting: Family Medicine

## 2024-06-26 VITALS — BP 110/74 | HR 93 | Temp 97.8°F | Ht 63.0 in | Wt 190.0 lb

## 2024-06-26 DIAGNOSIS — K219 Gastro-esophageal reflux disease without esophagitis: Secondary | ICD-10-CM | POA: Diagnosis not present

## 2024-06-26 DIAGNOSIS — Z6836 Body mass index (BMI) 36.0-36.9, adult: Secondary | ICD-10-CM

## 2024-06-26 DIAGNOSIS — E6609 Other obesity due to excess calories: Secondary | ICD-10-CM | POA: Diagnosis not present

## 2024-06-26 DIAGNOSIS — E66812 Obesity, class 2: Secondary | ICD-10-CM | POA: Diagnosis not present

## 2024-06-26 NOTE — Progress Notes (Signed)
 "  Established Patient Office Visit  Subjective   Patient ID: Erica [name], female    DOB: [birth year]  Age: 29 y.o. MRN: [id number]  Chief Complaint  Patient presents with   Obesity    HPI  History of Present Illness   Erica [name] is a 29 year old female who presents for follow-up on Wegovy  treatment.  Weight management and appetite - Currently on Wegovy  2.4 mg, one week overdue for injection due to financial constraints - Total weight loss of 19 pounds since starting treatment, with BMI reduction from 36 to 33 - Additional 3-pound weight loss since last visit despite missed dose - Sustained reduced appetite and smaller portion sizes, now habitual - No nighttime snacking  Diet and exercise - Diet includes fruits, vegetables, lean meats, whole grains, and limited dairy - Reduced intake of red meats and sweets - Exercise routine includes 45 minutes of treadmill walking and bodyweight exercises at home using YouTube videos daily  Gastrointestinal symptoms - Improved acid reflux symptoms with famotidine  20 mg twice daily - Occasional mild acid reflux symptoms, manageable - Zofran  available for nausea as needed        ROS As per HPI.    Objective:     BP 110/74   Pulse 93   Temp 97.8 F (36.6 C) (Temporal)   Ht 5' 3 (1.6 m)   Wt 190 lb (86.2 kg)   SpO2 99%   BMI 33.66 kg/m  Wt Readings from Last 3 Encounters:  06/26/24 190 lb (86.2 kg)  05/15/24 193 lb 12.8 oz (87.9 kg)  03/20/24 203 lb 6.4 oz (92.3 kg)      Physical Exam Vitals and nursing note reviewed.  Constitutional:      General: She is not in acute distress.    Appearance: She is obese. She is not ill-appearing, toxic-appearing or diaphoretic.  Cardiovascular:     Rate and Rhythm: Normal rate and regular rhythm.     Heart sounds: Normal heart sounds. No murmur heard. Pulmonary:     Effort: Pulmonary effort is normal. No respiratory distress.     Breath sounds: Normal breath  sounds.  Musculoskeletal:     Right lower leg: No edema.     Left lower leg: No edema.  Skin:    General: Skin is warm and dry.  Neurological:     General: No focal deficit present.     Mental Status: She is alert and oriented to person, place, and time.  Psychiatric:        Mood and Affect: Mood normal.        Behavior: Behavior normal.      No results found for any visits on 06/26/24.    The ASCVD Risk score (Arnett DK, et al., 2019) failed to calculate for the following reasons:   The 2019 ASCVD risk score is only valid for ages 37 to 45   * - Cholesterol units were assumed    Assessment & Plan:   Class 2 obesity due to excess calories without serious comorbidity with body mass index (BMI) of 36.0 to 36.9 in adult  Gastroesophageal reflux disease without esophagitis    Assessment and Plan    Class 2 obesity On Wegovy  2.4 mg, lost 19 pounds, BMI reduced from 36 to 33. Doing well with well-balanced low calorie diet and daily exercise.  - Continue Wegovy  2.4 mg today. - Continue exercise routine: treadmill walking, home cardio daily - Maintain diet: fruits, vegetables, lean meats,  whole grains, reduced red meats and sweets. Low calorie.   Gastroesophageal reflux disease Managed with famotidine  twice daily, significant symptom improvement. Discussed potential PPI use if symptoms worsen. - Continue famotidine  twice daily. - Monitor symptoms, consider omeprazole if worsens.       Return in about 8 weeks (around 08/21/2024) for medication follow up.   The patient indicates understanding of these issues and agrees with the plan.  Annabella CHRISTELLA Search, FNP "

## 2024-08-21 ENCOUNTER — Ambulatory Visit: Admitting: Family Medicine
